# Patient Record
Sex: Female | Born: 1970 | Race: Black or African American | Hispanic: No | Marital: Married | Smoking: Former smoker
Health system: Southern US, Community
[De-identification: ages and names within clinical notes are randomized; demographics above are authoritative.]

## PROBLEM LIST (undated history)

## (undated) DIAGNOSIS — I10 Essential (primary) hypertension: Secondary | ICD-10-CM

## (undated) DIAGNOSIS — E669 Obesity, unspecified: Secondary | ICD-10-CM

## (undated) DIAGNOSIS — Z973 Presence of spectacles and contact lenses: Secondary | ICD-10-CM

## (undated) HISTORY — PX: CHOLECYSTECTOMY: SHX55

## (undated) HISTORY — DX: Obesity, unspecified: E66.9

## (undated) HISTORY — DX: Essential (primary) hypertension: I10

## (undated) HISTORY — PX: TUBAL LIGATION: SHX77

---

## 1997-08-11 ENCOUNTER — Ambulatory Visit (HOSPITAL_COMMUNITY): Admission: RE | Admit: 1997-08-11 | Discharge: 1997-08-12 | Payer: Self-pay | Admitting: Surgery

## 1997-11-15 ENCOUNTER — Emergency Department (HOSPITAL_COMMUNITY): Admission: EM | Admit: 1997-11-15 | Discharge: 1997-11-15 | Payer: Self-pay | Admitting: Emergency Medicine

## 1997-11-18 ENCOUNTER — Emergency Department (HOSPITAL_COMMUNITY): Admission: EM | Admit: 1997-11-18 | Discharge: 1997-11-18 | Payer: Self-pay | Admitting: *Deleted

## 1997-11-23 ENCOUNTER — Encounter: Admission: RE | Admit: 1997-11-23 | Discharge: 1997-11-23 | Payer: Self-pay | Admitting: Internal Medicine

## 1997-12-01 ENCOUNTER — Ambulatory Visit (HOSPITAL_COMMUNITY): Admission: RE | Admit: 1997-12-01 | Discharge: 1997-12-01 | Payer: Self-pay | Admitting: *Deleted

## 1997-12-04 ENCOUNTER — Encounter: Admission: RE | Admit: 1997-12-04 | Discharge: 1997-12-04 | Payer: Self-pay | Admitting: Internal Medicine

## 1998-08-31 ENCOUNTER — Other Ambulatory Visit: Admission: RE | Admit: 1998-08-31 | Discharge: 1998-08-31 | Payer: Self-pay | Admitting: Obstetrics and Gynecology

## 1998-12-17 ENCOUNTER — Encounter: Admission: RE | Admit: 1998-12-17 | Discharge: 1999-03-17 | Payer: Self-pay | Admitting: Obstetrics and Gynecology

## 1999-01-12 ENCOUNTER — Inpatient Hospital Stay (HOSPITAL_COMMUNITY): Admission: AD | Admit: 1999-01-12 | Discharge: 1999-01-12 | Payer: Self-pay | Admitting: Obstetrics and Gynecology

## 1999-02-17 ENCOUNTER — Inpatient Hospital Stay (HOSPITAL_COMMUNITY): Admission: AD | Admit: 1999-02-17 | Discharge: 1999-02-17 | Payer: Self-pay | Admitting: Obstetrics & Gynecology

## 1999-02-19 ENCOUNTER — Inpatient Hospital Stay (HOSPITAL_COMMUNITY): Admission: AD | Admit: 1999-02-19 | Discharge: 1999-02-22 | Payer: Self-pay | Admitting: Obstetrics and Gynecology

## 1999-02-19 ENCOUNTER — Encounter (INDEPENDENT_AMBULATORY_CARE_PROVIDER_SITE_OTHER): Payer: Self-pay

## 2000-01-23 ENCOUNTER — Ambulatory Visit (HOSPITAL_COMMUNITY): Admission: RE | Admit: 2000-01-23 | Discharge: 2000-01-23 | Payer: Self-pay | Admitting: Infectious Diseases

## 2000-01-23 ENCOUNTER — Encounter: Payer: Self-pay | Admitting: Infectious Diseases

## 2000-07-24 ENCOUNTER — Encounter: Admission: RE | Admit: 2000-07-24 | Discharge: 2000-07-24 | Payer: Self-pay | Admitting: Family Medicine

## 2000-07-31 ENCOUNTER — Encounter: Admission: RE | Admit: 2000-07-31 | Discharge: 2000-07-31 | Payer: Self-pay | Admitting: Sports Medicine

## 2000-07-31 ENCOUNTER — Encounter: Payer: Self-pay | Admitting: Sports Medicine

## 2000-08-11 ENCOUNTER — Encounter: Payer: Self-pay | Admitting: Sports Medicine

## 2000-08-11 ENCOUNTER — Encounter: Admission: RE | Admit: 2000-08-11 | Discharge: 2000-08-11 | Payer: Self-pay | Admitting: Sports Medicine

## 2005-04-24 ENCOUNTER — Emergency Department (HOSPITAL_COMMUNITY): Admission: EM | Admit: 2005-04-24 | Discharge: 2005-04-24 | Payer: Self-pay | Admitting: Family Medicine

## 2005-06-10 ENCOUNTER — Ambulatory Visit: Payer: Self-pay | Admitting: Family Medicine

## 2005-06-17 ENCOUNTER — Encounter: Admission: RE | Admit: 2005-06-17 | Discharge: 2005-06-17 | Payer: Self-pay | Admitting: Sports Medicine

## 2005-07-02 ENCOUNTER — Ambulatory Visit: Payer: Self-pay | Admitting: Sports Medicine

## 2005-07-10 ENCOUNTER — Ambulatory Visit: Payer: Self-pay | Admitting: Family Medicine

## 2005-07-18 ENCOUNTER — Encounter: Admission: RE | Admit: 2005-07-18 | Discharge: 2005-07-18 | Payer: Self-pay | Admitting: Family Medicine

## 2005-07-23 ENCOUNTER — Ambulatory Visit: Payer: Self-pay | Admitting: Sports Medicine

## 2006-02-25 ENCOUNTER — Emergency Department (HOSPITAL_COMMUNITY): Admission: EM | Admit: 2006-02-25 | Discharge: 2006-02-25 | Payer: Self-pay | Admitting: Emergency Medicine

## 2006-02-28 ENCOUNTER — Emergency Department (HOSPITAL_COMMUNITY): Admission: EM | Admit: 2006-02-28 | Discharge: 2006-02-28 | Payer: Self-pay | Admitting: Family Medicine

## 2006-05-01 ENCOUNTER — Ambulatory Visit: Payer: Self-pay | Admitting: Family Medicine

## 2006-05-14 DIAGNOSIS — I10 Essential (primary) hypertension: Secondary | ICD-10-CM

## 2006-05-14 DIAGNOSIS — E1165 Type 2 diabetes mellitus with hyperglycemia: Secondary | ICD-10-CM

## 2006-05-14 DIAGNOSIS — E119 Type 2 diabetes mellitus without complications: Secondary | ICD-10-CM | POA: Insufficient documentation

## 2006-05-14 DIAGNOSIS — E669 Obesity, unspecified: Secondary | ICD-10-CM

## 2006-05-27 LAB — CONVERTED CEMR LAB: Pap Smear: NORMAL

## 2006-07-01 ENCOUNTER — Encounter: Admission: RE | Admit: 2006-07-01 | Discharge: 2006-07-01 | Payer: Self-pay | Admitting: Sports Medicine

## 2006-07-03 ENCOUNTER — Encounter (INDEPENDENT_AMBULATORY_CARE_PROVIDER_SITE_OTHER): Payer: Self-pay | Admitting: *Deleted

## 2006-08-14 ENCOUNTER — Ambulatory Visit: Payer: Self-pay | Admitting: Family Medicine

## 2006-08-25 ENCOUNTER — Telehealth: Payer: Self-pay | Admitting: *Deleted

## 2006-08-25 ENCOUNTER — Ambulatory Visit: Payer: Self-pay | Admitting: Family Medicine

## 2006-08-28 ENCOUNTER — Ambulatory Visit: Payer: Self-pay | Admitting: Family Medicine

## 2006-08-31 ENCOUNTER — Telehealth: Payer: Self-pay | Admitting: *Deleted

## 2006-08-31 ENCOUNTER — Ambulatory Visit: Payer: Self-pay | Admitting: Family Medicine

## 2006-08-31 ENCOUNTER — Encounter: Payer: Self-pay | Admitting: *Deleted

## 2006-08-31 ENCOUNTER — Encounter (INDEPENDENT_AMBULATORY_CARE_PROVIDER_SITE_OTHER): Payer: Self-pay | Admitting: *Deleted

## 2006-08-31 LAB — CONVERTED CEMR LAB
ALT: <8 units/L (ref 0–35)
BUN: 9 mg/dL (ref 6–23)
CO2: 21 meq/L (ref 19–32)
Calcium: 8.9 mg/dL (ref 8.4–10.5)
Chloride: 104 meq/L (ref 96–112)
Creatinine, Ser: 0.72 mg/dL (ref 0.40–1.20)
Glucose, Bld: 193 mg/dL — ABNORMAL HIGH (ref 70–99)
LDL Cholesterol: 95 mg/dL (ref 0–99)
Potassium: 4.2 meq/L (ref 3.5–5.3)
TSH: 0.797 microintl units/mL (ref 0.350–5.50)
Total Bilirubin: 0.3 mg/dL (ref 0.3–1.2)
Total CHOL/HDL Ratio: 3.6
VLDL: 21 mg/dL (ref 0–40)

## 2006-09-01 ENCOUNTER — Encounter (INDEPENDENT_AMBULATORY_CARE_PROVIDER_SITE_OTHER): Payer: Self-pay | Admitting: *Deleted

## 2006-09-02 ENCOUNTER — Telehealth: Payer: Self-pay | Admitting: *Deleted

## 2006-09-08 ENCOUNTER — Encounter: Payer: Self-pay | Admitting: Vascular Surgery

## 2006-09-08 ENCOUNTER — Ambulatory Visit (HOSPITAL_COMMUNITY): Admission: RE | Admit: 2006-09-08 | Discharge: 2006-09-08 | Payer: Self-pay | Admitting: Sports Medicine

## 2006-09-09 ENCOUNTER — Encounter (INDEPENDENT_AMBULATORY_CARE_PROVIDER_SITE_OTHER): Payer: Self-pay | Admitting: *Deleted

## 2007-04-29 ENCOUNTER — Emergency Department (HOSPITAL_COMMUNITY): Admission: EM | Admit: 2007-04-29 | Discharge: 2007-04-29 | Payer: Self-pay | Admitting: Family Medicine

## 2007-09-23 ENCOUNTER — Ambulatory Visit: Payer: Self-pay | Admitting: Family Medicine

## 2007-10-06 ENCOUNTER — Ambulatory Visit: Payer: Self-pay | Admitting: Family Medicine

## 2007-10-07 ENCOUNTER — Encounter: Payer: Self-pay | Admitting: Sports Medicine

## 2007-10-18 ENCOUNTER — Encounter: Payer: Self-pay | Admitting: *Deleted

## 2007-11-25 ENCOUNTER — Emergency Department (HOSPITAL_COMMUNITY): Admission: EM | Admit: 2007-11-25 | Discharge: 2007-11-25 | Payer: Self-pay | Admitting: Family Medicine

## 2007-11-30 ENCOUNTER — Encounter: Payer: Self-pay | Admitting: *Deleted

## 2008-01-24 ENCOUNTER — Emergency Department (HOSPITAL_COMMUNITY): Admission: EM | Admit: 2008-01-24 | Discharge: 2008-01-24 | Payer: Self-pay | Admitting: Family Medicine

## 2008-01-24 LAB — CONVERTED CEMR LAB
Creatinine, Ser: 0.8 mg/dL
Potassium: 3.8 meq/L

## 2008-01-26 ENCOUNTER — Telehealth: Payer: Self-pay | Admitting: *Deleted

## 2008-01-27 ENCOUNTER — Encounter: Payer: Self-pay | Admitting: *Deleted

## 2008-01-27 ENCOUNTER — Ambulatory Visit: Payer: Self-pay | Admitting: Family Medicine

## 2008-01-27 ENCOUNTER — Ambulatory Visit (HOSPITAL_COMMUNITY): Admission: RE | Admit: 2008-01-27 | Discharge: 2008-01-27 | Payer: Self-pay | Admitting: Family Medicine

## 2008-01-27 ENCOUNTER — Encounter: Payer: Self-pay | Admitting: Sports Medicine

## 2008-01-27 LAB — CONVERTED CEMR LAB
Cholesterol: 180 mg/dL (ref 0–200)
HDL: 48 mg/dL (ref >39)
Hgb A1c MFr Bld: 10.5 %
LDL Cholesterol: 110 mg/dL — ABNORMAL HIGH (ref 0–99)
Microalbumin U total vol: NORMAL mg/L
Total CHOL/HDL Ratio: 3.8
Triglycerides: 110 mg/dL (ref <150)
VLDL: 22 mg/dL (ref 0–40)

## 2008-02-04 ENCOUNTER — Ambulatory Visit: Payer: Self-pay | Admitting: Family Medicine

## 2008-02-04 ENCOUNTER — Encounter: Payer: Self-pay | Admitting: Sports Medicine

## 2008-03-06 ENCOUNTER — Encounter: Payer: Self-pay | Admitting: Sports Medicine

## 2008-03-06 DIAGNOSIS — E785 Hyperlipidemia, unspecified: Secondary | ICD-10-CM

## 2008-09-01 ENCOUNTER — Emergency Department (HOSPITAL_COMMUNITY): Admission: EM | Admit: 2008-09-01 | Discharge: 2008-09-01 | Payer: Self-pay | Admitting: Family Medicine

## 2008-12-11 ENCOUNTER — Telehealth: Payer: Self-pay | Admitting: Sports Medicine

## 2008-12-21 ENCOUNTER — Ambulatory Visit: Payer: Self-pay | Admitting: Family Medicine

## 2008-12-21 ENCOUNTER — Encounter: Payer: Self-pay | Admitting: Sports Medicine

## 2008-12-21 LAB — CONVERTED CEMR LAB: Hgb A1c MFr Bld: 9.9 %

## 2009-01-03 ENCOUNTER — Encounter: Payer: Self-pay | Admitting: *Deleted

## 2009-01-03 ENCOUNTER — Ambulatory Visit: Payer: Self-pay | Admitting: Family Medicine

## 2009-05-31 ENCOUNTER — Encounter: Payer: Self-pay | Admitting: Sports Medicine

## 2009-12-07 ENCOUNTER — Encounter: Payer: Self-pay | Admitting: Family Medicine

## 2009-12-07 ENCOUNTER — Ambulatory Visit: Payer: Self-pay | Admitting: Family Medicine

## 2009-12-07 LAB — CONVERTED CEMR LAB
Epithelial cells, urine: <5 /lpf
Glucose, Urine, Semiquant: 500
Protein, U semiquant: 100

## 2009-12-09 LAB — CONVERTED CEMR LAB
Basophils Absolute: 0 10*3/uL (ref 0.0–0.1)
Basophils Relative: 0 % (ref 0–1)
CO2: 24 meq/L (ref 19–32)
Chloride: 101 meq/L (ref 96–112)
Creatinine, Ser: 0.72 mg/dL (ref 0.40–1.20)
Eosinophils Absolute: 0 10*3/uL (ref 0.0–0.7)
Eosinophils Relative: 0 % (ref 0–5)
Lymphocytes Relative: 16 % (ref 12–46)
MCV: 74.6 fL — ABNORMAL LOW (ref 78.0–100.0)
Monocytes Absolute: 0.6 10*3/uL (ref 0.1–1.0)
Monocytes Relative: 7 % (ref 3–12)
Neutrophils Relative %: 76 % (ref 43–77)
Platelets: 213 10*3/uL (ref 150–400)
RDW: 16.4 % — ABNORMAL HIGH (ref 11.5–15.5)
WBC: 7.9 10*3/uL (ref 4.0–10.5)

## 2009-12-10 ENCOUNTER — Telehealth: Payer: Self-pay | Admitting: Sports Medicine

## 2009-12-10 ENCOUNTER — Encounter: Payer: Self-pay | Admitting: Family Medicine

## 2009-12-10 ENCOUNTER — Telehealth: Payer: Self-pay | Admitting: *Deleted

## 2009-12-10 ENCOUNTER — Ambulatory Visit: Payer: Self-pay | Admitting: Family Medicine

## 2009-12-10 ENCOUNTER — Telehealth: Payer: Self-pay | Admitting: Family Medicine

## 2010-01-21 ENCOUNTER — Encounter: Payer: Self-pay | Admitting: Sports Medicine

## 2010-02-01 ENCOUNTER — Ambulatory Visit: Payer: Self-pay | Admitting: Family Medicine

## 2010-02-01 ENCOUNTER — Encounter: Payer: Self-pay | Admitting: Sports Medicine

## 2010-02-01 DIAGNOSIS — N760 Acute vaginitis: Secondary | ICD-10-CM | POA: Insufficient documentation

## 2010-02-01 DIAGNOSIS — N912 Amenorrhea, unspecified: Secondary | ICD-10-CM

## 2010-02-01 LAB — CONVERTED CEMR LAB
ALT: 9 units/L (ref 0–35)
AST: 9 units/L (ref 0–37)
Albumin: 4.2 g/dL (ref 3.5–5.2)
Alkaline Phosphatase: 140 units/L — ABNORMAL HIGH (ref 39–117)
BUN: 19 mg/dL (ref 6–23)
Beta hcg, urine, semiquantitative: NEGATIVE
Bilirubin Urine: NEGATIVE
Blood in Urine, dipstick: NEGATIVE
CO2: 25 meq/L (ref 19–32)
Calcium: 9.6 mg/dL (ref 8.4–10.5)
Chloride: 95 meq/L — ABNORMAL LOW (ref 96–112)
Creatinine, Ser: 0.83 mg/dL (ref 0.40–1.20)
DHEA-SO4: 110 ug/dL (ref 35–430)
Direct LDL: 110 mg/dL — ABNORMAL HIGH
Estradiol: 44.2 pg/mL
FSH: 5 m[IU]/mL
Glucose, Bld: 479 mg/dL — ABNORMAL HIGH (ref 70–99)
Glucose, Urine, Semiquant: 500
HCT: 36.9 % (ref 36.0–46.0)
Hemoglobin: 12 g/dL (ref 12.0–15.0)
Hgb A1c MFr Bld: 13.3 %
Ketones, urine, test strip: NEGATIVE
LH: 5.5 m[IU]/mL
MCHC: 32.5 g/dL (ref 30.0–36.0)
MCV: 75.8 fL — ABNORMAL LOW (ref 78.0–100.0)
Nitrite: NEGATIVE
Pap Smear: NEGATIVE
Platelets: 320 10*3/uL (ref 150–400)
Potassium: 4.1 meq/L (ref 3.5–5.3)
Progesterone: 2.8 ng/mL
Protein, U semiquant: NEGATIVE
RBC: 4.87 M/uL (ref 3.87–5.11)
RDW: 16.3 % — ABNORMAL HIGH (ref 11.5–15.5)
Sodium: 131 meq/L — ABNORMAL LOW (ref 135–145)
Specific Gravity, Urine: 1.015
TSH: 0.841 microintl units/mL (ref 0.350–4.500)
Testosterone: 26.26 ng/dL (ref 10–70)
Total Bilirubin: 0.3 mg/dL (ref 0.3–1.2)
Total Protein: 7.8 g/dL (ref 6.0–8.3)
Urobilinogen, UA: 0.2
WBC Urine, dipstick: NEGATIVE
WBC: 8.2 10*3/uL (ref 4.0–10.5)
Whiff Test: NEGATIVE
pH: 5.5

## 2010-02-06 ENCOUNTER — Encounter: Admission: RE | Admit: 2010-02-06 | Discharge: 2010-02-06 | Payer: Self-pay | Admitting: Sports Medicine

## 2010-02-14 ENCOUNTER — Ambulatory Visit: Payer: Self-pay | Admitting: Family Medicine

## 2010-02-14 DIAGNOSIS — N841 Polyp of cervix uteri: Secondary | ICD-10-CM

## 2010-02-20 ENCOUNTER — Encounter
Admission: RE | Admit: 2010-02-20 | Discharge: 2010-02-20 | Payer: Self-pay | Source: Home / Self Care | Admitting: Sports Medicine

## 2010-02-27 ENCOUNTER — Ambulatory Visit: Payer: Self-pay

## 2010-04-03 ENCOUNTER — Encounter: Payer: Self-pay | Admitting: Sports Medicine

## 2010-04-18 NOTE — Letter (Signed)
Summary: Out of Work  [facility]  [address]   [place], [place] [address]   Phone: [phone]  Fax: [phone]    December 07, 2009   Employee:  Virginia [name]    To Whom It May Concern:   For Medical reasons, please excuse the above named employee from work for the following dates:  Start:   12-07-09    End:   12-10-09  If you need additional information, please feel free to contact our office.         Sincerely,    Jamie Brookes MD

## 2010-04-18 NOTE — Letter (Signed)
Summary: Generic Letter  [facility]  [address]   [place], [place] [address]   Phone: [phone]  Fax: [phone]    05/31/2009  Breahna Ferrell [address] [place], [place]  [address]  Dear Ms. Langill,   You are overdue for some preventive medical tests.  Please make an appt to see me as soon as is convenient for you.     Sincerely,   Rodney Langton MD

## 2010-04-18 NOTE — Letter (Signed)
Summary: Out of Work  [facility]  [address]   [place], [place] [address]   Phone: [phone]  Fax: [phone]    December 10, 2009   Employee:  Nakeya C Lukas    To Whom It May Concern:   For Medical reasons, please excuse the above named employee from work for the following dates:  Start:  December 10, 2009    End:   December 17, 2009   If you need additional information, please feel free to contact our office.         Sincerely,    Ellery Plunk MD

## 2010-04-18 NOTE — Progress Notes (Signed)
Summary: re: urine  ---- Converted from flag ---- ---- 12/09/2009 3:56 PM, Jamie Brookes MD wrote: Please let this patient know that she is growing E. coli in her urine and is on a good medicine for that so continue taking her full dose of the antibiotic. Thank you. ------------------------------  informed pt at visit today

## 2010-04-18 NOTE — Consult Note (Signed)
Summary: [name] Gl[facility]  [facility]   Imported By: Knox Royalty 04/11/2010 10:58:22  _____________________________________________________________________  External Attachment:    Type:   Image     Comment:   External Document

## 2010-04-18 NOTE — Progress Notes (Signed)
Summary: triage   Phone Note Call from Patient Call back at Home Phone [phone]   Caller: Patient Summary of Call: Pt seen Friday for UTI and now back and her bottom of her stomach is hurting really bad. Initial call taken by: Clydell Hakim,  December 10, 2009 9:19 AM  Follow-up for Phone Call        states she is feeling better but still has " soreness"  in "midsection & back' temp 98.3. has been taking tylenol & ibu all weekend. is in bed in a lot of pain. she will come in now. aware of wait time Follow-up by: Golden Circle RN,  December 10, 2009 9:28 AM  Additional Follow-up for Phone Call Additional follow up Details #1::        Seen today. Additional Follow-up by: Rodney Langton MD,  December 10, 2009 1:45 PM

## 2010-04-18 NOTE — Assessment & Plan Note (Signed)
Summary: colpo per dr neal/eo   Vital Signs:  Patient profile:   40 year old female Height:      66 inches Weight:      233.1 pounds BMI:     37.76 Temp:     98.0 degrees F oral Pulse rate:   78 / minute BP sitting:   125 / 80  (left arm) Cuff size:   large  Vitals Entered By: Garen Grams LPN (February 14, 2010 8:49 AM) CC: colpo Is Patient Diabetic? Yes Did you bring your meter with you today? No Pain Assessment Patient in pain? no        Primary Care Provider:  Rodney Langton MD  CC:  colpo.  History of Present Illness: 40 yo female here for f/u of recent vaginal ultrasounds and to discuss results. Pt reports that menstration has resumed, she had skipped one period last month.  Her period started on 02-07-10, normal flow.   Reviewed transvaginal u/s from 02-06-10 and a small posterior fibroid was noted, as well as a normal endometrium, normal right ovary and a non-visulaized left ovary.  Pt denies abd pain, no abnormal bleeding.  Had a tubal ligation about 12 years ago.  No family hx of cervical cancer.  No abn ormal paps.  Pap from 02-01-10 was NEGATIVE FOR INTRAEPITHELIAL LESIONS OR MALIGNANCY.  Habits & Providers  Alcohol-Tobacco-Diet     Tobacco Status: quit     Year Quit: 2003  Current Medications (verified): 1)  Lisinopril-Hydrochlorothiazide 20-25 Mg Tabs (Lisinopril-Hydrochlorothiazide) .... One Tab By Mouth Daily 2)  Glipizide 10 Mg Tabs (Glipizide) .... One Tab By Mouth Daily 3)  Adult Aspirin Ec Low Strength 81 Mg  Tbec (Aspirin) .... One Tab By Mouth Daily For Heart Protection. 4)  Meclizine Hcl 25 Mg Tabs (Meclizine Hcl) .Marland Kitchen.. 1 Tab By Mouth Two Times A Day 5)  Amlodipine Besylate 5 Mg Tabs (Amlodipine Besylate) .... One Tab By Mouth Daily  Allergies: 1)  ! * Shellfish  Review of Systems  The patient denies fever and weight loss.         no more amenorrhea  Physical Exam  General:  alert and overweight-appearing.   Genitalia:  normal  introitus, no external lesions, no vaginal discharge, and mucosa pink and moist.  typical appearing cervical polyp.   Impression & Recommendations:  Problem # 1:  CERVICAL POLYP (ICD-622.7)  typical in appearance--we discussed extensively spending > 50% 40 minute ov in education and counseling re polyps patient did not desire removal  Orders: [facility]- Est  Level 4 (91478)  Problem # 2:  AMENORRHEA (ICD-626.0) Assessment: Improved  resolved  Orders: [facility]- Est  Level 4 (29562)  Complete Medication List: 1)  Lisinopril-hydrochlorothiazide 20-25 Mg Tabs (Lisinopril-hydrochlorothiazide) .... One tab by mouth daily 2)  Glipizide 10 Mg Tabs (Glipizide) .... One tab by mouth daily 3)  Adult Aspirin Ec Low Strength 81 Mg Tbec (Aspirin) .... One tab by mouth daily for heart protection. 4)  Meclizine Hcl 25 Mg Tabs (Meclizine hcl) .Marland Kitchen.. 1 tab by mouth two times a day 5)  Amlodipine Besylate 5 Mg Tabs (Amlodipine besylate) .... One tab by mouth daily   Orders Added: 1)  [facility]- Est  Level 4 [13086]

## 2010-04-18 NOTE — Assessment & Plan Note (Signed)
 Summary: cpe/kh   Vital Signs:  Patient Profile:   40 Years Old Female Height:     66 inches Weight:      258 pounds Temp:     98.3 degrees F Pulse rate:   97 / minute BP sitting:   149 / 77  Pt. in pain?   yes    Location:   left heel    Intensity:   5  Vitals Entered By: HARLENE CARTE CMA (October 06, 2007 2:48 PM)                  PCP:  DEBBY PETTIES MD  Chief Complaint:  CPE.  History of Present Illness: Pt has pain in left heel, increasing since pt started a new exercise regimen of powerwalking 6 laps around a 1/4 mile track 4 times per week (started in May 2009).  Pain is prominent on lateral, medial, and posterior heel, but not on bottom.  Exacerbated by walking in the morning but not by plantarflexing with weight bearing.  Improved with Ibuprofin.  Pt was also scheduled for CPE and pap smear(last pap 2 years ago, normal) but had to leave to pick up her children from school before this could be done.  I instructed pt to reschedule to see me next week.     Past Medical History:    Anemia with low MCV    No history of STDs or abnormal Paps    HTN    DMII   Family History:    2 older brothers are healthy, Father - HTN, lipids, Mother - Breast CA, DM, CHF, Htn, osteoporosis    Review of Systems       As above in HPI   Physical Exam  Extremities:     Left ankle pain laterally, medially, and posteriorly, non-swollen, good ROM, no deformities.    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  Pt will have Xray 10/07/07 and will see me in one week to discuss results.   Orders: Diagnostic X-Ray/Fluoroscopy (Diagnostic X-Ray/Flu) [facility]- Est Level  3 (00786)   Problem # 2:  DIABETES MELLITUS II, UNCOMPLICATED (ICD-250.00) Added ASA 81 mg by mouth daily for cardioprotection Will do Urine microalbumin, A1c, lipid profile, ECG and have patient see me next week  to discuss results.  If high, will discuss dieting, weight loss, and possible medication changes.   Her updated medication list for this problem includes:    Lisinopril  20 Mg Tabs (Lisinopril ) .SABRA... 1 tablet by mouth daily no more refills until seen at [facility]    Glipizide -metformin  Hcl 5-500 Mg Tabs (Glipizide -metformin  hcl) ..... One tab by mouth two times a day  no more refills until seen at [facility]    Adult Aspirin Ec Low Strength 81 Mg Tbec (Aspirin) ..... One tab by mouth daily for heart protection.  Future Orders: A1C-[facility] (16963) ... 10/07/2007 UA Microalbumin-[facility] (17955) ... 10/07/2007 Lipid-[facility] (19938-77069) ... 10/07/2007 12 Lead EKG (12 Lead EKG) ... 10/07/2007   Problem # 3:  HYPERTENSION, BENIGN SYSTEMIC (ICD-401.1) I will see the patient next week to discuss her high blood pressure.  We will discuss diet, weight loss, and medications to control pressure.   Her updated medication list for this problem includes:    Lisinopril  20 Mg Tabs (Lisinopril ) .SABRA... 1 tablet by mouth daily no more refills until seen at [facility]   Complete Medication List: 1)  Lisinopril   20 Mg Tabs (Lisinopril ) .SABRA.. 1 tablet by mouth daily no more refills until seen at [facility] 2)  Glipizide -metformin  Hcl 5-500 Mg Tabs (Glipizide -metformin  hcl) .... One tab by mouth two times a day  no more refills until seen at [facility] 3)  Adult Aspirin Ec Low Strength 81 Mg Tbec (Aspirin) .... One tab by mouth daily for heart protection.  Diabetes Management Assessment/Plan:      The following lipid goals have been established for the patient: Total cholesterol goal of 200; LDL cholesterol goal of 100; HDL cholesterol goal of 40; Triglyceride goal of 200.     Patient Instructions: 1)  It was good to meet you today.  I would like you to make an appointment to see me next week, please let the office staff know its ok to double book.  I will do your complete physical then.  I would also like you to make an appointment to have your blood  drawn tomorrow for hemoglobin A1C and to have an X-ray of your foot/ankle 3-view.     Prescriptions: ADULT ASPIRIN EC LOW STRENGTH 81 MG  TBEC (ASPIRIN) One tab by mouth daily for heart protection.  #30 x 3   Entered and Authorized by:   DEBBY PETTIES MD   Signed by:   DEBBY PETTIES MD on 10/06/2007   Method used:   Electronically sent to ...       [id number]       [address]       [place], [place]  [address]       Ph: [phone] or [phone]       Fax: [phone]   RxID:   [id number]  ]

## 2010-04-18 NOTE — Letter (Signed)
Summary: Out of Work  [facility]  [address]   [place], [place] [address]   Phone: [phone]  Fax: [phone]    December 10, 2009   Employee:  [name]    To Whom It May Concern:   For Medical reasons, please excuse the above named employee from work for the following dates:  Start:  December 10, 2009   End:  December 14, 2009   If you need additional information, please feel free to contact our office.         Sincerely,    Ellery Plunk MD

## 2010-04-18 NOTE — Assessment & Plan Note (Signed)
Summary: UTI, DM2   Vital Signs:  Patient profile:   40 year old female Weight:      239.7 pounds BMI:     38.83 Temp:     99.6 degrees F oral Pulse rate:   110 / minute Pulse rhythm:   regular BP sitting:   155 / 87  (right arm) Cuff size:   large  Vitals Entered By: Loralee Pacas CMA (December 07, 2009 10:18 AM)  Primary Care Provider:  Rodney Langton MD  CC:  body aches.  History of Present Illness: Body aches: Pt has had body aches for the last 1 day. She had a fever last night of 100.3. She took ibuprofen before she went to bed but this morning she woke up feeling sick still. She has no sick contacts. No new meds. she is having very dark urine and is not drinking much for the last 1 day. She has been feeling lightheaded, nauseated but has not vomited.   Habits & Providers  Alcohol-Tobacco-Diet     Tobacco Status: current  Current Medications (verified): 1)  Lisinopril-Hydrochlorothiazide 20-25 Mg Tabs (Lisinopril-Hydrochlorothiazide) .... One Tab By Mouth Daily 2)  Glipizide 10 Mg Tabs (Glipizide) .... One Tab By Mouth Daily 3)  Adult Aspirin Ec Low Strength 81 Mg  Tbec (Aspirin) .... One Tab By Mouth Daily For Heart Protection. 4)  Meclizine Hcl 25 Mg Tabs (Meclizine Hcl) .Marland Kitchen.. 1 Tab By Mouth Two Times A Day 5)  Amlodipine Besylate 5 Mg Tabs (Amlodipine Besylate) .... One Tab By Mouth Daily 6)  Keflex 500 Mg Caps (Cephalexin) .... Take 1 Pill Twice Daily For 10 Days.  Allergies (verified): 1)  ! * Shellfish  Social History: Smoking Status:  current  Review of Systems        vitals reviewed and pertinent negatives and positives seen in HPI   Physical Exam  General:  Well-developed,well-nourished,pt appears ill; alert,appropriate and cooperative throughout examination Abdomen:  Bowel sounds positive,abdomen soft and non-tender without masses, organomegaly or hernias noted. Msk:  slight CVA tenderness on bilateral sides.    Impression &  Recommendations:  Problem # 1:  DYSURIA (ICD-788.1) Assessment New Concern for UTI vs Pyelo vs viral influenza infection or a combination of those. UA was impressive for a UTI, Urine is being sent for culture. Pt is menstrating so there is blood in the urine. Plan to get CBC to see if she has a WBC elevated more consistant with Pyelo. Pt is to come back on MOnday if not feeling better or call the after hours line over the weekend.  If pt will having body aches and pain even while on the ABx then I would think she had a viral influenza as well. She has not gotten a flu shot this year.   Her updated medication list for this problem includes:    Keflex 500 Mg Caps (Cephalexin) .Marland Kitchen... Take 1 pill twice daily for 10 days.  Orders: Urinalysis-[facility] (00000) CBC w/Diff-[facility] (04540) Basic Met-[facility] ([address]8119-14782) Urine Culture-[facility] ([address]21-30865) [facility]- Est  Level 4 (78469)  Problem # 2:  DIABETES MELLITUS II, UNCOMPLICATED (ICD-250.00) Assessment: Deteriorated   Pt is a diabetic who has not been taking her meds the last few days. Her blood glucose was 300's so I don't believe she is in a dangerous zone but encouraged to start back on her DM2 meds.   The following medications were removed from the medication list:    Metformin Hcl 1000 Mg Tabs (Metformin hcl) ..... One tab by  mouth two times a day Her updated medication list for this problem includes:    Lisinopril-hydrochlorothiazide 20-25 Mg Tabs (Lisinopril-hydrochlorothiazide) ..... One tab by mouth daily    Glipizide 10 Mg Tabs (Glipizide) ..... One tab by mouth daily    Adult Aspirin Ec Low Strength 81 Mg Tbec (Aspirin) ..... One tab by mouth daily for heart protection.  Orders: Glucose Cap-[facility] ([address]) [facility]- Est  Level 4 (60454)  Complete Medication List: 1)  Lisinopril-hydrochlorothiazide 20-25 Mg Tabs (Lisinopril-hydrochlorothiazide) .... One tab by mouth daily 2)  Glipizide 10 Mg Tabs (Glipizide) .... One tab by mouth daily 3)  Adult Aspirin  Ec Low Strength 81 Mg Tbec (Aspirin) .... One tab by mouth daily for heart protection. 4)  Meclizine Hcl 25 Mg Tabs (Meclizine hcl) .Marland Kitchen.. 1 tab by mouth two times a day 5)  Amlodipine Besylate 5 Mg Tabs (Amlodipine besylate) .... One tab by mouth daily 6)  Keflex 500 Mg Caps (Cephalexin) .... Take 1 pill twice daily for 10 days.  Patient Instructions: 1)  Call Monday to set up appointment if no better.  2)  We will call you with lab results.  Prescriptions: GLIPIZIDE 10 MG TABS (GLIPIZIDE) One tab by mouth daily  #90 x 0   Entered and Authorized by:   Jamie Brookes MD   Signed by:   Jamie Brookes MD on 12/07/2009   Method used:   Electronically to        [facility] [address] (retail)       [address]       [place], [place]  [id number]       Ph: [phone]       Fax: [phone]   RxID:   [id number] KEFLEX 500 MG CAPS (CEPHALEXIN) take 1 pill twice daily for 10 days.  #20 x 0   Entered and Authorized by:   Jamie Brookes MD   Signed by:   Jamie Brookes MD on 12/07/2009   Method used:   Electronically to        [facility] [address] (retail)       [address]       [place], [place]  [id number]       Ph: [phone]       Fax: [phone]   RxID:   [id number]   Laboratory Results   Urine Tests  Date/Time Received: December 07, 2009 11:44 AM  Date/Time Reported: December 07, 2009 11:57 AM   Routine Urinalysis   Color: yellow Appearance: Cloudy Glucose: 500   (Normal Range: Negative) Bilirubin: negative   (Normal Range: Negative) Ketone: >= (160)   (Normal Range: Negative) Spec. Gravity: 1.025   (Normal Range: 1.003-1.035) Blood: large   (Normal Range: Negative) pH: 5.5   (Normal Range: 5.0-8.0) Protein: 100   (Normal Range: Negative) Urobilinogen: 1.0   (Normal Range: 0-1) Nitrite: positive   (Normal Range: Negative) Leukocyte Esterace: moderate   (Normal Range: Negative)  Urine Microscopic WBC/HPF:  LOADED RBC/HPF: 10-20 Bacteria/HPF: 3+ Epithelial/HPF: <5    Comments: ...............test performed by......Marland KitchenBonnie A. [place], MLS (ASCP)cm

## 2010-04-18 NOTE — Progress Notes (Signed)
Summary: Note Needed   Phone Note Call from Patient Call back at Home Phone [phone]   Caller: Patient Summary of Call: Saw Dr. Hulen Luster today and wondering if the note that she wrote her out for till Friday could be changed until Monday.   Initial call taken by: Clydell Hakim,  December 10, 2009 12:29 PM  Follow-up for Phone Call        new note written Follow-up by: Ellery Plunk MD,  December 10, 2009 1:40 PM

## 2010-04-18 NOTE — Assessment & Plan Note (Signed)
 Summary: f/u meds,df   Vital Signs:  Patient profile:   40 year old female Weight:      258.0 pounds Temp:     98.7 degrees F oral Pulse rate:   88 / minute BP sitting:   163 / 95  (left arm)  Vitals Entered By: Letitia Reusing (December 21, 2008 8:32 AM) CC: f/u meds Is Patient Diabetic? Yes   Primary Care Provider:  DEBBY PETTIES MD  CC:  f/u meds.  History of Present Illness: 22F with HTN, DM2, Vertigo, obesity here for follow up.  Dizziness: Resolved, no longer needs meclizine   HTN:  Deteriorated.  Has been taking meds per pt.  DM2: HbA1c 10.5 at last visit, 9.9 today.  Taking meds per pt.  Obesity:  has gained 4 lbs since last visit.  PAP:  Due, last 2 yrs ago, nl per pt.   Habits & Providers  Alcohol -Tobacco-Diet     Tobacco Status: never  Past History:  Past Medical History: Last updated: 02/04/2008 Anemia with low MCV No history of STDs or abnormal Paps HTN DMII Benign Paroxysmal Positional Vertigo  Past Surgical History: Last updated: 05/14/2006 BTL (2000) -, Cholecystectomy (1994) -  Family History: Last updated: 10/06/2007 2 older brothers are healthy, Father - HTN, lipids, Mother - Breast CA, DM, CHF, Htn, osteoporosis  Social History: Last updated: 08/14/2006 Lives with husband and 2 children.  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  No ETOH.  Quit smoking in 2004 after a 10 yr history.  Social History: Smoking Status:  never  Review of Systems       See HPI  Physical Exam  General:  Well-developed,well-nourished,in no acute distress; alert,appropriate and cooperative throughout examination Lungs:  Normal respiratory effort, chest expands symmetrically. Lungs are clear to auscultation, no crackles or wheezes. Heart:  Normal rate and regular rhythm. S1 and S2 normal without gallop, murmur, click, rub or other extra sounds. Genitalia:  Normal introitus for age, no external lesions, no vaginal discharge, some clots seen at os, pt  states she should be starting her cycle soon, mucosa pink and moist, no vaginal or cervical lesions, no vaginal atrophy, no friaility or hemorrhage, normal uterus size and position, no adnexal masses or tenderness   Impression & Recommendations:  Problem # 1:  SCREENING FOR MALIGNANT NEOPLASM OF THE CERVIX (ICD-V76.2) Assessment Comment Only PAP done today.  Orders: Pap Smear-[facility] (11824-02999) [facility]- Est  Level 4 (99214)  Problem # 2:  DIZZINESS (ICD-780.4) Assessment: Improved Resolved.  Her updated medication list for this problem includes:    Meclizine  Hcl 25 Mg Tabs (Meclizine  hcl) .SABRA... 1 tab by mouth two times a day  Problem # 3:  OBESITY, NOS (ICD-278.00) Assessment: Deteriorated Pt to try to eat better.  Also increased metformin  which should help with weight.  Orders: [facility]- Est  Level 4 (00785)  Problem # 4:  HYPERTENSION, BENIGN SYSTEMIC (ICD-401.1) Assessment: Deteriorated Changed to lisinopril  HCTZ combo, RTC 2 weeks for RN BP check, then 3 months to see me.  Can add norvasc  if still uncontrolled.  Her updated medication list for this problem includes:    Lisinopril -hydrochlorothiazide  20-25 Mg Tabs (Lisinopril -hydrochlorothiazide ) ..... One tab by mouth daily  Orders: [facility]- Est  Level 4 (00785)  Problem # 5:  DIABETES MELLITUS II, UNCOMPLICATED (ICD-250.00) Assessment: Improved A1c better but only taking 1000mg  total metformin .  Split glip/met into glip 10 once daily and metformin  1000 two times a day for total 2000 daily dose  metformin . Recheck A1c in 3 months.  Pt agrees to schedule ophtho appt.  RTC 3 months to re-eval.  Her updated medication list for this problem includes:    Lisinopril -hydrochlorothiazide  20-25 Mg Tabs (Lisinopril -hydrochlorothiazide ) ..... One tab by mouth daily    Glipizide  10 Mg Tabs (Glipizide ) ..... One tab by mouth daily    Adult Aspirin Ec Low Strength 81 Mg Tbec (Aspirin) ..... One tab by mouth daily for heart protection.     Metformin  Hcl 1000 Mg Tabs (Metformin  hcl) ..... One tab by mouth two times a day  Orders: A1C-[facility] (16963) [facility]- Est  Level 4 (00785)  Complete Medication List: 1)  Lisinopril -hydrochlorothiazide  20-25 Mg Tabs (Lisinopril -hydrochlorothiazide ) .... One tab by mouth daily 2)  Glipizide  10 Mg Tabs (Glipizide ) .... One tab by mouth daily 3)  Adult Aspirin Ec Low Strength 81 Mg Tbec (Aspirin) .... One tab by mouth daily for heart protection. 4)  Meclizine  Hcl 25 Mg Tabs (Meclizine  hcl) .SABRA.. 1 tab by mouth two times a day 5)  Metformin  Hcl 1000 Mg Tabs (Metformin  hcl) .... One tab by mouth two times a day  Patient Instructions: 1)  Good to see you: 2)  Make eye doctor appt for diabetic exam. 3)  Take new med dosages as rx'ed below. 4)  Come back for Nurse BP check in 2 weeks. 5)  Come back to see me in 3 months, we still haven't controlled your BP or Diabetes. 6)  -Dr. ONEIDA. Prescriptions: METFORMIN  HCL 1000 MG TABS (METFORMIN  HCL) One tab by mouth two times a day  #180 x 6   Entered and Authorized by:   Debby Petties MD   Signed by:   Debby Petties MD on 12/21/2008   Method used:   Faxed to ...       [facility] (retail)       [address] [place]  [address]       [place], [place]  [address]       Ph: [phone] or [phone]       Fax: [phone]   RxID:   [id number] GLIPIZIDE  10 MG TABS (GLIPIZIDE ) One tab by mouth daily  #90 x 6   Entered and Authorized by:   Debby Petties MD   Signed by:   Debby Petties MD on 12/21/2008   Method used:   Faxed to ...       [facility] (retail)       [address] [place]  [address]       [place], [place]  [address]       Ph: [phone] or [phone]       Fax: [phone]   RxID:   [id number] LISINOPRIL -HYDROCHLOROTHIAZIDE  20-25 MG TABS (LISINOPRIL -HYDROCHLOROTHIAZIDE ) One tab by mouth daily  #30 x 0   Entered and Authorized by:   Debby Petties MD   Signed by:   Debby Petties MD on  12/21/2008   Method used:   Faxed to ...       [facility] (retail)       [address] [place]  [address]       [place], [place]  [address]       Ph: [phone] or [phone]       Fax: [phone]   RxID:   [id number]   Last HDL:  48 (01/27/2008 10:11:00 PM) HDL Next Due:  1 yr Last  LDL:  110 (01/27/2008 10:11:00 PM) LDL Next Due:  1 yr  Laboratory Results   Blood Tests   Date/Time Received: December 21, 2008 8:42 AM  Date/Time Reported: December 21, 2008 8:53 AM   HGBA1C: 9.9%   (Normal Range: Non-Diabetic - 3-6%   Control Diabetic - 6-8%)  Comments: ...............test performed by......SABRABonnie A. Jordan, MT (ASCP)        Prevention & Chronic Care Immunizations   Influenza vaccine: Not documented    Tetanus booster: 05/16/2003: Done.   Tetanus booster due: 05/15/2013    Pneumococcal vaccine: given  (11/26/2005)   Pneumococcal vaccine due: None  Other Screening   Pap smear: Due previous entry was an error.  (08/14/2006)   Pap smear due: 08/14/2007   Smoking status: never  (12/21/2008)  Diabetes Mellitus   HgbA1C: 9.9  (12/21/2008)   Hemoglobin A1C due: 03/23/2009    Eye exam: Not documented    Foot exam: yes  (01/27/2008)   High risk foot: Not documented   Foot care education: completed  (01/27/2008)   Foot exam due: 01/26/2009    Urine microalbumin/creatinine ratio: Not documented   Urine microalbumin/cr due: 01/26/2009    Diabetes flowsheet reviewed?: Yes   Progress toward A1C goal: Improved  Lipids   Total Cholesterol: 180  (01/27/2008)   LDL: 110  (01/27/2008)   LDL Direct: Not documented   HDL: 48  (01/27/2008)   Triglycerides: 110  (01/27/2008)   Lipid panel due: 01/26/2009    SGOT (AST): 10  (08/31/2006)   SGPT (ALT): <8 U/L  (08/31/2006)   Alkaline phosphatase: 112  (08/31/2006)   Total bilirubin: 0.3  (08/31/2006)    Lipid flowsheet reviewed?: Yes   Progress toward LDL goal: At goal  Hypertension   Last Blood  Pressure: 163 / 95  (12/21/2008)   Serum creatinine: 0.8  (01/24/2008)   Serum potassium 3.8  (01/24/2008)   Basic metabolic panel due: 01/23/2009    Hypertension flowsheet reviewed?: Yes   Progress toward BP goal: Deteriorated  Self-Management Support :   Personal Goals (by the next clinic visit) :     Personal A1C goal: 7  (12/21/2008)     Personal blood pressure goal: 130/80  (12/21/2008)     Personal LDL goal: 130  (12/21/2008)    Diabetes self-management support: Not documented   Last diabetes self-management training by diabetes educator: completed    Hypertension self-management support: Not documented    Lipid self-management support: Not documented

## 2010-04-18 NOTE — Miscellaneous (Signed)
   Clinical Lists Changes  Problems: Removed problem of DYSURIA (ICD-788.1) Removed problem of SCREENING FOR MALIGNANT NEOPLASM OF THE CERVIX (ICD-V76.2) Removed problem of DIZZINESS (ICD-780.4) Removed problem of PLANTAR FASCIITIS, LEFT (ICD-728.71) Removed problem of SYNCOPE (ICD-780.2) Removed problem of METRORRHAGIA (ICD-626.6) Removed problem of ANEMIA, IRON DEFICIENCY, UNSPEC. (ICD-280.9)

## 2010-04-18 NOTE — Progress Notes (Signed)
Summary: pt info  ---- Converted from flag ---- ---- 12/09/2009 2:08 PM, Jamie Brookes MD wrote: Please see append note to this one. Please let the patient know that her WBC's are normal and her potassium is a little low by one point: needs to eat some fruits and veggies to bring it up into normal. Glucose was 293. Please be sure to take her diabetes med ------------------------------  informed pt at visit today

## 2010-04-18 NOTE — Miscellaneous (Signed)
  Clinical Lists Changes  Problems: Added new problem of HYPERLIPIDEMIA (ICD-272.4) 

## 2010-04-18 NOTE — Progress Notes (Signed)
 Summary: Rx Req   Phone Note Refill Request Call back at Home Phone [phone] Message from:  Patient  Refills Requested: Medication #1:  GLIPIZIDE -METFORMIN  HCL 5-500 MG  TABS One tab by mouth two times a day  No more refills until seen at [facility]  Medication #2:  LISINOPRIL  40 MG TABS One tab by mouth daily PT USES [facility] [address] AND PT HAS MADE APP FOR 10/6 TO SEE DR.  Initial call taken by: Madelin Daring,  December 11, 2008 10:09 AM  Follow-up for Phone Call       Follow-up by: Ginnie Mau RN,  December 11, 2008 10:18 AM     Appended Document: Rx Req     Prescriptions: GLIPIZIDE -METFORMIN  HCL 5-500 MG  TABS (GLIPIZIDE -METFORMIN  HCL) One tab by mouth two times a day  No more refills until seen at [facility]  #20 x 0   Entered and Authorized by:   Debby Petties MD   Signed by:   Debby Petties MD on 12/11/2008   Method used:   Electronically to        [facility]* (retail)       [address]       [place], [place]  [address]       Ph: [phone] or [phone]       Fax: [phone]   RxID:   [id number] LISINOPRIL  40 MG TABS (LISINOPRIL ) One tab by mouth daily  #10 x 0   Entered and Authorized by:   Debby Petties MD   Signed by:   Debby Petties MD on 12/11/2008   Method used:   Electronically to        [facility]* (retail)       [address] [place]  [address]       [place], [place]  [address]       Ph: [phone] or [phone]       Fax: [phone]   RxID:   [id number]

## 2010-04-18 NOTE — Assessment & Plan Note (Signed)
 Summary: dizziness/ls   Vital Signs:  Patient Profile:   40 Years Old Female Height:     66 inches Weight:      250 pounds BMI:     40.50 Pulse rate:   90 / minute Pulse (ortho):   88 / minute BP sitting:   162 / 94  (left arm) BP standing:   158 / 97  Pt. in pain?   yes    Location:   head    Intensity:   7  Vitals Entered By: JACK BLOODGOOD CMA, (January 27, 2008 1:30 PM)              Is Patient Diabetic? No     Serial Vital Signs/Assessments:  Time      Position  BP       Pulse  Resp  Temp     By 2:05 PM   Lying RA  166/98   85                    THEKLA SLADE CMA, 2:05 PM   Sitting   167/100  88                    THEKLA SLADE CMA, 2:05 PM   Standing  158/97   88                    THEKLA SLADE CMA,  Diabetic Foot Exam Date:  01/27/2008 Diabetes Foot Check Result:  normal Diabetes Foot Check Next Due:  1 yr Last HGBA1C:  10.4 (08/31/2006 9:59:30 AM) HGBA1C Result Date:  01/27/2008 HGBA1C Result:  10.5 HGBA1C Next Due:  3 mo Microalbumin Result Date:  01/27/2008 Microalbumin Result:  normal Microalbumin Next Due:  1 yr Foot Care Education Date:  01/27/2008 Foot Care Education:  completed Foot Care Education Due:  1 yr Self-Mgt EDU Date:  01/27/2008 Self-Mgt EDU:  completed Self-Mgt EDU Next Due:  1 yr Last Creatinine:  0.72 (08/31/2006 6:19:00 PM) Creatinine Result Date: 01/24/2008 Creatinine Result:  0.8 Creatinine Next Due: 1 yr Last Potassium:  4.2 (08/31/2006 6:19:00 PM) Potassium Result Date:  01/24/2008 Potassium Result:  3.8 Potassium Next Due:  1 yr   PCP:  DEBBY PETTIES MD  Chief Complaint:  f/up ED. ?migraine headaches. had ct scan done. c/o dizziness worse with migraine meds.  History of Present Illness: 1F with HTN, DM2, comes in with c/o HA and dizziness for a week.  Went to ED 3 days ago, had head CT and basic ISTAT labs, all negative.  Was Dx with migraines and given midrin.  Pt states this makes her feel worse.  Has not been  taking DM or HTN meds.  States HA and dizziness are ok in morning, but progresses throughout day.  Located bilateral temples, dull, non-throbbing, no aura, no weakness, no N/V, worsened when she changes head position and rolls from one side to another in bed.  Resting makes it better.  No visual changes, no CP, SOB, abd pain, numbness/tingling.  No cough, fever, chills, runny nose, sore throat, runny eyes, body aches, but had these about a week ago.    Past Medical History:    Reviewed history from 10/06/2007 and no changes required:       Anemia with low MCV       No history of STDs or abnormal Paps       HTN       DMII  Past Surgical History:  Reviewed history from 05/14/2006 and no changes required:       BTL (2000) -, Cholecystectomy (1994) -   Family History:    Reviewed history from 10/06/2007 and no changes required:       2 older brothers are healthy, Father - HTN, lipids, Mother - Breast CA, DM, CHF, Htn, osteoporosis  Social History:    Reviewed history from 08/14/2006 and no changes required:       Lives with husband and 2 children.  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  No ETOH.  Quit smoking in 2004 after a 10 yr history.   Risk Factors:  PAP Smear History:     Date of Last PAP Smear:  08/14/2006   Mammogram History:     Date of Last Mammogram:  07/03/2006    Review of Systems       See HPI   Physical Exam  General:     Well-developed,well-nourished,in no acute distress; alert,appropriate and cooperative throughout examination Head:     Normocephalic and atraumatic without obvious abnormalities.  Eyes:     No corneal or conjunctival inflammation noted. EOMI. Perrla.  Vision grossly normal. Ears:     External ear exam shows no significant lesions or deformities.  Otoscopic examination reveals clear canals, tympanic membranes are intact bilaterally without bulging, retraction, inflammation or discharge. Hearing is grossly normal bilaterally. Nose:      External nasal examination shows no deformity or inflammation. Nasal mucosa are pink and moist without lesions or exudates. Mouth:     Oral mucosa and oropharynx without lesions or exudates.  Neck:     No deformities, masses, or tenderness noted. Chest Wall:     No deformities, masses, or tenderness noted. Lungs:     Normal respiratory effort, chest expands symmetrically. Lungs are clear to auscultation, no crackles or wheezes. Heart:     Normal rate and regular rhythm. S1 and S2 normal without gallop, murmur, click, rub or other extra sounds. Abdomen:     Bowel sounds positive,abdomen soft and non-tender without masses, organomegaly or hernias noted. Neurologic:     No cranial nerve deficits noted. Station and gait are normal. Plantar reflexes are down-going bilaterally. DTRs are symmetrical throughout. Sensory, motor and coordinative functions appear intact. Additional Exam:     EKG: NSR, no ST changes, normal QT, normal axis.  Dix-Hallpike test positive for eliciting dizziness  Orthostatics negative    Impression & Recommendations:  Problem # 1:  DIZZINESS (ICD-780.4) It seems this may be due to benign paroxysmal positional vertigo.  This is typically caused by changes in position, and is due to an otolith in the semicircular canals. Positive Dix-Hallpike.  Orthostatic vitals negative, EKG negative make arrythmia or hypotension less likely.  Will try Meclizine  for 2 weeks and have pt come back to assess resolution of symptoms.    Her updated medication list for this problem includes:    Meclizine  Hcl 25 Mg Tabs (Meclizine  hcl) .SABRA... 1 tab by mouth two times a day  Orders: EKG- [facility] (EKG) [facility]- Est  Level 4 (00785)   Problem # 2:  HYPERTENSION, BENIGN SYSTEMIC (ICD-401.1) Uncontrolled, 2/2 patient medication non-compliance.  Discussed importance and risks of hypertension and taking medications.  This may be causing some of her headache.  Pt voiced understanding and will take  medications on regular basis.  Can reassess med compliance at 2 week visit.  Her updated medication list for this problem includes:    Lisinopril  20 Mg  Tabs (Lisinopril ) .SABRA... 1 tablet by mouth daily no more refills until seen at [facility]  Orders: EKG- [facility] (EKG)   Problem # 3:  DIABETES MELLITUS II, UNCOMPLICATED (ICD-250.00) Pt also non-compliant with DM meds.  HbA1c 10.5 proves non-compliance.  Discussed risks of having poorly controlled diabetes.  Pt voiced understanding and wishes to take meds to prevent diabetic end-organ complications.  Will schedule ophtho appt for diabetic eye exam.  Urine microalbumin negative so still have time to improve control before end-organ damage.  Her updated medication list for this problem includes:    Lisinopril  20 Mg Tabs (Lisinopril ) .SABRA... 1 tablet by mouth daily no more refills until seen at [facility]    Glipizide -metformin  Hcl 5-500 Mg Tabs (Glipizide -metformin  hcl) ..... One tab by mouth two times a day  no more refills until seen at [facility]    Adult Aspirin Ec Low Strength 81 Mg Tbec (Aspirin) ..... One tab by mouth daily for heart protection.  Orders: A1C-[facility] (16963)   Problem # 4:  PLANTAR FASCIITIS, LEFT (ICD-728.71) Present at last visit, resolved.  Complete Medication List: 1)  Lisinopril  20 Mg Tabs (Lisinopril ) .SABRA.. 1 tablet by mouth daily no more refills until seen at [facility] 2)  Glipizide -metformin  Hcl 5-500 Mg Tabs (Glipizide -metformin  hcl) .... One tab by mouth two times a day  no more refills until seen at [facility] 3)  Adult Aspirin Ec Low Strength 81 Mg Tbec (Aspirin) .... One tab by mouth daily for heart protection. 4)  Meclizine  Hcl 25 Mg Tabs (Meclizine  hcl) .SABRA.. 1 tab by mouth two times a day   Patient Instructions: 1)  Good to see you again.  I think you may have a form of vertigo called benign paroxysmal positional vertigo, patients tend to have the worst dizziness when they change head positions or rotate their head.  This is treatable with an  anti-vertigo medication called meclizine .  We can try this.  I think your headache is caused by this dizziness, however having a blood pressure as uncontrolled as yours will worsen headache. 2)  I want you to take your blood pressure and diabetes meds regularly, I will refill your scripts.   3)  I also want you to come back in 2 weeks to recheck your headache/dizziness.  Stop taking the medication you got from the ER. 4)  Go to the lab to have some bloodwork done. 5)  -Dr. ONEIDA.   Prescriptions: LISINOPRIL  20 MG TABS (LISINOPRIL ) 1 tablet by mouth daily No more refills until seen at [facility]  #31 x 3   Entered and Authorized by:   DEBBY PETTIES MD   Signed by:   DEBBY PETTIES MD on 01/27/2008   Method used:   Electronically to        [facility]  [id number]* (retail)       [address] [place]  [address]       [place], [place]  [address]       Ph: [phone] or [phone]       Fax: [phone]   RxID:   [id number] GLIPIZIDE -METFORMIN  HCL 5-500 MG  TABS (GLIPIZIDE -METFORMIN  HCL) One tab by mouth two times a day  No more refills until seen at [facility]  #62 x 3   Entered and Authorized by:   DEBBY PETTIES MD   Signed by:   DEBBY PETTIES MD on 01/27/2008   Method used:   Electronically to  [facility]  [id number]* (retail)       [address] [place]  [address]       [place], [place]  [address]       Ph: [phone] or [phone]       Fax: [phone]   RxID:   [id number] ADULT ASPIRIN EC LOW STRENGTH 81 MG  TBEC (ASPIRIN) One tab by mouth daily for heart protection.  #31 x 3   Entered and Authorized by:   DEBBY PETTIES MD   Signed by:   DEBBY PETTIES MD on 01/27/2008   Method used:   Electronically to        [facility]  [id number]* (retail)       [address] [place]  [address]       [place], [place]  [address]       Ph: [phone] or [phone]       Fax: [phone]    RxID:   [id number] MECLIZINE  HCL 25 MG TABS (MECLIZINE  HCL) 1 tab by mouth two times a day  #62 x 3   Entered and Authorized by:   DEBBY PETTIES MD   Signed by:   DEBBY PETTIES MD on 01/27/2008   Method used:   Electronically to        [facility]  [id number]* (retail)       [address] [place]  [address]       [place], [place]  [address]       Ph: [phone] or [phone]       Fax: [phone]   RxID:   [id number]  ] Laboratory Results   Urine Tests  Date/Time Received: January 27, 2008 2:54 PM  Date/Time Reported: January 27, 2008 3:09 PM   Microalbumin (urine): normal mg/L   Comments: ...........test performed by...........SABRAArland Morel, CMA   Blood Tests   Date/Time Received: January 27, 2008 1:34 PM  Date/Time Reported: January 27, 2008 1:43 PM   HGBA1C: 10.5%   (Normal Range: Non-Diabetic - 3-6%   Control Diabetic - 6-8%)  Comments: ...........test performed by...........SABRAArland Morel, CMA      Appended Document: dizziness/ls     Vital Signs:  Patient Profile:   40 Years Old Female Height:     66 inches BP sitting:   162 / 94                 Diabetes Management History:      There are no symptoms to suggest diabetic complications.  No changes have been made to her treatment plan since last visit.           Diabetes Management Exam:    Foot Exam (with socks and/or shoes not present):       Sensory-Pinprick/Light touch:          Left medial foot (L-4): normal          Left dorsal foot (L-5): normal          Left lateral foot (S-1): normal       Sensory-Monofilament:          Left foot: normal       Inspection:          Left foot: normal       Nails:          Left foot: normal  Complete Medication List: 1)  Lisinopril  20 Mg Tabs (Lisinopril ) .SABRA.. 1 tablet by mouth daily no more refills until seen at [facility] 2)  Glipizide -metformin  Hcl 5-500 Mg Tabs (Glipizide -metformin  hcl) ....  One tab by mouth two times a day  no more refills until seen at [facility] 3)  Adult Aspirin Ec Low Strength 81 Mg Tbec (Aspirin) .... One tab by mouth daily for heart protection. 4)  Meclizine  Hcl 25 Mg Tabs (Meclizine  hcl) .SABRA.. 1 tab by mouth two times a day  Diabetes Management Assessment/Plan:      The following lipid goals have been established for the patient: Total cholesterol goal of 200; LDL cholesterol goal of 100; HDL cholesterol goal of 40; Triglyceride goal of 200.      ]

## 2010-04-18 NOTE — Assessment & Plan Note (Signed)
 Summary: F/U MIGRAINE/DIZZINESS/[facility]   Vital Signs:  Patient Profile:   40 Years Old Female Height:     66 inches Weight:      254.8 pounds BMI:     41.27 Pulse rate:   72 / minute BP sitting:   139 / 84  (right arm)  Pt. in pain?   no  Vitals Entered By: JACK BLOODGOOD CMA, (February 04, 2008 4:12 PM)              Is Patient Diabetic? Yes      PCP:  DEBBY PETTIES MD  Chief Complaint:  f/up last OV. Virginia [name]  History of Present Illness: 89F with HTN, DM2, Vertigo, here for follow up for dizziness from last visit.    Dizziness: Pt feels much better, only taking meclizine  once a day but dizziness almost completely resolved.  Takes it at night so doesn't know if causes her drowsiness.  Still has occasional dizziness when she turns her head rapidly.  HTN:  much improved now that she is taking her lisinopril  20.  DM2: HbA1c 10.5 at last visit.  Pt is now taking her glipizide /metformin  regularly now.     Past Medical History:    Anemia with low MCV    No history of STDs or abnormal Paps    HTN    DMII    Benign Paroxysmal Positional Vertigo  Past Surgical History:    Reviewed history from 05/14/2006 and no changes required:       BTL (2000) -, Cholecystectomy (1994) -   Family History:    Reviewed history from 10/06/2007 and no changes required:       2 older brothers are healthy, Father - HTN, lipids, Mother - Breast CA, DM, CHF, Htn, osteoporosis  Social History:    Reviewed history from 08/14/2006 and no changes required:       Lives with husband and 2 children.  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  No ETOH.  Quit smoking in 2004 after a 10 yr history.    Review of Systems       See HPI   Physical Exam  General:     Well-developed,well-nourished,in no acute distress; alert,appropriate and cooperative throughout examination Lungs:     Normal respiratory effort, chest expands symmetrically. Lungs are clear to auscultation, no crackles or  wheezes. Heart:     Normal rate and regular rhythm. S1 and S2 normal without gallop, murmur, click, rub or other extra sounds.    Impression & Recommendations:  Problem # 1:  DIZZINESS (ICD-780.4) Assessment: Improved Pt only taking once a day, and feels much better, recommended take meds twice a day as directed.  Only taking meds at night so will be able to assess if they cause her excess drowsiness at next visit when she takes it in the day too.   Her updated medication list for this problem includes:    Meclizine  Hcl 25 Mg Tabs (Meclizine  hcl) .Virginia [name]... 1 tab by mouth two times a day  Orders: [facility]- Est Level  3 (00786)   Problem # 2:  HYPERTENSION, BENIGN SYSTEMIC (ICD-401.1) Assessment: Improved Improved GREATLY from last visit, however still not at goal of 130/80 so will increase lisinopril  to 40 daily.  If still elevated at next visit, will change to lisinopril /HCTZ combo.  Cr, K+ normal.  Her updated medication list for this problem includes:    Lisinopril  40 Mg Tabs (Lisinopril ) ..... One tab by mouth daily  Orders: [facility]-  Est Level  3 (99213)   Problem # 3:  DIABETES MELLITUS II, UNCOMPLICATED (ICD-250.00) Assessment: Comment Only Will reassess A1c in 2.5 months now that pt is compliant with meds.  Pt is to schedule optho appt for dilated fundoscopy and have report send to us .  Her updated medication list for this problem includes:    Lisinopril  40 Mg Tabs (Lisinopril ) ..... One tab by mouth daily    Glipizide -metformin  Hcl 5-500 Mg Tabs (Glipizide -metformin  hcl) ..... One tab by mouth two times a day  no more refills until seen at [facility]    Adult Aspirin Ec Low Strength 81 Mg Tbec (Aspirin) ..... One tab by mouth daily for heart protection.  Orders: [facility]- Est Level  3 (00786)   Problem # 4:  Screening Cervical Cancer (ICD-V76.2) Will do PAP at next visit in 2 weeks.  Complete Medication List: 1)  Lisinopril  40 Mg Tabs (Lisinopril ) .... One tab by mouth daily 2)   Glipizide -metformin  Hcl 5-500 Mg Tabs (Glipizide -metformin  hcl) .... One tab by mouth two times a day  no more refills until seen at [facility] 3)  Adult Aspirin Ec Low Strength 81 Mg Tbec (Aspirin) .... One tab by mouth daily for heart protection. 4)  Meclizine  Hcl 25 Mg Tabs (Meclizine  hcl) .Virginia [name].. 1 tab by mouth two times a day   Patient Instructions: 1)  Good to see that you are feeling better.  2)  I want you to keep taking your meclizine , but take twice a day as directed. 3)  I will also increase your lisinopril  to 40mg  daily as your BP was high still. 4)  I want to see you again in 2 weeks.  You will need a PAP smear at that time. 5)  Also I will schedule you to see the eye doctor, it is important to track any retinal damage due to your diabetes. 6)  -Dr. ONEIDA.   Prescriptions: LISINOPRIL  40 MG TABS (LISINOPRIL ) One tab by mouth daily  #31 x 3   Entered and Authorized by:   DEBBY PETTIES MD   Signed by:   DEBBY PETTIES MD on 02/04/2008   Method used:   Electronically to        [facility] [id number] (retail)       [address]       [place], [place]  [address]       Ph: [phone] or [phone]       Fax: [phone]   RxID:   [id number]  ]

## 2010-04-18 NOTE — Assessment & Plan Note (Signed)
Summary: haven't had a menses since Sept/abd area feels real tender/bmc   Vital Signs:  Patient profile:   40 year old female Height:      66 inches Weight:      231 pounds BMI:     37.42 Temp:     98.1 degrees F oral Pulse rate:   75 / minute BP sitting:   120 / 70  (left arm) Cuff size:   large  Vitals Entered By: Jimmy Footman, CMA (February 01, 2010 9:47 AM) CC: no menses x2 months.  Is Patient Diabetic? Yes Did you bring your meter with you today? No Pain Assessment Patient in pain? no      Comments tubal ligation in 2000   Primary Care Folasade Mooty:  Rodney Langton MD  CC:  no menses x2 months. .  History of Present Illness: 40 yo female with amenorrhea for 1.5 months.    Amenorrhea: No changes in medications.  Hx tubal ligation 2000.  Periods used to be regular.  Now no period and mild tenderness suprapubic.  UA and upreg neg.  No trauma.  No N/V/D/C/Fevers/Chills.  Due for several preventive labs.  Habits & Providers  Alcohol-Tobacco-Diet     Tobacco Status: quit  Current Medications (verified): 1)  Lisinopril-Hydrochlorothiazide 20-25 Mg Tabs (Lisinopril-Hydrochlorothiazide) .... One Tab By Mouth Daily 2)  Glipizide 10 Mg Tabs (Glipizide) .... One Tab By Mouth Daily 3)  Adult Aspirin Ec Low Strength 81 Mg  Tbec (Aspirin) .... One Tab By Mouth Daily For Heart Protection. 4)  Meclizine Hcl 25 Mg Tabs (Meclizine Hcl) .Marland Kitchen.. 1 Tab By Mouth Two Times A Day 5)  Amlodipine Besylate 5 Mg Tabs (Amlodipine Besylate) .... One Tab By Mouth Daily  Allergies (verified): 1)  ! * Shellfish  Review of Systems       See HPI  Physical Exam  General:  Well-developed,well-nourished,in no acute distress; alert,appropriate and cooperative throughout examination Lungs:  Normal respiratory effort, chest expands symmetrically. Lungs are clear to auscultation, no crackles or wheezes. Heart:  Normal rate and regular rhythm. S1 and S2 normal without gallop, murmur, click, rub or  other extra sounds. Abdomen:  Bowel sounds positive,abdomen soft and non-tender without masses, organomegaly or hernias noted. Genitalia:  Normal introitus for age, no external lesions, no vaginal discharge, mucosa pink and moist, no vaginall lesions.   There is what appears to be 3 uterine polyps protruding from the cervical os at 3, 5, and 6 oclock.  no vaginal atrophy, no friaility or hemorrhage, normal uterus size and position, no adnexal masses or tenderness   Impression & Recommendations:  Problem # 1:  AMENORRHEA (ICD-626.0) Assessment New Unclear etiology. Checking fertility panel to assess for perimenopausal hormonal changes. US pelvis. TSH.  Orders: Urinalysis-[facility] (00000) U Preg-[facility] (81025) [facility]- Est  Level 4 (99214) CBC-[facility] (91478) FSH-[facility] (29562-13086) LH-[facility] (57846-96295) TSH-[facility] [id number]) Testosterone-[facility] [phone]) Miscellaneous Lab Charge-[facility] [phone]) Ultrasound (Ultrasound)  Problem # 2:  UNSPECIFIED BREAST SCREENING (ICD-V76.10) Assessment: New Mammo ordered.  Orders: Mammogram (Screening) (Mammo)  Problem # 3:  PELVIC PAIN, ACUTE (ICD-789.09) Assessment: New See #1.  Her updated medication list for this problem includes:    Adult Aspirin Ec Low Strength 81 Mg Tbec (Aspirin) ..... One tab by mouth daily for heart protection.  Orders: Ultrasound (Ultrasound)  Problem # 4:  SCREENING FOR MALIGNANT NEOPLASM OF THE CERVIX (ICD-V76.2) Assessment: New PAP done. Also suspect endometrial polyps protruding from cervical os. [facility] endometrial and possible  cervical polyp biopsy.  Orders: Pap Smear-[facility] ([id number])  Problem # 5:  DIABETES MELLITUS II, UNCOMPLICATED (ICD-250.00) Assessment: Deteriorated A1c very elevated. Will need to reassess diabetes however pt only comes to [facility] for [facility] visits.   Will attempt to make appt dedicated to DM mgt.  Problem # 6:  HYPERTENSION, BENIGN SYSTEMIC  (ICD-401.1) Assessment: Improved Controlled. No changes needed.  Her updated medication list for this problem includes:    Lisinopril-hydrochlorothiazide 20-25 Mg Tabs (Lisinopril-hydrochlorothiazide) ..... One tab by mouth daily    Amlodipine Besylate 5 Mg Tabs (Amlodipine besylate) ..... One tab by mouth daily  Orders: [facility]- Est  Level 4 (99214) Comp Met-[facility] ([id number])  Complete Medication List: 1)  Lisinopril-hydrochlorothiazide 20-25 Mg Tabs (Lisinopril-hydrochlorothiazide) .... One tab by mouth daily 2)  Glipizide 10 Mg Tabs (Glipizide) .... One tab by mouth daily 3)  Adult Aspirin Ec Low Strength 81 Mg Tbec (Aspirin) .... One tab by mouth daily for heart protection. 4)  Meclizine Hcl 25 Mg Tabs (Meclizine hcl) .Marland Kitchen.. 1 tab by mouth two times a day 5)  Amlodipine Besylate 5 Mg Tabs (Amlodipine besylate) .... One tab by mouth daily  Other Orders: A1C-[facility] (29562) Direct LDL-[facility] [phone]) [facility] (96295[facility]  Patient Instructions: 1)  Checking bloodwork. 2)  Mammogram. 3)  Pelvic ultrasound. 4)  You need to make an appt at the front to see Dr. Denny Levy here to further examine your cervix. 5)  Come back to see me after you have seen Dr. Jennette Kettle. 6)  I will let you know if bloodwork is abnormal. 7)  -Dr. Karie Schwalbe.   Orders Added: 1)  Urinalysis-[facility] [00000] 2)  U Preg-[facility] [81025] 3)  [facility]- Est  Level 4 [99214] 4)  Comp Met-[facility] [28413-24401] 5)  CBC-[facility] [85027] 6)  A1C-[facility] [83036] 7)  Direct LDL-[facility] [83721-81033] 8)  FSH-[facility] [83001-23670] 9)  LH-[facility] [83002-23680] 10)  TSH-[facility] [02725-36644] 11)  Testosterone-[facility] [[id number]] 12)  Miscellaneous Lab Charge-[facility] [99999] 13)  Mammogram (Screening) [Mammo] 14)  Wet Prep- [facility] [87210] 15)  Pap Smear-[facility] [[id number]] 16)  Ultrasound [Ultrasound]    Laboratory Results   Urine Tests  Date/Time Received: February 01, 2010 10:08 AM  Date/Time Reported: February 01, 2010 10:13 AM   Routine Urinalysis   Color:  yellow Appearance: Clear Glucose: 500   (Normal Range: Negative) Bilirubin: negative   (Normal Range: Negative) Ketone: negative   (Normal Range: Negative) Spec. Gravity: 1.015   (Normal Range: 1.003-1.035) Blood: negative   (Normal Range: Negative) pH: 5.5   (Normal Range: 5.0-8.0) Protein: negative   (Normal Range: Negative) Urobilinogen: 0.2   (Normal Range: 0-1) Nitrite: negative   (Normal Range: Negative) Leukocyte Esterace: negative   (Normal Range: Negative)    Urine HCG: negative Comments: .........Marland Kitchenbiochemical negative; microscopic not indicated ...............test performed by......Marland KitchenBonnie A. [place], MLS (ASCP)cm   Blood Tests   Date/Time Received: February 01, 2010 10:52 AM  Date/Time Reported: February 01, 2010 11:35 AM   HGBA1C: 13.3%   (Normal Range: Non-Diabetic - 3-6%   Control Diabetic - 6-8%)  Comments: ...............test performed by......Marland KitchenBonnie A. [place], MLS (ASCP)cm  Date/Time Received: February 01, 2010 10:40 AM  Date/Time Reported: February 01, 2010 10:53 AM   [employer] Source: vag WBC/hpf: >20 Bacteria/hpf: 3+  Rods Clue cells/hpf: none  Negative whiff Yeast/hpf: rare Trichomonas/hpf: none Comments: ...............test performed by......Marland KitchenBonnie A. [place], MLS (ASCP)cm            CMP ordered   CMP ordered

## 2010-04-18 NOTE — Assessment & Plan Note (Signed)
Summary: f/u uti-still in pain/Alba/T   Vital Signs:  Patient profile:   40 year old female Height:      66 inches Weight:      235 pounds BMI:     38.07 Temp:     98.1 degrees F oral Pulse rate:   75 / minute BP sitting:   145 / 92  (left arm) Cuff size:   large  Vitals Entered By: Jimmy Footman, CMA (December 10, 2009 10:19 AM) CC: back pain Is Patient Diabetic? Yes Did you bring your meter with you today? No Pain Assessment Patient in pain? yes     Location: abdomen Intensity: 10 Type: sharp Comments on keflex----not helping   Primary Care Provider:  Rodney Langton MD  CC:  back pain.  History of Present Illness: pt seen friday for back and stomach pain.  on her period at that time and usually has painful periods.  found to have UTI at that time, but WBC not elevated.  UTI due to ecoli sensitive to keflex.  pt taking keflex since friday.  over weekend pt with temp to 100.1 and some sweats, also malaise and myalgias.  pt agrees taht these are likely flu symptoms and she is beginning to feel a little better.  taking motrin and tylenol for these symptoms.  drinking fluids.    Habits & Providers  Alcohol-Tobacco-Diet     Tobacco Status: quit  Current Medications (verified): 1)  Lisinopril-Hydrochlorothiazide 20-25 Mg Tabs (Lisinopril-Hydrochlorothiazide) .... One Tab By Mouth Daily 2)  Glipizide 10 Mg Tabs (Glipizide) .... One Tab By Mouth Daily 3)  Adult Aspirin Ec Low Strength 81 Mg  Tbec (Aspirin) .... One Tab By Mouth Daily For Heart Protection. 4)  Meclizine Hcl 25 Mg Tabs (Meclizine Hcl) .Marland Kitchen.. 1 Tab By Mouth Two Times A Day 5)  Amlodipine Besylate 5 Mg Tabs (Amlodipine Besylate) .... One Tab By Mouth Daily 6)  Keflex 500 Mg Caps (Cephalexin) .... Take 1 Pill Twice Daily For 10 Days.  Allergies (verified): 1)  ! * Shellfish  Social History: Smoking Status:  quit  Review of Systems       The patient complains of fever.  The patient denies anorexia, weight  loss, chest pain, and syncope.    Physical Exam  General:  VS reviewedwell-developed, well-nourished, and well-hydrated.   Head:  normocephalic and atraumatic.   Eyes:  no injection Ears:  R ear normal and L ear normal.   Nose:  no external deformity, no external erythema, and no nasal discharge.   Lungs:  Normal respiratory effort, chest expands symmetrically. Lungs are clear to auscultation, no crackles or wheezes. Heart:  Normal rate and regular rhythm. S1 and S2 normal without gallop, murmur, click, rub or other extra sounds. Abdomen:  tender to palpation diffusely Msk:  full ROM in back.  tender over paraspinal muscles.   Impression & Recommendations:  Problem # 1:  FLU SYNDROME (ICD-487.1) Assessment Unchanged pt was felt to have component of flu at last visit. now that period and UTI better and pt still having increased temps, sweats, myalgias, and malaise, likely to have flu as cause.  encouraged rest, fluids. Orders: [facility]- Est Level  3 (16109)  Problem # 2:  DYSURIA (ICD-788.1) Assessment: Improved keflex will cover her e coli.  pt agrees to keep taking it.  encouraged fluids Her updated medication list for this problem includes:    Keflex 500 Mg Caps (Cephalexin) .Marland Kitchen... Take 1 pill twice daily for 10 days.  Orders: [facility]- Est Level  3 (16109)  Complete Medication List: 1)  Lisinopril-hydrochlorothiazide 20-25 Mg Tabs (Lisinopril-hydrochlorothiazide) .... One tab by mouth daily 2)  Glipizide 10 Mg Tabs (Glipizide) .... One tab by mouth daily 3)  Adult Aspirin Ec Low Strength 81 Mg Tbec (Aspirin) .... One tab by mouth daily for heart protection. 4)  Meclizine Hcl 25 Mg Tabs (Meclizine hcl) .Marland Kitchen.. 1 tab by mouth two times a day 5)  Amlodipine Besylate 5 Mg Tabs (Amlodipine besylate) .... One tab by mouth daily 6)  Keflex 500 Mg Caps (Cephalexin) .... Take 1 pill twice daily for 10 days.  Patient Instructions: 1)  Make sure you are staying hydrated, drink plenty of fluids and  get lots of rest 2)  Take 650 - 1000 mg of tylenol every 4-6 hours as needed for relief of pain or comfort of fever. Avoid taking more than 4000 mg in a 24 hour period( can cause liver damage in higher doses).  3)  Come back if you get worse or are not any better by Friday

## 2010-05-01 ENCOUNTER — Inpatient Hospital Stay (INDEPENDENT_AMBULATORY_CARE_PROVIDER_SITE_OTHER)
Admission: RE | Admit: 2010-05-01 | Discharge: 2010-05-01 | Disposition: A | Discharge status: Home / Self Care | Payer: 59 | Source: Ambulatory Visit | Attending: Family Medicine | Admitting: Family Medicine

## 2010-05-01 ENCOUNTER — Emergency Department (HOSPITAL_COMMUNITY)
Admission: EM | Admit: 2010-05-01 | Discharge: 2010-05-02 | Disposition: A | Discharge status: Home / Self Care | Payer: 59 | Attending: Emergency Medicine | Admitting: Emergency Medicine

## 2010-05-01 DIAGNOSIS — N390 Urinary tract infection, site not specified: Secondary | ICD-10-CM

## 2010-05-01 DIAGNOSIS — I10 Essential (primary) hypertension: Secondary | ICD-10-CM | POA: Insufficient documentation

## 2010-05-01 DIAGNOSIS — N898 Other specified noninflammatory disorders of vagina: Secondary | ICD-10-CM | POA: Insufficient documentation

## 2010-05-01 DIAGNOSIS — R109 Unspecified abdominal pain: Secondary | ICD-10-CM | POA: Insufficient documentation

## 2010-05-01 DIAGNOSIS — R42 Dizziness and giddiness: Secondary | ICD-10-CM | POA: Insufficient documentation

## 2010-05-01 DIAGNOSIS — R7989 Other specified abnormal findings of blood chemistry: Secondary | ICD-10-CM

## 2010-05-01 DIAGNOSIS — Z79899 Other long term (current) drug therapy: Secondary | ICD-10-CM | POA: Insufficient documentation

## 2010-05-01 DIAGNOSIS — R5383 Other fatigue: Secondary | ICD-10-CM | POA: Insufficient documentation

## 2010-05-01 DIAGNOSIS — R5381 Other malaise: Secondary | ICD-10-CM | POA: Insufficient documentation

## 2010-05-01 DIAGNOSIS — R112 Nausea with vomiting, unspecified: Secondary | ICD-10-CM | POA: Insufficient documentation

## 2010-05-01 DIAGNOSIS — R358 Other polyuria: Secondary | ICD-10-CM | POA: Insufficient documentation

## 2010-05-01 DIAGNOSIS — E119 Type 2 diabetes mellitus without complications: Secondary | ICD-10-CM | POA: Insufficient documentation

## 2010-05-01 DIAGNOSIS — R3589 Other polyuria: Secondary | ICD-10-CM | POA: Insufficient documentation

## 2010-05-01 LAB — COMPREHENSIVE METABOLIC PANEL
AST: 12 U/L (ref 0–37)
Albumin: 3.4 g/dL — ABNORMAL LOW (ref 3.5–5.2)
BUN: 13 mg/dL (ref 6–23)
CO2: 29 mEq/L (ref 19–32)
Creatinine, Ser: 1.26 mg/dL — ABNORMAL HIGH (ref 0.4–1.2)
GFR calc Af Amer: 57 mL/min — ABNORMAL LOW (ref >60)
GFR calc non Af Amer: 47 mL/min — ABNORMAL LOW (ref >60)
Glucose, Bld: 482 mg/dL — ABNORMAL HIGH (ref 70–99)
Potassium: 3.7 mEq/L (ref 3.5–5.1)
Total Bilirubin: 0.3 mg/dL (ref 0.3–1.2)

## 2010-05-01 LAB — DIFFERENTIAL
Basophils Absolute: 0 10*3/uL (ref 0.0–0.1)
Basophils Relative: 0 % (ref 0–1)
Eosinophils Absolute: 0 10*3/uL (ref 0.0–0.7)
Eosinophils Relative: 0 % (ref 0–5)
Lymphocytes Relative: 22 % (ref 12–46)
Lymphs Abs: 2.8 10*3/uL (ref 0.7–4.0)

## 2010-05-01 LAB — URINALYSIS, ROUTINE W REFLEX MICROSCOPIC
Hgb urine dipstick: NEGATIVE
Nitrite: POSITIVE — AB
Protein, ur: 30 mg/dL — AB
Specific Gravity, Urine: 1.026 (ref 1.005–1.030)

## 2010-05-01 LAB — POCT PREGNANCY, URINE: Preg Test, Ur: NEGATIVE

## 2010-05-01 LAB — URINE MICROSCOPIC-ADD ON

## 2010-05-01 LAB — CBC
MCV: 74.4 fL — ABNORMAL LOW (ref 78.0–100.0)
Platelets: 350 10*3/uL (ref 150–400)

## 2010-05-02 LAB — WET PREP, GENITAL
Clue Cells Wet Prep HPF POC: NONE SEEN
Trich, Wet Prep: NONE SEEN
Yeast Wet Prep HPF POC: NONE SEEN

## 2010-05-02 LAB — GLUCOSE, CAPILLARY
Glucose-Capillary: 215 mg/dL — ABNORMAL HIGH (ref 70–99)
Glucose-Capillary: 369 mg/dL — ABNORMAL HIGH (ref 70–99)
Glucose-Capillary: 377 mg/dL — ABNORMAL HIGH (ref 70–99)
Glucose-Capillary: 417 mg/dL — ABNORMAL HIGH (ref 70–99)

## 2010-05-03 ENCOUNTER — Ambulatory Visit (INDEPENDENT_AMBULATORY_CARE_PROVIDER_SITE_OTHER): Payer: 59 | Admitting: Family Medicine

## 2010-05-03 ENCOUNTER — Encounter: Payer: Self-pay | Admitting: Family Medicine

## 2010-05-03 ENCOUNTER — Telehealth: Payer: Self-pay | Admitting: Sports Medicine

## 2010-05-03 VITALS — BP 124/81 | HR 89 | Wt 229.0 lb

## 2010-05-03 DIAGNOSIS — N390 Urinary tract infection, site not specified: Secondary | ICD-10-CM | POA: Insufficient documentation

## 2010-05-03 DIAGNOSIS — E119 Type 2 diabetes mellitus without complications: Secondary | ICD-10-CM

## 2010-05-03 LAB — URINE CULTURE: Colony Count: >=100000

## 2010-05-03 MED ORDER — CEPHALEXIN 500 MG PO CAPS: 500.0000 mg | ORAL_CAPSULE | Freq: Two times a day (BID) | ORAL | Status: DC

## 2010-05-03 NOTE — Patient Instructions (Signed)
Thanks for seeing me today.  See Dr. Karie Schwalbe in 2 week or sooner if you get worse.  Take your glipizide twice a day.  Check your BS in the morning and if you feel funny. Let us know if it gets less than 100. Bring you meter to your visit.  Take the keflex (cephelexin) twice a day for 10 days.  You can treat yeast infections with OTC yeast meds.  Let us know about high fevers.

## 2010-05-03 NOTE — Telephone Encounter (Signed)
Pt states she went to ED 2 days ago for elevated BS(over 500) and UTI - got insulin drip and abx - her BS is still high at 253 fasting - not sure if she needs to be seen

## 2010-05-03 NOTE — Assessment & Plan Note (Signed)
Worsened due to UTI.  Treating infection as noted.  Plan to increase glipizide to twice a day.  Warned about hypoglycemia and the importance of frequent checks.  F/u with Dr. Karie Schwalbe in 2 weeks. Will bring meter.

## 2010-05-03 NOTE — Assessment & Plan Note (Signed)
Think pt has a partially treated UTI. Bacteria was resistant to Levofloxacin. She likely received ceftriaxone in the ED.  No signs or symptoms at this time for pyelonephritis.  Plan: 10 day course of keflex.  F/u in 2 weeks with Dr. Karie Schwalbe.

## 2010-05-03 NOTE — Progress Notes (Signed)
UTI: Pt seen for UTI at [facility] urgent care on 2/11. She was given Levofloxacin. However she worsened and was sent to the ED from work when she felt ill and her blood sugar was found to be in the 400s.  In the ED her BS was in the 500s. She was treated with fluids, insulin and IV ABX.  UCX today shows >100K CFUs E.Coli Resistant to Levofloxacin and sensitive to 1st and 3rd gen cephlasporins.   Today she feels well 1 day following D/C from ED. Her BS was 240 this AM. She denies any fever or chills. No flank pain. No polyuria or polydipsia. She takes her glipizide once a day currently.  No hypoglycemic events.   ROS as above.  EXAM: VS noted.  Gen: Well NAD HEENT: MMM Lungs: CTABL Heart: RRR no MRG ABD; Soft, ND, No flank tenderness. Mild suprapubic pain with no mass or rebound.  Ext: Non edematous BL LE Lymph: No cervical or axillary LAD

## 2010-05-03 NOTE — Telephone Encounter (Signed)
CBG this am was 247.  Pt c/o dizziness.  She has not been seen for a DM visit since Sept.  Told her she needed to be worked in.  I will route this note to the schedulers to see if we could get her in this afternoon or Monday.   In the meantime told her to drink plenty of fluids and avoid sugury foods.

## 2010-05-17 ENCOUNTER — Ambulatory Visit: Payer: 59 | Admitting: Sports Medicine

## 2010-05-30 LAB — GLUCOSE, CAPILLARY: Glucose-Capillary: 309 mg/dL — ABNORMAL HIGH (ref 70–99)

## 2010-08-02 NOTE — Op Note (Signed)
[facility]  Patient:    Virginia [name]                          MRN: [id number] Proc. Date: [birth year] Adm. Date:  [id number] Attending:  Leonard Schwartz                           Operative Report  PREOPERATIVE DIAGNOSIS:       Postpartum day 0.  Desires sterilization. Obesity (weight 250 pounds).  POSTOPERATIVE DIAGNOSIS:      Postpartum day 0.  Desires sterilization. Obesity (weight 250 pounds).  OPERATION:                    Postpartum bilateral tubal ligation.  SURGEON:                      Janine Limbo, M.D.  ASSISTANT:  ANESTHESIA:                   Epidural.  ESTIMATED BLOOD LOSS:  INDICATIONS:                  [name] is a 40 year old who had a vaginal delivery earlier today of a healthy female infant.  She desires permanent sterilization.  She understands the indications for her surgical procedure and he accepts the risks of, but not limited to, anesthetic complications, bleeding, infections, possible damage to the surrounding organs, and possible tubal failure (10 to 12 per 1000).  FINDINGS:                     The fallopian tubes were normal bilaterally.  DESCRIPTION OF PROCEDURE:     The patient was taken to the operating room where an epidural anesthetic was given.  The patients abdomen was prepped with multiple layers of Betadine and sterilely draped.  The subumbilical area was injected with 7 cc of 0.25% Marcaine without epinephrine.  A subumbilical incision was made and  carried sharply through the subcutaneous tissue, the fascia, and the anterior peritoneum.  The left fallopian tube was identified and followed to its fimbriated end.  A knuckle of tube was made on the left using a tied suture ligature and then a free tie of 0 plain catgut.  The knuckle of tube thus made was surgically excised.  Hemostasis was adequate.  An identical procedure was carried out on the opposite side.  Again hemostasis was  adequate.  The cut ends of the fallopian tubes were cauterized.  All instruments were removed.  The fascia was closed using a running suture of 2-0 Vicryl.  The subcutaneous tissue was closed using a running suture of 4-0 Vicryl.  Sponge, needle, and instrument counts were correct x 2 occasions.  The estimated blood loss was 10 cc.  The patient tolerated her procedure well.  She was awakened from her anesthetic and taken to the recovery  room in stable condition. DD:  02/20/99 TD:  02/21/99 Job: [id number]

## 2010-08-02 NOTE — H&P (Signed)
[facility]  Patient:    Virginia [name]                          MRN: [id number] Adm. Date:  [id number] Attending:  Leonard [name] Dictator:   Virginia [name], C.N.M.                         History and Physical  HISTORY OF PRESENT ILLNESS:   Ms. Virginia [name] is a 40 year old gravida 3, para 1-0-1-1, at 37-4/7 weeks who presents with uterine contractions q.2-47min. for the last several hours.  She denies any leaking or bleeding and reports positive fetal movement.  Pregnancy has been remarkable for:  #1 - History of rapid labor; #2 - history of gestational diabetes, diet controlled with previous pregnancy, now on ADA diet,  this pregnancy; #3 - history of preterm labor; #4 - family history of breast cancer; #5 - obesity; #6 - desires bilateral tubal sterilization.  PRENATAL LABORATORY DATA:     Blood type is B-positive.  Rh-antibody negative.  VDRL nonreactive.  Rubella titer positive.  Hepatitis B surface antigen negative. Pap was normal.  Glucose challenge was elevated.  Three-hour GTT was within normal limits; the two-hour value was one point above the normal.  AFP was normal. Group B strep culture was negative at 36 weeks.  Hemoglobin upon entry into practice as 11.8; it was 11.8 at 25 weeks.  EDC of March 08, 1999 was established by last menstrual period and was in agreement with ultrasound at approximately 18 weeks.  HISTORY OF PRESENT PREGNANCY:  Patient entered care at approximately 12 weeks. She had begun a gestational diabetes diet prior to coming for her first visit.  She did have some sporadic lower abdominal pain and nausea.  She had an elevated one-hour GTT and then had the three-hour GTT that had the two-hour value of 166.  She then began to monitor her blood sugars secondary to her previous history of gestational diabetes; they were all within normal limits.  Patient also had elected permanent sterilization; she  discussed this with Virginia [name], M.D. at 29 weeks. The rest of the patients pregnancy was uncomplicated.  OBSTETRICAL HISTORY:          In 1997, she had a 14-week spontaneous miscarriage. In 1998, she had a vaginal birth of a female infant, weight 8 pounds 2 ounces, t 39-weeks gestation.  She was in labor approximately three hours.  She had no anesthesia.  She did have gestational diabetes during that pregnancy.  She also had anemia.  She had a history of hypertension during that pregnancy.  She also had  cervical shortening at 33 weeks.  MEDICAL HISTORY:              She was on Alesse in 1998, until November of 1999, then she was a condom user.  She occasionally has yeast infections.  Patient was anemic with last pregnancy.  Patient had gestational diabetes during her previous pregnancy.  She has occasional UTIs.  She had a history of kidney stones in 1996 or 1997.  Patient was a smoker for three months but she stopped smoking prior to pregnancy.  She had a laparoscopic cholecystectomy in June of 1999.  She had kidney stone treatment as an outpatient and was only hospitalized for childbirth otherwise.  ALLERGIES:  She has no known medication allergies.  She is allergic to [facility] and SHRIMP.  FAMILY HISTORY:               Her mother and paternal grandmother have congestive heart failure.  Her mother and father are hypertensive, on medication.  Her mother is an insulin-dependent diabetic; her maternal grandmother is also an insulin-dependent diabetic; both of these are adult onset.  Maternal grandfather had lupus.  Patients mother had premenopausal breast cancer; her maternal grandmother also had premenopausal breast cancer.  GENETIC HISTORY:              Remarkable for the father of the babys first cousin having cerebral palsy.  Patients brothers x 2 and her father have six fingers.  The father of the babys first cousin has six fingers.   Father of the babys brother had questionable hemophilia.  Father of the baby has sickle cell trait nd his mother also has sickle cell trait.  SOCIAL HISTORY:               Patient is married to the father of the baby; he s involved and supportive.  His name is Virginia [name].  Patient is African-American and of the [place] faith.  She has been followed by the physicians service at  [facility].  She denies any alcohol, drug or tobacco use during this pregnancy.  She is high-school-educated and is employed in Journalist, newspaper.  Her husband is also high-school-educated and is employed as a Naval architect.  PHYSICAL EXAMINATION:  VITAL SIGNS:                  Stable.  Patient is afebrile.  HEENT:                        Within normal limits.  LUNGS:                        Bilateral breath sounds are clear.  HEART:                        Regular rate and rhythm without murmur.  BREASTS:                      Soft and nontender.  ABDOMEN:                      Fundal height is approximately 39 cm.  Estimated fetal weight is 7 pounds.  Uterine contractions are every two to three minutes,  moderate quality.  PELVIC:                       Cervical exam 6 to 7 cm, 80%, vertex at a -1 station by bag of water by R.N. exam.  Fetal heart rate is reassuring and reactive.  EXTREMITIES:                  Deep tendon reflexes are 2 to 3+ without clonus.  There is a trace edema noted.  IMPRESSION:                   1. Intrauterine pregnancy at 37-4/7 weeks.                               2. Active labor.  3. History of rapid labor.                               4. History of gestational diabetes.                               5. Plan bilateral tubal sterilization.  PLAN:                         1. Admit to birthing suite per consult with                                  Virginia [name], M.D. as attending physician.                               2. Routine  physician orders.                               3. Anticipate normal spontaneous vaginal birth.                               4. Anticipate postpartum tubal sterilization. DD:  02/20/99 TD:  02/20/99 Job: [id number]

## 2010-08-13 ENCOUNTER — Other Ambulatory Visit: Payer: Self-pay | Admitting: Family Medicine

## 2010-08-13 NOTE — Telephone Encounter (Signed)
Refill request

## 2010-12-17 LAB — POCT I-STAT, CHEM 8
Calcium, Ion: 1.19
Glucose, Bld: 150 — ABNORMAL HIGH
HCT: 37
Hemoglobin: 12.6

## 2010-12-17 LAB — GLUCOSE, CAPILLARY: Glucose-Capillary: 191 — ABNORMAL HIGH

## 2011-08-04 ENCOUNTER — Other Ambulatory Visit: Payer: Self-pay | Admitting: Sports Medicine

## 2011-09-14 ENCOUNTER — Ambulatory Visit (INDEPENDENT_AMBULATORY_CARE_PROVIDER_SITE_OTHER): Payer: 59 | Admitting: Internal Medicine

## 2011-09-14 VITALS — BP 128/85 | HR 86 | Temp 97.9°F | Resp 18 | Ht 66.0 in | Wt 238.6 lb

## 2011-09-14 DIAGNOSIS — N760 Acute vaginitis: Secondary | ICD-10-CM

## 2011-09-14 DIAGNOSIS — N762 Acute vulvitis: Secondary | ICD-10-CM

## 2011-09-14 MED ORDER — DOXYCYCLINE HYCLATE 100 MG PO TABS: 100.0000 mg | ORAL_TABLET | Freq: Two times a day (BID) | ORAL | Status: AC

## 2011-09-14 NOTE — Progress Notes (Signed)
  Subjective:    Patient ID: Virginia [name], female    DOB: 02/21/1971, 41 y.o.   MRN: [id number]  HPIPainful swelling on the left labia founds one week/hurts to sit and walk    Review of Systems Patient Active Problem List  Diagnosis  . DIABETES MELLITUS II, UNCOMPLICATED  . HYPERLIPIDEMIA  . OBESITY, NOS  . HYPERTENSION, BENIGN SYSTEMIC  . UNSPECIFIED VAGINITIS AND VULVOVAGINITIS  . CERVICAL POLYP  . AMENORRHEA  . UTI (lower urinary tract infection)       Objective:   Physical Exam Left labia swollen with 3 cm abscess  Procedure PA Jeffery       Assessment & Plan:  Problem #1 cellulitis labia Problem #2 Other medical problems increase her risk  Meds ordered this encounter  Medications  . doxycycline (VIBRA-TABS) 100 MG tablet    Sig: Take 1 tablet (100 mg total) by mouth 2 (two) times daily.    Dispense:  20 tablet    Refill:  0   Cultures sent

## 2011-09-14 NOTE — Patient Instructions (Signed)
Apply warm compresses for 15-20 minutes 2-3 times daily. Return for re-evaluation with Dr. Merla Riches on Tuesday morning.

## 2011-09-14 NOTE — Progress Notes (Signed)
Large abscess of the left labia majorum.  VCO.  Local anesthesia with 4 cc 2% lidocaine plain.  Betadine prep.  Incision with 11 blade.  Copious purulence expressed.  Irrigated with 1 cc 2% lidocaine plain.  Packed with 1/4 inch plain packing.  Small bandage applied.

## 2011-11-04 ENCOUNTER — Other Ambulatory Visit: Payer: Self-pay | Admitting: Sports Medicine

## 2011-12-05 ENCOUNTER — Ambulatory Visit (INDEPENDENT_AMBULATORY_CARE_PROVIDER_SITE_OTHER): Payer: 59 | Admitting: Family Medicine

## 2011-12-05 ENCOUNTER — Encounter: Payer: Self-pay | Admitting: Family Medicine

## 2011-12-05 ENCOUNTER — Telehealth: Payer: Self-pay | Admitting: Family Medicine

## 2011-12-05 VITALS — BP 160/90 | HR 106 | Temp 98.2°F | Ht 66.0 in | Wt 238.2 lb

## 2011-12-05 DIAGNOSIS — E119 Type 2 diabetes mellitus without complications: Secondary | ICD-10-CM

## 2011-12-05 DIAGNOSIS — E785 Hyperlipidemia, unspecified: Secondary | ICD-10-CM

## 2011-12-05 DIAGNOSIS — I10 Essential (primary) hypertension: Secondary | ICD-10-CM

## 2011-12-05 LAB — CBC
HCT: 37.2 % (ref 36.0–46.0)
Hemoglobin: 11.8 g/dL — ABNORMAL LOW (ref 12.0–15.0)
RBC: 4.87 MIL/uL (ref 3.87–5.11)
WBC: 8 10*3/uL (ref 4.0–10.5)

## 2011-12-05 LAB — BASIC METABOLIC PANEL
BUN: 8 mg/dL (ref 6–23)
CO2: 24 mEq/L (ref 19–32)
Chloride: 100 mEq/L (ref 96–112)
Glucose, Bld: 393 mg/dL — ABNORMAL HIGH (ref 70–99)
Potassium: 3.9 mEq/L (ref 3.5–5.3)

## 2011-12-05 LAB — POCT GLYCOSYLATED HEMOGLOBIN (HGB A1C): Hemoglobin A1C: >14

## 2011-12-05 MED ORDER — GLUCOSE BLOOD VI STRP: ORAL_STRIP | Status: DC

## 2011-12-05 MED ORDER — GLIPIZIDE 5 MG PO TABS: 5.0000 mg | ORAL_TABLET | Freq: Three times a day (TID) | ORAL | Status: DC

## 2011-12-05 MED ORDER — LISINOPRIL-HYDROCHLOROTHIAZIDE 20-25 MG PO TABS: 1.0000 | ORAL_TABLET | Freq: Every day | ORAL | Status: DC

## 2011-12-05 MED ORDER — ONETOUCH ULTRASOFT LANCETS MISC: Status: DC

## 2011-12-05 MED ORDER — METFORMIN HCL ER 500 MG PO TB24: 500.0000 mg | ORAL_TABLET | Freq: Every day | ORAL | Status: DC

## 2011-12-05 NOTE — Assessment & Plan Note (Signed)
Pt would very much like to avoid insulin.  Informed her that I don't expect that to be possible but will give her a chance.  Pt will see DM education, restart glipizide TID and start low-dose metformin XR.  RTC in 2 weeks to see how bg are doing.

## 2011-12-05 NOTE — Assessment & Plan Note (Signed)
Currently asymptomatic.  Will restart combo pill, rtc 2 weeks.

## 2011-12-05 NOTE — Patient Instructions (Signed)
It was good to see you today! I have sent in new medications for your blood pressure and your diabetes. The diabetes educator will be getting in touch with you about an appointment. You need to start checking your blood sugar at least every morning. We will get some blood work to check on your kidneys and your cholesterol. Come back to see me in 2 weeks.

## 2011-12-05 NOTE — Assessment & Plan Note (Signed)
Pt unaware of diagnosis.  Will confirm today with direct ldl.  Pt high risk for this condition even if she doesn't have it.

## 2011-12-05 NOTE — Addendum Note (Signed)
Addended by: Deno Etienne on: 12/05/2011 11:48 AM   Modules accepted: Orders

## 2011-12-05 NOTE — Telephone Encounter (Signed)
Spoke with patient and she stated that a meter needed to be called in. I called in the [facility] to [facility] and then informed patient that I had

## 2011-12-05 NOTE — Telephone Encounter (Signed)
Pt requested an rx for her testing supplies, everything was called in except for the meter, pt needs the one touch meter called into [facility]/

## 2011-12-05 NOTE — Progress Notes (Signed)
Patient ID: Virginia [name], female   DOB: 19-May-1970, 41 y.o.   MRN: [id number] Subjective: The patient is a 41 y.o. year old female who presents today for f/u.  1. DM: Out of meds for 2 months.  Some problems with blurred vision.  No CP/SOB.  Foot pain/numbness.  Does not check blood sugars.  2. HTN: Out of meds for 2 months.  Some LE swelling, but not much.  Otherwise asymptomatic.  3. HLD: In patient's problem list.  Patient unaware.  Admits to poor diet.  Not on any medications.  Patient's past medical, social, and family history were reviewed and updated as appropriate. History  Substance Use Topics  . Smoking status: Former Smoker    Quit date: 05/03/1998  . Smokeless tobacco: Not on file  . Alcohol Use: Not on file   Objective:  Filed Vitals:   12/05/11 0858  BP: 160/90  Pulse: 106  Temp: 98.2 F (36.8 C)   Gen: NAD, morbidly obese CV: RRR, not tachycardic on my exam, no murmurs Resp: CTABL Ext: 2+ pulses, trace LE edema.  DM foot exam performed.  Sensation preserved, no ulcers/wounds  Assessment/Plan:  Please also see individual problems in problem list for problem-specific plans.

## 2011-12-08 ENCOUNTER — Encounter: Payer: 59 | Attending: Family Medicine | Admitting: *Deleted

## 2011-12-08 ENCOUNTER — Encounter: Payer: Self-pay | Admitting: *Deleted

## 2011-12-08 VITALS — Ht 66.0 in | Wt 238.9 lb

## 2011-12-08 DIAGNOSIS — Z713 Dietary counseling and surveillance: Secondary | ICD-10-CM | POA: Insufficient documentation

## 2011-12-08 DIAGNOSIS — E119 Type 2 diabetes mellitus without complications: Secondary | ICD-10-CM | POA: Insufficient documentation

## 2011-12-08 NOTE — Patient Instructions (Addendum)
Goals:  Follow Diabetes Meal Plan as instructed  Eat 3 meals and 2 snacks, every 3-5 hrs  Limit carbohydrate intake to 45 grams carbohydrate/meal  Limit carbohydrate intake to 15 grams carbohydrate/snack  Add lean protein foods to meals/snacks  Monitor glucose levels as instructed by your doctor- call doctor for glucose target  Aim for 20 mins of physical activity 5 days/week  Bring food record and glucose log to your next nutrition visit

## 2011-12-08 NOTE — Progress Notes (Signed)
  Medical Nutrition Therapy:  Appt start time: 0830 end time:  0930.   Assessment:  Primary concerns today: daibetes.   MEDICATIONS: see list   DIETARY INTAKE:  Usual eating pattern includes 3 meals and 3 snacks per day.  Everyday foods include fast food, some fruits, sweets.  Avoided foods include none.    24-hr recall:  B ( AM): egg and cheese biscuit from fast food with diet soda Snk ( AM): none( asleep)  L ( PM) 3pm: [place] or ham sandwich, chips, fruit Snk ( PM): fruit or fruit cups or graham crackers with peanut butter or ritz with peanut butter D ( PM): fast food or steak with gravy, brown rice, g beans Snk ( PM): sandwich ([place], ham and cheese) and fruit cup and nutter butter bar Beverages: water, diet soda  Usual physical activity: used to walk 3 days/week, knees hurt now, but trying to get back in with new machine  Estimated energy needs: 1800 calories 200 g carbohydrates 135 g protein 50 g fat  Progress Towards Goal(s):  In progress.   Nutritional Diagnosis:  NB-1.1 Food and nutrition-related knowledge deficit As related to proper balance of fats, carbs, and protein.  As evidenced by BMI of 38.6 and HbA1C of >14%.    Intervention:  Nutrition counseling provided.  Virginia [name] is here for diabetes education.  She had GDM during one of her pregnancies and received some education, but admits to not remembering much at all.  Reviewed phsyiology of diabetes and role of obesity on insulin resistance.  Encouraged moderate weight loss to improve insulin sensitivity.  Discussed what foods contain carbohydrates and affect blood glucose levels.  Taught Virginia [name] how to count carbohydrates and how to read food labels.  Encouraged portion reduction with meats and fats and encouraged more non-starchy vegetables.  Virginia [name] currently eats out a lot.  Suggested cutting back and eating out only 1 time a day instead of 2.  Discussed low-carb snacks and importance of increasing physical activity.  Discussed  HbA1C test and risk of complications from elevated glucose.  Referred to MD for glucose target ranges  For self-monitoring   Handouts given during visit include: Living Well with Diabetes Carb Counting and Food Label handouts Meal Plan Card  Monitoring/Evaluation:  Dietary intake, exercise, BGM, and body weight in 1 month(s).

## 2011-12-22 ENCOUNTER — Ambulatory Visit: Payer: 59 | Admitting: Family Medicine

## 2011-12-29 ENCOUNTER — Encounter: Payer: Self-pay | Admitting: Family Medicine

## 2011-12-29 ENCOUNTER — Ambulatory Visit (INDEPENDENT_AMBULATORY_CARE_PROVIDER_SITE_OTHER): Payer: 59 | Admitting: Family Medicine

## 2011-12-29 VITALS — BP 156/89 | HR 92 | Temp 97.8°F | Ht 66.0 in | Wt 240.0 lb

## 2011-12-29 DIAGNOSIS — I10 Essential (primary) hypertension: Secondary | ICD-10-CM

## 2011-12-29 DIAGNOSIS — E119 Type 2 diabetes mellitus without complications: Secondary | ICD-10-CM

## 2011-12-29 NOTE — Patient Instructions (Signed)
It was good to see you today! I would like you to start working exercise into your life.  If you can loose a few pounds I hope it will significantly help with your blood sugar. Check your blood pressure several times at the drug store before you come back to see me and make certain to take your blood pressure medication prior to your next appointment. Plan on coming back to see me in 4-5 weeks.

## 2011-12-29 NOTE — Progress Notes (Signed)
Patient ID: Virginia [name], female   DOB: 1971-03-10, 41 y.o.   MRN: [id number] Subjective: The patient is a 41 y.o. year old female who presents today for DM f/u.  1. DM: Not checking blood sugar on a regular basis but does sporadically check.  Fasting sugars have been in the upper 100s.  No lows.  Trying to pack lunch to work and bring healthy snacks but works third shift so is hard to regulate sleep/eating.  Has switched to diet soda.  Not exercising due to knee pain.  2. HTN: not checking at home.  No problems with hypotension/orthostasis.  Has not taken meds yet this AM.  No chest pain/sob.  No LE edema  Patient's past medical, social, and family history were reviewed and updated as appropriate. History  Substance Use Topics  . Smoking status: Former Smoker    Quit date: 05/03/1998  . Smokeless tobacco: Not on file  . Alcohol Use: Not on file   Objective:  Filed Vitals:   12/29/11 1050  BP: 156/89  Pulse: 92  Temp: 97.8 F (36.6 C)   Gen: Obese, no distress CV: RRR, no murmurs Resp: CTABL Ext: No edema  Assessment/Plan:  Please also see individual problems in problem list for problem-specific plans.

## 2011-12-29 NOTE — Assessment & Plan Note (Signed)
BG is somewhat improved but still have a fair amount of room to go.  Is on relatively maximal oral therapy.  Will encourage exercise/weight loss and give another 1-2 months to see if there is more complete control.  If not would be looking at starting insulin.  No changes in medications today.  At next appointment could consider increasing metformin to 750xl.

## 2011-12-29 NOTE — Assessment & Plan Note (Signed)
No changes currently.  Will have patient monitor and make certain to take bp meds prior to coming in next time.  Adjust then if necessary.

## 2012-01-07 ENCOUNTER — Ambulatory Visit: Payer: 59 | Admitting: *Deleted

## 2012-01-26 ENCOUNTER — Encounter: Payer: Self-pay | Admitting: Home Health Services

## 2012-01-28 ENCOUNTER — Encounter: Payer: Self-pay | Admitting: Home Health Services

## 2012-04-07 ENCOUNTER — Other Ambulatory Visit: Payer: Self-pay | Admitting: Family Medicine

## 2012-06-02 ENCOUNTER — Encounter: Payer: Self-pay | Admitting: *Deleted

## 2012-07-05 ENCOUNTER — Encounter: Payer: Self-pay | Admitting: *Deleted

## 2012-09-28 ENCOUNTER — Encounter: Payer: Self-pay | Admitting: Family Medicine

## 2012-09-28 ENCOUNTER — Ambulatory Visit (INDEPENDENT_AMBULATORY_CARE_PROVIDER_SITE_OTHER): Payer: 59 | Admitting: Family Medicine

## 2012-09-28 VITALS — BP 160/80 | HR 94 | Temp 98.0°F | Ht 66.0 in | Wt 232.6 lb

## 2012-09-28 DIAGNOSIS — E1140 Type 2 diabetes mellitus with diabetic neuropathy, unspecified: Secondary | ICD-10-CM | POA: Insufficient documentation

## 2012-09-28 DIAGNOSIS — E1149 Type 2 diabetes mellitus with other diabetic neurological complication: Secondary | ICD-10-CM

## 2012-09-28 DIAGNOSIS — E1142 Type 2 diabetes mellitus with diabetic polyneuropathy: Secondary | ICD-10-CM

## 2012-09-28 DIAGNOSIS — E1165 Type 2 diabetes mellitus with hyperglycemia: Secondary | ICD-10-CM

## 2012-09-28 DIAGNOSIS — E785 Hyperlipidemia, unspecified: Secondary | ICD-10-CM

## 2012-09-28 DIAGNOSIS — I10 Essential (primary) hypertension: Secondary | ICD-10-CM

## 2012-09-28 LAB — POCT GLYCOSYLATED HEMOGLOBIN (HGB A1C): Hemoglobin A1C: >14

## 2012-09-28 LAB — BASIC METABOLIC PANEL
BUN: 14 mg/dL (ref 6–23)
CO2: 26 mEq/L (ref 19–32)
Chloride: 101 mEq/L (ref 96–112)
Creat: 0.59 mg/dL (ref 0.50–1.10)

## 2012-09-28 MED ORDER — GLIPIZIDE ER 10 MG PO TB24: 10.0000 mg | ORAL_TABLET | Freq: Every day | ORAL | Status: DC

## 2012-09-28 MED ORDER — INSULIN GLARGINE 100 UNIT/ML SOLOSTAR PEN: PEN_INJECTOR | SUBCUTANEOUS | Status: DC

## 2012-09-28 MED ORDER — LISINOPRIL-HYDROCHLOROTHIAZIDE 20-25 MG PO TABS: 1.0000 | ORAL_TABLET | Freq: Every day | ORAL | Status: DC

## 2012-09-28 MED ORDER — INSULIN PEN NEEDLE 31G X 5 MM MISC: Status: DC

## 2012-09-28 NOTE — Patient Instructions (Signed)
Ms. Rapley, it was nice seeing you today.  For your diabetes, we will restart your glipizide and start you on long acting insulin called Lantus.  You will start at 10 U in the morning and check your fasting sugars every morning before taking the medication.  Please write these down and we will take a look at them at your next visit.  You will increase your Lantus by 2 U every morning that your sugars are >140.  So if your sugar is 300 one day, you will go up to 12 U.  If it is 250 the next day, you will go up to 14 U.  If it is 200 the next day, you will go up to 16 U and so on.  Thank you, Dr. Paulina Fusi

## 2012-09-28 NOTE — Assessment & Plan Note (Signed)
Paraesthesias in the lower extremities into the toes.  Will try to better manage her DM and can start on low dose gabapentin if needed.

## 2012-09-28 NOTE — Assessment & Plan Note (Addendum)
Will get Direct LDL at next visit, regardless will need to be on High dose statin (Lipitor 40 mg).  Direct LDL will be baseline and reassess compliance in future.

## 2012-09-28 NOTE — Assessment & Plan Note (Signed)
BP elevated to 159/92 today and recheck 160/90.  Not taking medications currently but will restart Zestoretic and check BMET today.  If creatinine ok, will recheck in about 4 weeks.

## 2012-09-28 NOTE — Progress Notes (Signed)
Virginia [name] is a 42 y.o. female who presents today for DM II, HTN, and diabetic neuropathy, and hyperlipidemia  DM II, uncontrolled- Pt states that she has not been taking her medications (Metformin XL 750 mg qd and glipizide 5 mg TID) for the last 2-3 months because she ran out and did not want to come to the doctor to refill these.  She states that she has severe GI upset/diarrhea with the metformin and does not wish to restart this.  As well, her last A1C in October 2013 was >14, and she states she has not been exercising or had changes in her diet since then.   Diabetic Neuropathy - pt describing parasthesias in her distal lower extremities described as sharp intermittent shooting pains unrelieved by tylenol, rest, or position.  These have been ongoing for the last 6-12 months and have worsened since that time.  They happen several times per day and describes any other sensations like weakness numbness or trauma.  HTN - Non adherent with medications, denies palpations, lightheaded, HA, blurred vision, CP, SOB.    HLD - Non adherent with medications, previously Rx Lipitor 40 mg that she was not taking.  She is unsure of her last fasting lipid profile.   Past Medical History  Diagnosis Date  . Diabetes mellitus   . Obesity   . Hypertension     History  Smoking status  . Former Smoker  . Quit date: 05/03/1998  Smokeless tobacco  . Not on file    Family History  Problem Relation Age of Onset  . Diabetes Mother   . Heart disease Mother   . Hypertension Mother   . Hyperlipidemia Mother   . Cancer Mother   . Diabetes Father   . Heart disease Father   . Hypertension Father   . Hyperlipidemia Father     Current Outpatient Prescriptions on File Prior to Visit  Medication Sig Dispense Refill  . cephALEXin (KEFLEX) 500 MG capsule Take 1 capsule (500 mg total) by mouth 2 (two) times daily.  20 capsule  0  . glucose blood test strip Use as instructed  100 each  12  . Lancets  (ONETOUCH ULTRASOFT) lancets Use to check blood sugar up to 3 times daily.  DG code 250.0  100 each  12  . Multiple Vitamins-Minerals (MULTIVITAMIN WITH MINERALS) tablet Take 1 tablet by mouth daily.       No current facility-administered medications on file prior to visit.    ROS: Per HPI.  All other systems reviewed and are negative.   Physical Exam Filed Vitals:   09/28/12 0925  BP: 162/89  Pulse: 94  Temp: 98 F (36.7 C)    Physical Examination: General appearance - overweight Chest - CTA B/L  Heart - normal rate and regular rhythm, no murmurs appreciated  Neurological - no focal deficits  Musculoskeletal - full range of motion without pain of her ankle joints    Lab Results  Component Value Date   HGBA1C >14.0 09/28/2012

## 2012-09-28 NOTE — Assessment & Plan Note (Addendum)
Pt has not seen a physician since her appointment w/ Dr. Louanne Belton about 10 months ago.  Her A1C at that time was >14, and is again today.  Has not taken her medications including Metformin or glipizide in over 2 months.  Having severe GI effects from Metformin XL and does not want to take this.  Will restart glucotrol XL 10 mg today as well as Lantus 10 U and explained titration to her.  She will f/u with Dr. Raymondo Band for this as well, and most likely will need help managing this pt.  Most likely will need to be on mealtime insulin but will slowly titrate up the Lantus.  She may be a good candidate for Byetta XL vs Victoza due to insurance if she is willing to do another injection instead of mealtime coverage.  In regards to other co-morbidities, her ASCVD > 7.5% and will need to be on high dose statin as well as daily ASA.  Will address all of these at her next visit.  As well she could benefit from nutrition consult and diabetes counseling.  Will recommend pt see Dr. Gerilyn Pilgrim at next visit which could help with her diabetes and possible referral to diabetes clinic.

## 2012-10-20 ENCOUNTER — Other Ambulatory Visit: Payer: Self-pay

## 2012-10-21 ENCOUNTER — Ambulatory Visit (INDEPENDENT_AMBULATORY_CARE_PROVIDER_SITE_OTHER): Payer: 59 | Admitting: Pharmacist

## 2012-10-21 ENCOUNTER — Encounter: Payer: Self-pay | Admitting: Pharmacist

## 2012-10-21 VITALS — BP 141/88 | HR 91 | Ht 66.0 in | Wt 229.0 lb

## 2012-10-21 DIAGNOSIS — E1165 Type 2 diabetes mellitus with hyperglycemia: Secondary | ICD-10-CM

## 2012-10-21 MED ORDER — GLUCOSE BLOOD VI STRP: ORAL_STRIP | Status: DC

## 2012-10-21 NOTE — Assessment & Plan Note (Signed)
Diabetes diagnosed in 2003 currently with significantly improved since addition of Lantus and likely improved since last A1C. Lab Results  Component Value Date   HGBA1C >14.0 09/28/2012  Home fasting CBG readings of ~100-150 appear to be in goal range.   Reports symptoms of hypoglycemia with CBG of 90.  Able to verbalize appropriate hypoglycemia management plan.  Reports adherence with medication.    Control is improved based on AM fasting CBGs.   Continued basal insulin Lantus (insulin glargine) at same dose.  Instructed we may need to make reductions by 2 units if she has recurrent symptomatic low episodes.   She was encourage to exercise and work on her weight loss plan to achieve a more ideal weight.  Written patient instructions provided.  Follow up in Pharmacist Clinic Visit after next visit with Dr. Paulina Fusi.   Total time in face to face counseling 35 minutes.

## 2012-10-21 NOTE — Patient Instructions (Addendum)
Keep up the great work with walking!  Check some blood glucose readings 2 hours after meals.   Please continue 22 units of insulin daily in the PM You can decrease by 2 units as needed for low readings <100  Next visit with Dr. Paulina Fusi Then in 1-2 months with Pharmacy clinic.

## 2012-10-21 NOTE — Progress Notes (Signed)
S:    Patient arrives in good spirits.  She reports that she is feeling less nocturia since starting Lantus Insulin.   She reports taking 22 units of Insulin.   She experienced one episode of "feeling low" at work and her blood sugar was checked and it was 90.      She presents to the clinic for diabetes follow up after initiation of Lantus.   She has stopped her Metformin.   Patient reports having history of Diabetes since the year of 2003 Patient has taken oral agents (metformin- caused GI Sx) and Glipizide.    O:  Home CBGs <200 and routinely ~150.  At work BP reported to be 130/80   A/P: Diabetes diagnosed in 2003 currently with significantly improved since addition of Lantus and likely improved since last A1C. Lab Results  Component Value Date   HGBA1C >14.0 09/28/2012  Home fasting CBG readings of ~100-150 appear to be in goal range.   Reports symptoms of hypoglycemia with CBG of 90.  Able to verbalize appropriate hypoglycemia management plan.  Reports adherence with medication.    Control is improved based on AM fasting CBGs.   Continued basal insulin Lantus (insulin glargine) at same dose.  Instructed we may need to make reductions by 2 units if she has recurrent symptomatic low episodes.   She was encourage to exercise and work on her weight loss plan to achieve a more ideal weight.  Written patient instructions provided.  Follow up in Pharmacist Clinic Visit after next visit with Dr. Paulina Fusi.   Total time in face to face counseling 35 minutes.

## 2012-10-21 NOTE — Addendum Note (Signed)
Addended by: Kathrin Ruddy on: 10/21/2012 02:42 PM   Modules accepted: Orders

## 2012-10-22 NOTE — Progress Notes (Signed)
Patient ID: Virginia [name], female   DOB: [birth year], 42 y.o.   MRN: [id number] Reviewed: Agree with Dr. Macky Lower documentation and management.

## 2012-12-28 ENCOUNTER — Ambulatory Visit (INDEPENDENT_AMBULATORY_CARE_PROVIDER_SITE_OTHER): Payer: 59 | Admitting: Family Medicine

## 2012-12-28 ENCOUNTER — Encounter: Payer: Self-pay | Admitting: Family Medicine

## 2012-12-28 VITALS — BP 159/89 | HR 90 | Temp 97.7°F | Ht 66.0 in | Wt 242.0 lb

## 2012-12-28 DIAGNOSIS — I10 Essential (primary) hypertension: Secondary | ICD-10-CM

## 2012-12-28 DIAGNOSIS — E1165 Type 2 diabetes mellitus with hyperglycemia: Secondary | ICD-10-CM

## 2012-12-28 DIAGNOSIS — E785 Hyperlipidemia, unspecified: Secondary | ICD-10-CM

## 2012-12-28 LAB — POCT GLYCOSYLATED HEMOGLOBIN (HGB A1C): Hemoglobin A1C: 9.1

## 2012-12-28 MED ORDER — ATORVASTATIN CALCIUM 40 MG PO TABS: 40.0000 mg | ORAL_TABLET | Freq: Every day | ORAL | Status: DC

## 2012-12-28 MED ORDER — HYDROCHLOROTHIAZIDE 25 MG PO TABS: 25.0000 mg | ORAL_TABLET | Freq: Every day | ORAL | Status: DC

## 2012-12-28 NOTE — Assessment & Plan Note (Addendum)
Pt will need to be on high dose statin (Lipitor 40 mg) secondary to ASCVD >7.5 and DM II.  Will start today and check CMET in one month.

## 2012-12-28 NOTE — Assessment & Plan Note (Addendum)
BP continues to be elevated to around 160 systolic, 90 diastolic, despite taking zestoretic.  She is having cough secondary to the lisinopril component, so will stop the medication and increase her HCTZ to 25 mg alone.  As well, we will see back in one month and recheck a CMET, Lipid Profile, and UA to evaluate for possible diabetic nephropathy which would be more indicative at that time to start her on an ARB, Cozaar for example, especially if her BP continues to be > 140/90.

## 2012-12-28 NOTE — Patient Instructions (Signed)
Virginia [name], it was nice seeing you today.  Please make an appointment with Dr. Raymondo Band in about 3 weeks, at that time you can also get your lab work done (please do not eat anything for greater than 8 hours).  I will see you back shortly after that, about one week, at which time we can discuss your blood pressure and your diabetes.  Thanks, Dr. Paulina Fusi

## 2012-12-28 NOTE — Assessment & Plan Note (Signed)
Her A1C is much improved over the last three months, coming down to 9.1 from >14.  She is compliant with her Lantus, on 22 U in AM, which she also denies hypoglycemic episodes.  Will have her f/u with Dr. Raymondo Band in about 3-4 weeks, at that point, to have labs drawn, and will f/u the following week with myself.  Will check creatinine and UA for nephropathy when she is off of her lisinopril.

## 2012-12-28 NOTE — Progress Notes (Signed)
Virginia [name] is a 42 y.o. female who presents today for HTN, DM II, Hyperlipidemia, cough.   Cough - Began approximately two months ago, same time she restarted lisinopril.  It happens intermittently throughout the day, denies associated Sx including fever, chills, sweats, weight loss, productive cough.  She has tried OTC medications without relief, cough drops with minimal relief, no recent travel, changes in work, changes in home environment.  She denies ever having this before and was previous smoker, but stopped 14 yrs ago.  Previous Hx of + PPD that was subsequently f/u with CXR which was nml about 6 yrs ago.    DM II - Her A1C is much improved over the last three months, coming down to 9.1 from >14.  She is compliant with her Lantus, on 22 U in AM, which she also denies hypoglycemic episodes.    HTN - BP elevated again today to 160/90, states she is taking Zestoretic compliantly.  Is having cough, but otherwise denies edema, allergic reactions, palpitations, fatigue, HA, blurred vision.     Past Medical History  Diagnosis Date  . Diabetes mellitus   . Obesity   . Hypertension     History  Smoking status  . Former Smoker  . Quit date: 05/03/1998  Smokeless tobacco  . Never Used    Comment: Works at [employer] - AND does NOT smoke.     Family History  Problem Relation Age of Onset  . Diabetes Mother   . Heart disease Mother   . Hypertension Mother   . Hyperlipidemia Mother   . Cancer Mother   . Diabetes Father   . Heart disease Father   . Hypertension Father   . Hyperlipidemia Father     Current Outpatient Prescriptions on File Prior to Visit  Medication Sig Dispense Refill  . glipiZIDE (GLIPIZIDE XL) 10 MG 24 hr tablet Take 1 tablet (10 mg total) by mouth daily.  30 tablet  5  . glucose blood test strip Use as instructed.   Dispense one month supply in QS for twice daily testing.  100 each  12  . Insulin Glargine 100 UNIT/ML SOPN 22 units in the AM      . Insulin Pen  Needle 31G X 5 MM MISC Use one needle per application  100 each  10  . Lancets (ONETOUCH ULTRASOFT) lancets Use to check blood sugar up to 3 times daily.  DG code 250.0  100 each  12  . Multiple Vitamins-Minerals (MULTIVITAMIN WITH MINERALS) tablet Take 1 tablet by mouth daily.       No current facility-administered medications on file prior to visit.    ROS: Per HPI.  All other systems reviewed and are negative.   Physical Exam Filed Vitals:   12/28/12 0940  BP: 159/89  Pulse: 90  Temp: 97.7 F (36.5 C)    Physical Examination: General appearance - overweight  Chest - CTA B/L  Heart - normal rate and regular rhythm, no murmurs appreciated  Neurological - no focal deficits      Chemistry      Component Value Date/Time   NA 134* 09/28/2012 1023   K 3.9 09/28/2012 1023   CL 101 09/28/2012 1023   CO2 26 09/28/2012 1023   BUN 14 09/28/2012 1023   CREATININE 0.59 09/28/2012 1023   CREATININE 1.26* 05/01/2010 2235      Component Value Date/Time   CALCIUM 9.5 09/28/2012 1023   ALKPHOS 136* 05/01/2010 2235   AST 12  05/01/2010 2235   ALT 12 05/01/2010 2235   BILITOT 0.3 05/01/2010 2235     Lab Results  Component Value Date   HGBA1C 9.1 12/28/2012

## 2013-01-20 ENCOUNTER — Other Ambulatory Visit: Payer: Self-pay

## 2013-01-22 ENCOUNTER — Ambulatory Visit (INDEPENDENT_AMBULATORY_CARE_PROVIDER_SITE_OTHER): Payer: 59 | Admitting: Family Medicine

## 2013-01-22 VITALS — BP 120/80 | HR 88 | Temp 97.9°F | Resp 16 | Ht 65.5 in | Wt 239.0 lb

## 2013-01-22 DIAGNOSIS — M25519 Pain in unspecified shoulder: Secondary | ICD-10-CM

## 2013-01-22 DIAGNOSIS — E1165 Type 2 diabetes mellitus with hyperglycemia: Secondary | ICD-10-CM

## 2013-01-22 DIAGNOSIS — M7542 Impingement syndrome of left shoulder: Secondary | ICD-10-CM

## 2013-01-22 DIAGNOSIS — M25512 Pain in left shoulder: Secondary | ICD-10-CM

## 2013-01-22 MED ORDER — MELOXICAM 7.5 MG PO TABS: 7.5000 mg | ORAL_TABLET | Freq: Every day | ORAL | Status: DC

## 2013-01-22 NOTE — Patient Instructions (Addendum)
Sling as needed, mobic once to twice per day for pain and inflammation. Try the pendulum exercises and range of motion as discussed and recheck next week. You could have a frozen shoulder, so may need to see orthopaedic doctor if this does not improve. Return to the clinic or go to the nearest emergency room if any of your symptoms worsen or new symptoms occur. Please follow up Thursday January 27, 2013 between the hours of 8am-4pm.  Thanks

## 2013-01-22 NOTE — Progress Notes (Signed)
Subjective:  This chart was scribed for Meredith Staggers, MD by Quintella Reichert, ED scribe.  This patient was seen in room [facility] Room 4 and the patient's care was started at 12:23 PM.   Patient ID: Virginia [name], female    DOB: 1970/08/26, 42 y.o.   MRN: [id number]  Chief Complaint  Patient presents with  . Shoulder Pain    (L) shoulder x 1 week   HPI  Virginia [name] is a 42 y.o. female PCP: Gildardo Cranker, DO  Pt presents with progressively-worsening left shoulder pain and stiffness that began on waking 8 days ago.  She states she feels unable to move the shoulder.  She also notes some intermittent radiation of pain into the left hand.  She denies radiation today.  She denies weakness in the hand.  Pain is worsened by attempting to move the shoulder and by bending her elbow.  It is relieved somewhat by a hot shower.  She has also attempted to treat symptoms with ibuprofen, with some temporary relief.  Pt pushes heavy objects with her arms all day at work but denies injury, fall, or unusual activities that may have brought on symptoms.  She denies prior h/o similar symptoms.    Pt has h/o type 2 DM.  Prior A1C greater than 14, down to 9.1 at October 14th PCP visit.     Patient Active Problem List   Diagnosis Date Noted  . Diabetic neuropathy 09/28/2012  . CERVICAL POLYP 02/14/2010  . HYPERLIPIDEMIA 03/06/2008  . DM (diabetes mellitus), type 2, uncontrolled 05/14/2006  . OBESITY, NOS 05/14/2006  . HYPERTENSION, BENIGN SYSTEMIC 05/14/2006   Past Medical History  Diagnosis Date  . Diabetes mellitus   . Obesity   . Hypertension    Past Surgical History  Procedure Laterality Date  . Cholecystectomy    . Tubal ligation     Allergies  Allergen Reactions  . Shrimp [Shellfish Allergy]    Prior to Admission medications   Medication Sig Start Date End Date Taking? Authorizing Provider  atorvastatin (LIPITOR) 40 MG tablet Take 1 tablet (40 mg total) by mouth daily. 12/28/12  Yes Bryan R  Hess, DO  glipiZIDE (GLIPIZIDE XL) 10 MG 24 hr tablet Take 1 tablet (10 mg total) by mouth daily. 09/28/12  Yes Bryan R Hess, DO  glucose blood test strip Use as instructed.   Dispense one month supply in QS for twice daily testing. 10/21/12  Yes Sanjuana Letters, MD  hydrochlorothiazide (HYDRODIURIL) 25 MG tablet Take 1 tablet (25 mg total) by mouth daily. 12/28/12  Yes Bryan R Hess, DO  Insulin Glargine 100 UNIT/ML SOPN 22 units in the AM 09/28/12  Yes Bryan R Hess, DO  Insulin Pen Needle 31G X 5 MM MISC Use one needle per application 09/28/12  Yes Bryan R Hess, DO  Lancets (ONETOUCH ULTRASOFT) lancets Use to check blood sugar up to 3 times daily.  DG code 250.0 12/05/11  Yes Brent Bulla, MD  Multiple Vitamins-Minerals (MULTIVITAMIN WITH MINERALS) tablet Take 1 tablet by mouth daily.   Yes Historical Provider, MD   History   Social History  . Marital Status: Married    Spouse Name: N/A    Number of Children: N/A  . Years of Education: N/A   Occupational History  . Not on file.   Social History Main Topics  . Smoking status: Former Smoker    Quit date: 05/03/1998  . Smokeless tobacco: Never Used     Comment:  Works at [employer] - AND does NOT smoke.   Marland Kitchen Alcohol Use: Not on file  . Drug Use: Not on file  . Sexual Activity: Not on file   Other Topics Concern  . Not on file   Social History Narrative  . No narrative on file      Review of Systems  Constitutional: Negative for fever, chills and unexpected weight change.  Musculoskeletal: Positive for arthralgias (left shoulder). Negative for neck pain and neck stiffness.  Skin: Negative for rash.       Objective:   Physical Exam  Nursing note and vitals reviewed. Constitutional: She is oriented to person, place, and time. She appears well-developed and well-nourished. No distress.  HENT:  Head: Normocephalic and atraumatic.  Eyes: EOM are normal.  Neck: Normal range of motion. Neck supple. No tracheal deviation  present.  C-spine full ROM, nontender, no paraspinal spasm  Cardiovascular: Normal rate.   Pulmonary/Chest: Effort normal. No respiratory distress.  Musculoskeletal:  Virginia [name] and AC joints nontender, clavicle nontender No appreciable tenderness on palpation to left shoulder. Neurovascularly intact distally to fingertips.  Grip strength equal on the left. Elbows full ROM. Active ROM of left shoulder: 10-15 degrees of flexion.  Full extension and equal internal rotation to scapula.  Abduction of left shoulder approximately 20-30 degrees.  Passive flexion limited to 90 degrees due to pain.  Passive abduction approximately 60 degrees. Rotator cuff testing appears intact except guarding for supraspinatus due to decreased ROM. Negative liftoff.   Neurological: She is alert and oriented to person, place, and time.  Skin: Skin is warm and dry. No rash noted.  Psychiatric: She has a normal mood and affect. Her behavior is normal.     Filed Vitals:   01/22/13 1140  BP: 120/80  Pulse: 88  Temp: 97.9 F (36.6 C)  TempSrc: Oral  Resp: 16  Height: 5' 5.5" (1.664 m)  Weight: 239 lb (108.41 kg)  SpO2: 100%      Assessment & Plan:  [name] is a 42 y.o. female  Left shoulder pain - Plan: meloxicam (MOBIC) 7.5 MG tablet  Impingement syndrome of shoulder region, left - Plan: meloxicam (MOBIC) 7.5 MG tablet  DM (diabetes mellitus), type 2, uncontrolled  Suspected impingement of nondominant shoulder, ? Overuse at work, with RTC tendonitis, vs possible adhesive capsulitis with difficulty with passive ROM and uncontrolled DM2. Will try Mobic 7.5mg  -15mg  qd - short course, SED and discussed goal of minimal use with underlying HTN and DM2.  Pendulum exercises, to wall crawl/ROM, sling if needed and note for work. Recheck in 1 week. Consider subacromial injection vs ortho eval if still suspicious for adhesive capsulitis. rtc precautions discussed.   I personally performed the services described in this  documentation, which was scribed in my presence. The recorded information has been reviewed and considered, and addended by me as needed.   Meds ordered this encounter  Medications  . meloxicam (MOBIC) 7.5 MG tablet    Sig: Take 1-2 tablets (7.5-15 mg total) by mouth daily.    Dispense:  30 tablet    Refill:  0   Patient Instructions  Sling as needed, mobic once to twice per day for pain and inflammation. Try the pendulum exercises and range of motion as discussed and recheck next week. You could have a frozen shoulder, so may need to see orthopaedic doctor if this does not improve. Return to the clinic or go to the nearest emergency room if any of your  symptoms worsen or new symptoms occur. Please follow up Thursday January 27, 2013 between the hours of 8am-4pm.  Thanks

## 2013-03-25 ENCOUNTER — Ambulatory Visit (INDEPENDENT_AMBULATORY_CARE_PROVIDER_SITE_OTHER): Payer: 59 | Admitting: Family Medicine

## 2013-03-25 ENCOUNTER — Encounter: Payer: Self-pay | Admitting: Family Medicine

## 2013-03-25 VITALS — BP 123/83 | HR 91 | Temp 98.1°F | Wt 244.0 lb

## 2013-03-25 DIAGNOSIS — M7500 Adhesive capsulitis of unspecified shoulder: Secondary | ICD-10-CM

## 2013-03-25 DIAGNOSIS — M7502 Adhesive capsulitis of left shoulder: Secondary | ICD-10-CM

## 2013-03-25 DIAGNOSIS — M7580 Other shoulder lesions, unspecified shoulder: Secondary | ICD-10-CM | POA: Insufficient documentation

## 2013-03-25 NOTE — Assessment & Plan Note (Signed)
New issue started last October. Severely reduced ROM and signs of rotator cuff syndrome present.  Referred to SM since possibility of Rotator Cuff Injury/Tear is in her differential. Exercises to try to rehabilitate her shoulder are explained but we instructed to postpone them until SM eval is done.   Pt agreed with plan.

## 2013-03-25 NOTE — Patient Instructions (Signed)
Adhesive Capsulitis Sometimes the shoulder becomes stiff and is painful to move. Some people say it feels as if the shoulder is frozen in place. Because of this, the condition is called "frozen shoulder." Its medical name is adhesive capsulitis.  The shoulder joint is made up of strong connective tissue that attaches the ball of the humerus to the shallow shoulder socket. This strong connective tissue is called the joint capsule. This tissue can become stiff and swollen. That is when adhesive capsulitis sets in. CAUSES  It is not always clear just what the cause adhesive capsulitis. Possibilities include:  Injury to the shoulder joint.  Strain. This is a repetitive injury brought about by overuse.  Lack of use. Perhaps your arm or hand was otherwise injured. It might have been in a sling for awhile. Or perhaps you were not using it to avoid pain.  Referred pain. This is a sort of trick the body plays. You feel pain in the shoulder. But, the pain actually comes from an injury somewhere else in the body.  Long-standing health problems. Several diseases can cause adhesive capsulitis. They include diabetes, heart disease, stroke, thyroid problems, rheumatoid arthritis and lung disease.  Being a women older than 40. Anyone can develop adhesive capsulitis but it is most common in women in this age group. SYMPTOMS   Pain.  It occurs when the arm is moved.  Parts of the shoulder might hurt if they are touched.  Pain is worse at night or when resting.  Soreness. It might not be strong enough to be called pain. But, the shoulder aches.  The shoulder does not move freely.  Muscle spasms.  Trouble sleeping because of shoulder ache or pain. DIAGNOSIS  To decide if you have adhesive capsulitis, your healthcare provider will probably:  Ask about symptoms you have noticed.  Ask about your history of joint pain and anything that might have caused the pain.  Ask about your overall  health.  Use hands to feel your shoulder and neck.  Ask you to move your shoulder in specific directions. This may indicate the origin of the pain.  Order imaging tests; pictures of the shoulder. They help pinpoint the source of the problem. An X-ray might be used. For more detail, an MRI is often used. An MRI details the tendons, muscles and ligaments as well as the joint. TREATMENT  Adhesive capsulitis can be treated several ways. Most treatments can be done in a clinic or in your healthcare provider's office. Be sure to discuss the different options with your caregiver. They include:  Physical therapy. You will work on specific exercises to get your shoulder moving again. The exercises usually involve stretching. A physical therapist (a caregiver with special training) can show you what to do and what not to do. The exercises will need to be done daily.  Medication.  Over-the-counter medicines may relieve pain and inflammation (the body's way of reacting to injury or infection).  Corticosteroids. These are stronger drugs to reduce pain and inflammation. They are given by injection (shots) into the shoulder joint. Frequent treatment is not recommended.  Muscle relaxants. Medication may be prescribed to ease muscle spasms.  Treatment of underlying conditions. This means treating another condition that is causing your shoulder problem. This might be a rotator cuff (tendon) problem  Shoulder manipulation. The shoulder will be moved by your healthcare provider. You would be under general anesthesia (given a drug that puts you to sleep). You would not feel anything. Sometimes   the joint will be injected with salt water (saline) at high pressure to break down internal scarring in the joint capsule.  Surgery. This is rarely needed. It may be suggested in advanced cases after all other treatment has failed. PROGNOSIS  In time, most people recover from adhesive capsulitis. Sometimes, however, the  pain goes away but full movement of the shoulder does not return.  HOME CARE INSTRUCTIONS   Take any pain medications recommended by your healthcare provider. Follow the directions carefully.  If you have physical therapy, follow through with the therapist's suggestions. Be sure you understand the exercises you will be doing. You should understand:  How often the exercises should be done.  How many times each exercise should be repeated.  How long they should be done.  What other activities you should do, or not do.  That you should warm up before doing any exercise. Just 5 to 10 minutes will help. Small, gentle movements should get your shoulder ready for more.  Avoid high-demand exercise that involves your shoulder such as throwing. This type of exercise can make pain worse.  Consider using cold packs. Cold may ease swelling and pain. Ask your healthcare provider if a cold pack might help you. If so, get directions on how and when to use them. SEEK MEDICAL CARE IF:   You have any questions about your medications.  Your pain continues to increase. Document Released: 12/29/2008 Document Revised: 05/26/2011 Document Reviewed: 12/29/2008 Exit[employer] Patient Information 2014 [employer].  

## 2013-03-25 NOTE — Progress Notes (Signed)
Family Medicine Office Visit Note   Subjective:   Patient ID: Virginia [name], female  DOB: 05/28/1970, 43 y.o.. MRN: [id number]   Pt that comes today for same day appointment complaining of left shoulder soreness and decreased in motion. She reports in October/2014 she felt a sudden pain like she had pulled a muscle. She reports went to Urgent Care and they prescribed muscular relaxant and antiinflammatory that relieved her pain. For the past month she has been experiencing difficulty in doing activities that require overhead reach with her left hand. She denies bruising, redness or noticeable swelling. Her soreness is "all over the shoulder" and no specific area is identified. She denies numbness or tingling. No weakness on her grip, but at the level of shoulder she uses her right hand to aid with combing her hair or dressing.   Review of Systems:  Pt denies fever, chills, nausea, SOB, chest pain, palpitations, headaches, dizziness.  Objective:   Physical Exam: Gen:  NAD HEENT: Moist mucous membranes  MSK: left Shoulder: severely decreased active ROM (30 degree extension. 45 degree abduction) empty can sign is positive on her left. Point tenderness on posterior aspect of shoulder. Neurovascular is intact.   Assessment & Plan:

## 2013-04-25 ENCOUNTER — Ambulatory Visit
Admission: RE | Admit: 2013-04-25 | Discharge: 2013-04-25 | Disposition: A | Discharge status: Home / Self Care | Payer: 59 | Source: Ambulatory Visit | Attending: Sports Medicine | Admitting: Sports Medicine

## 2013-04-25 ENCOUNTER — Ambulatory Visit (INDEPENDENT_AMBULATORY_CARE_PROVIDER_SITE_OTHER): Payer: 59 | Admitting: Sports Medicine

## 2013-04-25 ENCOUNTER — Encounter: Payer: Self-pay | Admitting: Sports Medicine

## 2013-04-25 VITALS — BP 166/112 | Ht 66.0 in | Wt 243.0 lb

## 2013-04-25 DIAGNOSIS — M7580 Other shoulder lesions, unspecified shoulder: Secondary | ICD-10-CM

## 2013-04-25 DIAGNOSIS — M67919 Unspecified disorder of synovium and tendon, unspecified shoulder: Secondary | ICD-10-CM

## 2013-04-25 DIAGNOSIS — M719 Bursopathy, unspecified: Secondary | ICD-10-CM

## 2013-04-25 MED ORDER — MELOXICAM 7.5 MG PO TABS: 7.5000 mg | ORAL_TABLET | Freq: Every day | ORAL | Status: DC

## 2013-04-25 NOTE — Progress Notes (Signed)
         Virginia [name] is a 43 y.o. female who [facility] L shoulder pain  Onset of pain around end of October. Sudden onset after work one day. No h/o trauma. Pain is achy and dull and can be sharp w/ certain movement. Unable to abduct arm w/o pain. Improved overall since October. Worse w/ certain movement. Ibuprofen 400mg  w/ some relief. Initially w/ radiculopathy down arm but now pain is all localized to L shoulder.   PMH reviewed.  ROS as above otherwise neg  Medications reviewed.   Exam:  BP 166/112  Ht 5\' 6"  (1.676 m)  Wt 243 lb (110.224 kg)  BMI 39.24 kg/m2  Gen: Well NAD  MSK: ROM limited on L due to pain and weakness w/ abduction to 45 degrees and w/ flexion to 90 degrees. Passive FROM of L shoulder but painful. Upper bicep ttp on L, w/o other ttp. No swelling. Hawkins negative, empty can + on left. Internal and external rotation w/o pain.  No results found.    Assessment and Plan:  1) Rotator cuff tendonopathy vs tear  - Meloxicam, tylenol (renal and liver fxn nml)  - start formal PT  - Shoulder Xray  - return in 4 wks  - consider further diagnostic imaging if no improvement; initially with ultrasound with consideration to MRI thereafter.    Shelly Flattenavid Yared Barefoot, MD  Family Medicine PGY-3  04/25/2013, 9:24 AM

## 2013-04-25 NOTE — Patient Instructions (Signed)
Thank you for coming in today You have injured your rotator cuff Please got to the physical therapist today to make an appiontment Please use the meloxicam daily as needed for pain Please go get xrays today Please come back in 4 weeks If you are not better we will consider further imaging

## 2013-04-25 NOTE — Assessment & Plan Note (Signed)
Likely rotator cuff tendonopathy but cannot exclude cuff tear - meloxicam, PT, xray and f/u in 4 wks - if DM control improved, will consider steroid injection - will consider in office US if no improvement vs MR

## 2013-05-02 ENCOUNTER — Ambulatory Visit: Payer: 59

## 2013-05-04 ENCOUNTER — Other Ambulatory Visit: Payer: Self-pay | Admitting: Family Medicine

## 2013-05-04 DIAGNOSIS — IMO0002 Reserved for concepts with insufficient information to code with codable children: Secondary | ICD-10-CM

## 2013-05-04 DIAGNOSIS — E1165 Type 2 diabetes mellitus with hyperglycemia: Secondary | ICD-10-CM

## 2013-05-04 MED ORDER — GLIPIZIDE ER 10 MG PO TB24: 10.0000 mg | ORAL_TABLET | Freq: Every day | ORAL | Status: DC

## 2013-05-09 ENCOUNTER — Encounter: Payer: Self-pay | Admitting: Sports Medicine

## 2013-05-09 ENCOUNTER — Ambulatory Visit (INDEPENDENT_AMBULATORY_CARE_PROVIDER_SITE_OTHER): Payer: 59 | Admitting: Sports Medicine

## 2013-05-09 VITALS — BP 136/87 | HR 94 | Ht 66.0 in | Wt 243.0 lb

## 2013-05-09 DIAGNOSIS — M25519 Pain in unspecified shoulder: Secondary | ICD-10-CM

## 2013-05-09 DIAGNOSIS — M719 Bursopathy, unspecified: Secondary | ICD-10-CM

## 2013-05-09 DIAGNOSIS — M67919 Unspecified disorder of synovium and tendon, unspecified shoulder: Secondary | ICD-10-CM

## 2013-05-09 DIAGNOSIS — M7580 Other shoulder lesions, unspecified shoulder: Secondary | ICD-10-CM

## 2013-05-09 NOTE — Progress Notes (Signed)
   Subjective:    Patient ID: Virginia [name], female    DOB: 08/30/1970, 43 y.o.   MRN: [id number]  HPI Patient comes in today at my request. Recent x-rays of her left shoulder showed a slightly high riding humeral head which is concerning for a possible rotator cuff tear. She continues to struggle with pain and weakness in her left shoulder. She has yet to start physical therapy but overall she does not feel like she has made any improvement since her office visit a couple of weeks ago. Please see her previous office note for further details regarding history.    Review of Systems     Objective:   Physical Exam Well-developed, well-nourished. No acute distress. Sitting comfortably in exam room  Left shoulder: Patient has good passive range of motion but active forward flexion and abduction are limited to about 80-90. Positive drop arm test. 3-4/5 strength with resisted supraspinatus. No noticeable atrophy. Neurovascularly intact distally.  Right shoulder: Full painless range of motion. Full-strength. Neurovascular intact distally.  MSK ultrasound of the left shoulder was performed. Patient's body habitus and pain level make this exam difficult to perform. She does appear to have some irregularity of the humeral head as well as some hypoechoic change in the insertion of the supraspinatus tendon. Findings are concerning for a possible rotator cuff tear here.       Assessment & Plan:  Left shoulder pain worrisome for rotator cuff tear  MRI of the left shoulder specifically to rule out a full-thickness rotator cuff tear that may need surgery. I will call her after I review that study. In the meantime she can wait on physical therapy until the results of the MRI are known. We will delineate definitive treatment based on that study.

## 2013-05-15 ENCOUNTER — Ambulatory Visit
Admission: RE | Admit: 2013-05-15 | Discharge: 2013-05-15 | Disposition: A | Discharge status: Home / Self Care | Payer: 59 | Source: Ambulatory Visit | Attending: Sports Medicine | Admitting: Sports Medicine

## 2013-05-15 DIAGNOSIS — M7580 Other shoulder lesions, unspecified shoulder: Secondary | ICD-10-CM

## 2013-05-16 ENCOUNTER — Telehealth: Payer: Self-pay | Admitting: Sports Medicine

## 2013-05-16 ENCOUNTER — Encounter: Payer: Self-pay | Admitting: *Deleted

## 2013-05-16 NOTE — Telephone Encounter (Signed)
I spoke with the patient on the phone today after reviewing the MRI of her left shoulder. Patient has a full-thickness partial width tear through the anterior supraspinatus. It is an insertional tear and measures 14 mm in width with 1 cm of retraction. 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 Based on these findings and her disability I recommended consultation with Dr. Dion SaucierLandau to discuss treatment options. This may ultimately come down to and arthroscopic rotator cuff repair. I'll defer further workup and treatment of this problem to Dr. Dion SaucierLandau. Patient will followup with me when necessary.

## 2013-05-16 NOTE — Patient Instructions (Signed)
DR Mee HivesLANDEAU MURPHY [facility] [address] [place] [phone] TUES MAR 3RD AT 4PM

## 2013-05-18 ENCOUNTER — Other Ambulatory Visit: Payer: Self-pay | Admitting: Orthopedic Surgery

## 2013-05-23 ENCOUNTER — Ambulatory Visit: Payer: 59 | Admitting: Sports Medicine

## 2013-05-30 ENCOUNTER — Encounter (HOSPITAL_BASED_OUTPATIENT_CLINIC_OR_DEPARTMENT_OTHER): Payer: Self-pay | Admitting: *Deleted

## 2013-05-31 ENCOUNTER — Encounter (HOSPITAL_BASED_OUTPATIENT_CLINIC_OR_DEPARTMENT_OTHER)
Admission: RE | Admit: 2013-05-31 | Discharge: 2013-05-31 | Disposition: A | Discharge status: Home / Self Care | Payer: 59 | Source: Ambulatory Visit | Attending: Orthopedic Surgery | Admitting: Orthopedic Surgery

## 2013-05-31 ENCOUNTER — Other Ambulatory Visit: Payer: Self-pay

## 2013-05-31 LAB — BASIC METABOLIC PANEL
BUN: 14 mg/dL (ref 6–23)
CHLORIDE: 96 meq/L (ref 96–112)
CO2: 23 meq/L (ref 19–32)
Calcium: 9.4 mg/dL (ref 8.4–10.5)
Creatinine, Ser: 0.56 mg/dL (ref 0.50–1.10)
GFR calc Af Amer: >90 mL/min (ref >90)
GFR calc non Af Amer: >90 mL/min (ref >90)
GLUCOSE: 374 mg/dL — AB (ref 70–99)
Potassium: 4.2 mEq/L (ref 3.7–5.3)
SODIUM: 134 meq/L — AB (ref 137–147)

## 2013-06-03 ENCOUNTER — Encounter (HOSPITAL_BASED_OUTPATIENT_CLINIC_OR_DEPARTMENT_OTHER): Payer: Self-pay | Admitting: *Deleted

## 2013-06-03 ENCOUNTER — Encounter (HOSPITAL_BASED_OUTPATIENT_CLINIC_OR_DEPARTMENT_OTHER)
Admission: RE | Disposition: A | Discharge status: Home / Self Care | Payer: Self-pay | Source: Ambulatory Visit | Attending: Orthopedic Surgery

## 2013-06-03 ENCOUNTER — Encounter (HOSPITAL_BASED_OUTPATIENT_CLINIC_OR_DEPARTMENT_OTHER): Payer: 59 | Admitting: Anesthesiology

## 2013-06-03 ENCOUNTER — Ambulatory Visit (HOSPITAL_BASED_OUTPATIENT_CLINIC_OR_DEPARTMENT_OTHER): Payer: 59 | Admitting: Anesthesiology

## 2013-06-03 ENCOUNTER — Ambulatory Visit (HOSPITAL_BASED_OUTPATIENT_CLINIC_OR_DEPARTMENT_OTHER)
Admission: RE | Admit: 2013-06-03 | Discharge: 2013-06-03 | Disposition: A | Discharge status: Home / Self Care | Payer: 59 | Source: Ambulatory Visit | Attending: Orthopedic Surgery | Admitting: Orthopedic Surgery

## 2013-06-03 DIAGNOSIS — E669 Obesity, unspecified: Secondary | ICD-10-CM | POA: Insufficient documentation

## 2013-06-03 DIAGNOSIS — Z01810 Encounter for preprocedural cardiovascular examination: Secondary | ICD-10-CM | POA: Insufficient documentation

## 2013-06-03 DIAGNOSIS — Z01812 Encounter for preprocedural laboratory examination: Secondary | ICD-10-CM | POA: Insufficient documentation

## 2013-06-03 DIAGNOSIS — M25819 Other specified joint disorders, unspecified shoulder: Secondary | ICD-10-CM | POA: Insufficient documentation

## 2013-06-03 DIAGNOSIS — M67919 Unspecified disorder of synovium and tendon, unspecified shoulder: Secondary | ICD-10-CM | POA: Insufficient documentation

## 2013-06-03 DIAGNOSIS — M7580 Other shoulder lesions, unspecified shoulder: Secondary | ICD-10-CM

## 2013-06-03 DIAGNOSIS — I10 Essential (primary) hypertension: Secondary | ICD-10-CM | POA: Insufficient documentation

## 2013-06-03 DIAGNOSIS — E119 Type 2 diabetes mellitus without complications: Secondary | ICD-10-CM | POA: Insufficient documentation

## 2013-06-03 DIAGNOSIS — M719 Bursopathy, unspecified: Principal | ICD-10-CM | POA: Insufficient documentation

## 2013-06-03 DIAGNOSIS — Z794 Long term (current) use of insulin: Secondary | ICD-10-CM | POA: Insufficient documentation

## 2013-06-03 DIAGNOSIS — Z87891 Personal history of nicotine dependence: Secondary | ICD-10-CM | POA: Insufficient documentation

## 2013-06-03 DIAGNOSIS — M19019 Primary osteoarthritis, unspecified shoulder: Secondary | ICD-10-CM | POA: Insufficient documentation

## 2013-06-03 DIAGNOSIS — M75122 Complete rotator cuff tear or rupture of left shoulder, not specified as traumatic: Secondary | ICD-10-CM

## 2013-06-03 HISTORY — PX: SHOULDER ARTHROSCOPY WITH ROTATOR CUFF REPAIR AND SUBACROMIAL DECOMPRESSION: SHX5686

## 2013-06-03 HISTORY — DX: Presence of spectacles and contact lenses: Z97.3

## 2013-06-03 LAB — GLUCOSE, CAPILLARY
GLUCOSE-CAPILLARY: 340 mg/dL — AB (ref 70–99)
Glucose-Capillary: 340 mg/dL — ABNORMAL HIGH (ref 70–99)
Glucose-Capillary: 281 mg/dL — ABNORMAL HIGH (ref 70–99)

## 2013-06-03 LAB — POCT HEMOGLOBIN-HEMACUE: Hemoglobin: 12.6 g/dL (ref 12.0–15.0)

## 2013-06-03 SURGERY — SHOULDER ARTHROSCOPY WITH ROTATOR CUFF REPAIR AND SUBACROMIAL DECOMPRESSION
Anesthesia: General | Site: Shoulder | Laterality: Left

## 2013-06-03 MED ORDER — FENTANYL CITRATE 0.05 MG/ML IJ SOLN
50.0000 ug | Freq: Once | INTRAMUSCULAR | Status: DC
Start: 2013-06-03 — End: 2013-06-03

## 2013-06-03 MED ORDER — MIDAZOLAM HCL 2 MG/2ML IJ SOLN
1.0000 mg | INTRAMUSCULAR | Status: DC | PRN
  Administered 2013-06-03: 2 mg via INTRAVENOUS

## 2013-06-03 MED ORDER — LIDOCAINE HCL (CARDIAC) 20 MG/ML IV SOLN
INTRAVENOUS | Status: DC | PRN
  Administered 2013-06-03: 100 mg via INTRAVENOUS

## 2013-06-03 MED ORDER — MIDAZOLAM HCL 2 MG/2ML IJ SOLN: 1.0000 mg | INTRAMUSCULAR | Status: DC | PRN

## 2013-06-03 MED ORDER — OXYCODONE-ACETAMINOPHEN 10-325 MG PO TABS: 1.0000 | ORAL_TABLET | Freq: Four times a day (QID) | ORAL | Status: DC | PRN

## 2013-06-03 MED ORDER — PROMETHAZINE HCL 25 MG PO TABS: 25.0000 mg | ORAL_TABLET | Freq: Four times a day (QID) | ORAL | Status: DC | PRN

## 2013-06-03 MED ORDER — INSULIN ASPART 100 UNIT/ML ~~LOC~~ SOLN
8.0000 [IU] | Freq: Once | SUBCUTANEOUS | Status: AC
  Administered 2013-06-03: 8 [IU] via SUBCUTANEOUS

## 2013-06-03 MED ORDER — PROMETHAZINE HCL 25 MG/ML IJ SOLN
6.2500 mg | INTRAMUSCULAR | Status: DC | PRN
Start: 2013-06-03 — End: 2013-06-03

## 2013-06-03 MED ORDER — SUCCINYLCHOLINE CHLORIDE 20 MG/ML IJ SOLN
INTRAMUSCULAR | Status: DC | PRN
  Administered 2013-06-03: 100 mg via INTRAVENOUS

## 2013-06-03 MED ORDER — METHOCARBAMOL 500 MG PO TABS: 500.0000 mg | ORAL_TABLET | Freq: Four times a day (QID) | ORAL | Status: DC

## 2013-06-03 MED ORDER — SODIUM CHLORIDE 0.9 % IR SOLN
Status: DC | PRN
  Administered 2013-06-03: 14000 mL

## 2013-06-03 MED ORDER — FENTANYL CITRATE 0.05 MG/ML IJ SOLN
50.0000 ug | INTRAMUSCULAR | Status: DC | PRN
  Administered 2013-06-03: 100 ug via INTRAVENOUS

## 2013-06-03 MED ORDER — PROPOFOL 10 MG/ML IV BOLUS
INTRAVENOUS | Status: DC | PRN
  Administered 2013-06-03: 50 mg via INTRAVENOUS
  Administered 2013-06-03: 200 mg via INTRAVENOUS

## 2013-06-03 MED ORDER — DEXAMETHASONE SODIUM PHOSPHATE 10 MG/ML IJ SOLN
INTRAMUSCULAR | Status: DC | PRN
  Administered 2013-06-03: 4 mg

## 2013-06-03 MED ORDER — ONDANSETRON HCL 4 MG/2ML IJ SOLN
INTRAMUSCULAR | Status: DC | PRN
  Administered 2013-06-03: 4 mg via INTRAVENOUS

## 2013-06-03 MED ORDER — OXYCODONE HCL 5 MG/5ML PO SOLN: 5.0000 mg | Freq: Once | ORAL | Status: DC | PRN

## 2013-06-03 MED ORDER — MIDAZOLAM HCL 2 MG/2ML IJ SOLN
INTRAMUSCULAR | Status: AC
  Filled 2013-06-03: qty 2

## 2013-06-03 MED ORDER — FENTANYL CITRATE 0.05 MG/ML IJ SOLN
INTRAMUSCULAR | Status: AC
  Filled 2013-06-03: qty 2

## 2013-06-03 MED ORDER — LACTATED RINGERS IV SOLN
INTRAVENOUS | Status: DC
  Administered 2013-06-03 (×2): via INTRAVENOUS

## 2013-06-03 MED ORDER — CEFAZOLIN SODIUM-DEXTROSE 2-3 GM-% IV SOLR
2.0000 g | INTRAVENOUS | Status: AC
  Administered 2013-06-03: 2 g via INTRAVENOUS

## 2013-06-03 MED ORDER — SUCCINYLCHOLINE CHLORIDE 20 MG/ML IJ SOLN
INTRAMUSCULAR | Status: AC
  Filled 2013-06-03: qty 2

## 2013-06-03 MED ORDER — LABETALOL HCL 5 MG/ML IV SOLN
INTRAVENOUS | Status: AC
  Filled 2013-06-03: qty 4

## 2013-06-03 MED ORDER — INSULIN ASPART 100 UNIT/ML ~~LOC~~ SOLN
SUBCUTANEOUS | Status: AC
  Filled 2013-06-03: qty 1

## 2013-06-03 MED ORDER — OXYCODONE HCL 5 MG PO TABS: 5.0000 mg | ORAL_TABLET | Freq: Once | ORAL | Status: DC | PRN

## 2013-06-03 MED ORDER — CEFAZOLIN SODIUM-DEXTROSE 2-3 GM-% IV SOLR
INTRAVENOUS | Status: AC
  Filled 2013-06-03: qty 50

## 2013-06-03 MED ORDER — DEXAMETHASONE SODIUM PHOSPHATE 4 MG/ML IJ SOLN
INTRAMUSCULAR | Status: DC | PRN
  Administered 2013-06-03: 4 mg via INTRAVENOUS

## 2013-06-03 MED ORDER — LABETALOL HCL 5 MG/ML IV SOLN
10.0000 mg | INTRAVENOUS | Status: AC | PRN
  Administered 2013-06-03 (×2): 10 mg via INTRAVENOUS

## 2013-06-03 MED ORDER — BUPIVACAINE-EPINEPHRINE PF 0.5-1:200000 % IJ SOLN
INTRAMUSCULAR | Status: DC | PRN
  Administered 2013-06-03: 25 mL

## 2013-06-03 MED ORDER — HYDROMORPHONE HCL PF 1 MG/ML IJ SOLN: 0.2500 mg | INTRAMUSCULAR | Status: DC | PRN

## 2013-06-03 SURGICAL SUPPLY — 76 items
ANCH SUT SWLK 19.1 CLS EYLT TL (Anchor) ×1 IMPLANT
ANCHOR SUT BIO SW 4.75 W/FIB (Anchor) ×2 IMPLANT
APL SKNCLS STERI-STRIP NONHPOA (GAUZE/BANDAGES/DRESSINGS) ×1
BENZOIN TINCTURE PRP APPL 2/3 (GAUZE/BANDAGES/DRESSINGS) ×3 IMPLANT
BLADE CUTTER GATOR 3.5 (BLADE) ×3 IMPLANT
BLADE GREAT WHITE 4.2 (BLADE) IMPLANT
BLADE GREAT WHITE 4.2MM (BLADE)
BLADE SURG 15 STRL LF DISP TIS (BLADE) IMPLANT
BLADE SURG 15 STRL SS (BLADE) ×3
BUR OVAL 4.0 (BURR) IMPLANT
BUR OVAL 6.0 (BURR) ×2 IMPLANT
CANNULA 5.75X71 LONG (CANNULA) ×3 IMPLANT
CANNULA TWIST IN 8.25X7CM (CANNULA) ×4 IMPLANT
CHLORAPREP W/TINT 26ML (MISCELLANEOUS) ×2 IMPLANT
CLOSURE WOUND 1/2 X4 (GAUZE/BANDAGES/DRESSINGS) ×1
DECANTER SPIKE VIAL GLASS SM (MISCELLANEOUS) IMPLANT
DRAPE INCISE 23X17 IOBAN STRL (DRAPES) ×2
DRAPE INCISE 23X17 STRL (DRAPES) IMPLANT
DRAPE INCISE IOBAN 23X17 STRL (DRAPES) ×1 IMPLANT
DRAPE INCISE IOBAN 66X45 STRL (DRAPES) ×1 IMPLANT
DRAPE SHOULDER BEACH CHAIR (DRAPES) ×3 IMPLANT
DRAPE U 20/CS (DRAPES) ×3 IMPLANT
DRAPE U-SHAPE 47X51 STRL (DRAPES) ×3 IMPLANT
DRSG PAD ABDOMINAL 8X10 ST (GAUZE/BANDAGES/DRESSINGS) ×3 IMPLANT
DURAPREP 26ML APPLICATOR (WOUND CARE) ×1 IMPLANT
ELECT REM PT RETURN 9FT ADLT (ELECTROSURGICAL) ×3
ELECTRODE REM PT RTRN 9FT ADLT (ELECTROSURGICAL) ×1 IMPLANT
FIBERSTICK 2 (SUTURE) IMPLANT
GLOVE BIO SURGEON STRL SZ7.5 (GLOVE) ×4 IMPLANT
GLOVE BIO SURGEON STRL SZ8 (GLOVE) ×5 IMPLANT
GLOVE BIOGEL PI IND STRL 6.5 (GLOVE) IMPLANT
GLOVE BIOGEL PI IND STRL 7.0 (GLOVE) IMPLANT
GLOVE BIOGEL PI IND STRL 8 (GLOVE) ×2 IMPLANT
GLOVE BIOGEL PI INDICATOR 6.5 (GLOVE) ×2
GLOVE BIOGEL PI INDICATOR 7.0 (GLOVE) ×2
GLOVE BIOGEL PI INDICATOR 8 (GLOVE) ×6
GLOVE ECLIPSE 6.5 STRL STRAW (GLOVE) ×6 IMPLANT
GLOVE ORTHO TXT STRL SZ7.5 (GLOVE) ×3 IMPLANT
GOWN STRL REUS W/ TWL LRG LVL3 (GOWN DISPOSABLE) ×1 IMPLANT
GOWN STRL REUS W/ TWL XL LVL3 (GOWN DISPOSABLE) ×2 IMPLANT
GOWN STRL REUS W/TWL LRG LVL3 (GOWN DISPOSABLE) ×3
GOWN STRL REUS W/TWL XL LVL3 (GOWN DISPOSABLE) ×6
IMMOBILIZER SHOULDER FOAM XLGE (SOFTGOODS) IMPLANT
KIT SHOULDER TRACTION (DRAPES) ×3 IMPLANT
MANIFOLD NEPTUNE II (INSTRUMENTS) ×3 IMPLANT
NDL SCORPION MULTI FIRE (NEEDLE) IMPLANT
NEEDLE SCORPION MULTI FIRE (NEEDLE) ×3 IMPLANT
PACK ARTHROSCOPY DSU (CUSTOM PROCEDURE TRAY) ×3 IMPLANT
PACK BASIN DAY SURGERY FS (CUSTOM PROCEDURE TRAY) ×3 IMPLANT
SET ARTHROSCOPY TUBING (MISCELLANEOUS) ×3
SET ARTHROSCOPY TUBING LN (MISCELLANEOUS) ×1 IMPLANT
SHEET MEDIUM DRAPE 40X70 STRL (DRAPES) ×3 IMPLANT
SLEEVE SCD COMPRESS KNEE MED (MISCELLANEOUS) ×3 IMPLANT
SLING ARM IMMOBILIZER LRG (SOFTGOODS) ×2 IMPLANT
SLING ARM IMMOBILIZER MED (SOFTGOODS) IMPLANT
SLING ARM LRG ADULT FOAM STRAP (SOFTGOODS) IMPLANT
SLING ARM MED ADULT FOAM STRAP (SOFTGOODS) IMPLANT
SLING ARM XL FOAM STRAP (SOFTGOODS) IMPLANT
SPONGE GAUZE 4X4 12PLY (GAUZE/BANDAGES/DRESSINGS) ×3 IMPLANT
SPONGE GAUZE 4X4 12PLY STER LF (GAUZE/BANDAGES/DRESSINGS) ×2 IMPLANT
STRIP CLOSURE SKIN 1/2X4 (GAUZE/BANDAGES/DRESSINGS) ×2 IMPLANT
SUT FIBERWIRE #2 38 T-5 BLUE (SUTURE) ×3
SUT MNCRL AB 4-0 PS2 18 (SUTURE) ×2 IMPLANT
SUT PDS AB 1 CT  36 (SUTURE)
SUT PDS AB 1 CT 36 (SUTURE) IMPLANT
SUT TIGER TAPE 7 IN WHITE (SUTURE) IMPLANT
SUT VIC AB 3-0 SH 27 (SUTURE)
SUT VIC AB 3-0 SH 27X BRD (SUTURE) IMPLANT
SUTURE FIBERWR #2 38 T-5 BLUE (SUTURE) IMPLANT
TAPE FIBER 2MM 7IN #2 BLUE (SUTURE) ×1 IMPLANT
TOWEL OR 17X24 6PK STRL BLUE (TOWEL DISPOSABLE) ×3 IMPLANT
TOWEL OR NON WOVEN STRL DISP B (DISPOSABLE) ×3 IMPLANT
TUBE CONNECTING 20'X1/4 (TUBING)
TUBE CONNECTING 20X1/4 (TUBING) IMPLANT
WAND STAR VAC 90 (SURGICAL WAND) ×3 IMPLANT
WATER STERILE IRR 1000ML POUR (IV SOLUTION) ×3 IMPLANT

## 2013-06-03 NOTE — Progress Notes (Signed)
8u Novolog insulin given SQ LUA for CBG 340 per order Dr Gypsy BalsamKasik.

## 2013-06-03 NOTE — Transfer of Care (Signed)
Immediate Anesthesia Transfer of Care Note  Patient: Virginia [name]  Procedure(s) Performed: Procedure(s) with comments: LEFT SHOULDER ARTHROSCOPY DEBRIDEMENT EXTENTSIVE/SUBACROMIAL DECOMPRESSION/PARTIAL ACROMIOPLASTY DISTAL CLAVICLE RESECTION WITH CORACOACROMIAL RELEASE/ ROTATOR CUFF REPAIR  (Left) - ANESTHESIA: GENERAL, PRE/POST OP SCALENE  Patient Location: PACU  Anesthesia Type:GA combined with regional for post-op pain  Level of Consciousness: sedated  Airway & Oxygen Therapy: Patient Spontanous Breathing and Patient connected to face mask oxygen  Post-op Assessment: Report given to PACU RN and Post -op Vital signs reviewed and stable  Post vital signs: Reviewed and stable  Complications: No apparent anesthesia complications

## 2013-06-03 NOTE — Anesthesia Procedure Notes (Addendum)
Anesthesia Regional Block:  Interscalene brachial plexus block  Pre-Anesthetic Checklist: ,, timeout performed, Correct Patient, Correct Site, Correct Laterality, Correct Procedure, Correct Position, site marked, Risks and benefits discussed,  Surgical consent,  Pre-op evaluation,  At surgeon's request and post-op pain management  Laterality: Left  Prep: chloraprep       Needles:  Injection technique: Single-shot  Needle Type: Echogenic Stimulator Needle     Needle Length: 5cm 5 cm Needle Gauge: 22 and 22 G    Additional Needles:  Procedures: ultrasound guided (picture in chart) and nerve stimulator Interscalene brachial plexus block  Nerve Stimulator or Paresthesia:  Response: 0.5 mA,   Additional Responses:   Narrative:  Start time: 06/03/2013 6:50 AM End time: 06/03/2013 7:01 AM Injection made incrementally with aspirations every 5 mL. Anesthesiologist: Dr Gypsy BalsamKasik  Additional Notes: [phone] L ISB POP CHG prep, sterile tech #22 stim/echo needle with good US visualization-PIX in chart-and stim down to .5ma Multiple neg asp Vernia BuffMarc .5% w/epi 1:200000 total 25cc+decadron 4mg  infiltrated No compl Dr Gypsy BalsamKasik   Procedure Name: Intubation Date/Time: 06/03/2013 8:42 AM Performed by: Burna CashONRAD, Ival Pacer C Pre-anesthesia Checklist: Patient identified, Emergency Drugs available, Suction available and Patient being monitored Patient Re-evaluated:Patient Re-evaluated prior to inductionOxygen Delivery Method: Circle System Utilized Preoxygenation: Pre-oxygenation with 100% oxygen Intubation Type: IV induction Ventilation: Mask ventilation without difficulty Laryngoscope Size: Mac and 3 Grade View: Grade I Tube type: Oral Number of attempts: 1 Airway Equipment and Method: stylet and oral airway Placement Confirmation: ETT inserted through vocal cords under direct vision,  positive ETCO2 and breath sounds checked- equal and bilateral Secured at: 22 cm Tube secured with: Tape Dental  Injury: Teeth and Oropharynx as per pre-operative assessment

## 2013-06-03 NOTE — Op Note (Signed)
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 joint arthritis  POST-OPERATIVE DIAGNOSIS:  Same  PROCEDURE:  Shoulder arthroscopy with limited debridement of the labrum and the supraspinatus as well as the subacromial bursa with rotator cuff repair, acromioplasty, and distal clavicle resection  SURGEON:  Eulas PostLANDAU,Deane Melick P, MD  PHYSICIAN ASSISTANT: Janace LittenBrandon Parry, OPA-C, present and scrubbed throughout the case, critical for completion in a timely fashion, and for retraction, instrumentation, and closure.  ANESTHESIA:   General  PREOPERATIVE INDICATIONS:  [name] is a  43 y.o. female with a diagnosis of LEFT SHOULDER DISORDER ARTICULAR CARTILAGE/IMPINGEMENT SYNDROME-SHOULDER/COMPLETE RUPTURE OF ROTATOR CUFF who failed conservative measures and elected for surgical management.    The risks benefits and alternatives were discussed with the patient preoperatively including but not limited to the risks of infection, bleeding, nerve injury, cardiopulmonary complications, the need for revision surgery, among others, and the patient was willing to proceed.  OPERATIVE IMPLANTS: Arthrex bio composite 4.75 mm swivel lock x1 with a posterior inverted fiber tape, and an anterior inverted FiberWire.  OPERATIVE FINDINGS: There was a full-thickness supraspinatus tear, with a delaminated portion, and a v split off of the midportion of the tendon, and tendon quality was mediocre. There was subacromial spurring anteriorly against a.c. joint arthrosis.  OPERATIVE PROCEDURE: The patient is brought to the operating room and placed in supine position. General anesthesia was administered. She was turned into the semilateral decubitus position all bony prominences padded. The left upper extremity was examined and had full motion. It was then prepped and draped in usual sterile fashion a timeout performed.  Diagnostic arthroscopy was carried out the above-named findings. I used the arthroscopic shaver to debride a small area of the labrum, and then went to the subacromial space. I also debrided the undersurface torn per portion of the capsule and rotator cuff. The tear was cleaned, the tuberosity prepared with a bur gently, and the CA ligament released and acromioplasty performed.  I then used a bird beak suture passer to pass the fiber tape posteriorly, followed by a scorpion suture passer to place the second limb of the posterior suture, and then use the scorpion for the anterior FiberWire. The tendon had reasonably good mobility. The bone quality was good. I did this into a single anchor laterally, and had excellent fixation with no dogear.  I then resected the distal clavicle using 6-0 bur. Complete resection was confirmed on multiple views.  The shoulder was drained, repaired with Monocryl at the portal sites, Steri-Strips and sterile gauze. She was awakened and returned back in stable and satisfactory condition. There were no complications and she tolerated the procedure well.

## 2013-06-03 NOTE — Anesthesia Postprocedure Evaluation (Signed)
  Anesthesia Post-op Note  Patient: Virginia [name]  Procedure(s) Performed: Procedure(s) with comments: LEFT SHOULDER ARTHROSCOPY DEBRIDEMENT EXTENTSIVE/SUBACROMIAL DECOMPRESSION/PARTIAL ACROMIOPLASTY DISTAL CLAVICLE RESECTION WITH CORACOACROMIAL RELEASE/ ROTATOR CUFF REPAIR  (Left) - ANESTHESIA: GENERAL, PRE/POST OP SCALENE  Patient Location: PACU  Anesthesia Type:GA combined with regional for post-op pain  Level of Consciousness: awake and alert   Airway and Oxygen Therapy: Patient Spontanous Breathing  Post-op Pain: none  Post-op Assessment: Post-op Vital signs reviewed, Patient's Cardiovascular Status Stable, Respiratory Function Stable, Patent Airway, No signs of Nausea or vomiting and Pain level controlled  Post-op Vital Signs: Reviewed and stable  Complications: No apparent anesthesia complications

## 2013-06-03 NOTE — Progress Notes (Signed)
Assisted Dr. Kasik with left, ultrasound guided, interscalene  block. Side rails up, monitors on throughout procedure. See vital signs in flow sheet. Tolerated Procedure well.  

## 2013-06-03 NOTE — H&P (Signed)
PREOPERATIVE H&P  Chief Complaint: LEFT SHOULDER DISORDER ARTICULAR CARTILAGE/IMPINGEMENT SYNDROME-SHOULDER/COMPLETE RUPTURE OF ROTATOR CUFF  HPI: Virginia [name] is a 43 y.o. female who presents for preoperative history and physical with a diagnosis of LEFT SHOULDER DISORDER ARTICULAR CARTILAGE/IMPINGEMENT SYNDROME-SHOULDER/COMPLETE RUPTURE OF ROTATOR CUFF. Symptoms are rated as moderate to severe, and have been worsening.  This is significantly impairing activities of daily living.  She has elected for surgical management.   Past Medical History  Diagnosis Date  . Diabetes mellitus   . Obesity   . Wears glasses   . Hypertension     on HCTZ   Past Surgical History  Procedure Laterality Date  . Cholecystectomy    . Tubal ligation     History   Social History  . Marital Status: Married    Spouse Name: N/A    Number of Children: N/A  . Years of Education: N/A   Social History Main Topics  . Smoking status: Former Smoker    Quit date: 05/03/1998  . Smokeless tobacco: Never Used     Comment: Works at [employer] - AND does NOT smoke.   Marland Kitchen Alcohol Use: No     Comment: rare  . Drug Use: No  . Sexual Activity: None   Other Topics Concern  . None   Social History Narrative  . None   Family History  Problem Relation Age of Onset  . Diabetes Mother   . Heart disease Mother   . Hypertension Mother   . Hyperlipidemia Mother   . Cancer Mother   . Diabetes Father   . Heart disease Father   . Hypertension Father   . Hyperlipidemia Father    Allergies  Allergen Reactions  . Shrimp [Shellfish Allergy] Shortness Of Breath   Prior to Admission medications   Medication Sig Start Date End Date Taking? Authorizing Provider  atorvastatin (LIPITOR) 40 MG tablet Take 1 tablet (40 mg total) by mouth daily. 12/28/12  Yes Bryan R Hess, DO  glipiZIDE (GLIPIZIDE XL) 10 MG 24 hr tablet Take 1 tablet (10 mg total) by mouth daily. 05/04/13  Yes Bryan R Hess, DO  glucose blood test strip Use  as instructed.   Dispense one month supply in QS for twice daily testing. 10/21/12  Yes Sanjuana Letters, MD  hydrochlorothiazide (HYDRODIURIL) 25 MG tablet Take 1 tablet (25 mg total) by mouth daily. 12/28/12  Yes Bryan R Hess, DO  Insulin Glargine 100 UNIT/ML SOPN 22 units in the AM 09/28/12  Yes Bryan R Hess, DO  Insulin Pen Needle 31G X 5 MM MISC Use one needle per application 09/28/12  Yes Bryan R Hess, DO  Lancets (ONETOUCH ULTRASOFT) lancets Use to check blood sugar up to 3 times daily.  DG code 250.0 12/05/11  Yes Brent Bulla, MD  meloxicam (MOBIC) 7.5 MG tablet Take 1-2 tablets (7.5-15 mg total) by mouth daily. 04/25/13  Yes Ozella Rocks, MD  Multiple Vitamins-Minerals (MULTIVITAMIN WITH MINERALS) tablet Take 1 tablet by mouth daily.   Yes Historical Provider, MD     Positive ROS: All other systems have been reviewed and were otherwise negative with the exception of those mentioned in the HPI and as above.  Physical Exam: General: Alert, no acute distress Cardiovascular: No pedal edema Respiratory: No cyanosis, no use of accessory musculature GI: No organomegaly, abdomen is soft and non-tender Skin: No lesions in the area of chief complaint Neurologic: Sensation intact distally Psychiatric: Patient is competent for consent with normal mood and  affect Lymphatic: No axillary or cervical lymphadenopathy  MUSCULOSKELETAL: AROM 0-50 degrees, weak supraspinatus, ER 10 deg, min pain over ac joint.  Assessment: LEFT SHOULDER DISORDER ARTICULAR CARTILAGE/IMPINGEMENT SYNDROME-SHOULDER/COMPLETE RUPTURE OF ROTATOR CUFF  Plan: Plan for Procedure(s): LEFT SHOULDER ARTHROSCOPY DEBRIDEMENT EXTENTSIVE/SUBACROMIAL DECOMPRESSION/PARTIAL ACROMIOPLASTY WITH CORACOACROMIAL RELEASE/ ROTATOR CUFF REPAIR   The risks benefits and alternatives were discussed with the patient including but not limited to the risks of nonoperative treatment, versus surgical intervention including infection, bleeding,  nerve injury,  blood clots, cardiopulmonary complications, morbidity, mortality, among others, and they were willing to proceed.   Eulas PostLANDAU,[name], MD Cell [phone]   06/03/2013 7:32 AM

## 2013-06-03 NOTE — Anesthesia Preprocedure Evaluation (Addendum)
Anesthesia Evaluation  Patient identified by MRN, date of birth, ID band Patient awake, Patient confused and Patient unresponsive    Reviewed: Allergy & Precautions, H&P , NPO status , Patient's Chart, lab work & pertinent test results  Airway Mallampati: II TM Distance: >3 FB Neck ROM: Full    Dental   Pulmonary former smoker,  breath sounds clear to auscultation        Cardiovascular hypertension, Rhythm:Regular Rate:Normal     Neuro/Psych    GI/Hepatic   Endo/Other  diabetesMorbid obesity  Renal/GU      Musculoskeletal   Abdominal (+) + obese,   Peds  Hematology   Anesthesia Other Findings   Reproductive/Obstetrics                         Anesthesia Physical Anesthesia Plan  ASA: III  Anesthesia Plan: General   Post-op Pain Management:    Induction: Intravenous  Airway Management Planned: Oral ETT  Additional Equipment:   Intra-op Plan:   Post-operative Plan: Extubation in OR  Informed Consent: I have reviewed the patients History and Physical, chart, labs and discussed the procedure including the risks, benefits and alternatives for the proposed anesthesia with the patient or authorized representative who has indicated his/her understanding and acceptance.   Dental advisory given  Plan Discussed with: CRNA and Surgeon  Anesthesia Plan Comments:        Anesthesia Quick Evaluation

## 2013-06-03 NOTE — Discharge Instructions (Signed)
Diet: As you were doing prior to hospitalization   Shower:  May shower but keep the wounds dry, use an occlusive plastic wrap, NO SOAKING IN TUB.  If the bandage gets wet, change with a clean dry gauze.  Dressing:  You may change your dressing 3-5 days after surgery.  Then change the dressing daily with sterile gauze dressing.    There are sticky tapes (steri-strips) on your wounds and all the stitches are absorbable.  Leave the steri-strips in place when changing your dressings, they will peel off with time, usually 2-3 weeks.  Activity:  Increase activity slowly as tolerated, but follow the weight bearing instructions below.  No lifting or driving for 6 weeks.  Weight Bearing:   Sling at all time, no lifting with left arm..    To prevent constipation: you may use a stool softener such as -  Colace (over the counter) 100 mg by mouth twice a day  Drink plenty of fluids (prune juice may be helpful) and high fiber foods Miralax (over the counter) for constipation as needed.    Itching:  If you experience itching with your medications, try taking only a single pain pill, or even half a pain pill at a time.  You may take up to 10 pain pills per day, and you can also use benadryl over the counter for itching or also to help with sleep.   Precautions:  If you experience chest pain or shortness of breath - call 911 immediately for transfer to the hospital emergency department!!  If you develop a fever greater that 101 F, purulent drainage from wound, increased redness or drainage from wound, or calf pain -- Call the office at [phone]                                                Follow- Up Appointment:  Please call for an appointment to be seen in 2 weeks [place] - [phone]  Regional Anesthesia Blocks  1. Numbness or the inability to move the "blocked" extremity may last from 3-48 hours after placement. The length of time depends on the medication injected and your individual  response to the medication. If the numbness is not going away after 48 hours, call your surgeon.  2. The extremity that is blocked will need to be protected until the numbness is gone and the  Strength has returned. Because you cannot feel it, you will need to take extra care to avoid injury. Because it may be weak, you may have difficulty moving it or using it. You may not know what position it is in without looking at it while the block is in effect.  3. For blocks in the legs and feet, returning to weight bearing and walking needs to be done carefully. You will need to wait until the numbness is entirely gone and the strength has returned. You should be able to move your leg and foot normally before you try and bear weight or walk. You will need someone to be with you when you first try to ensure you do not fall and possibly risk injury.  4. Bruising and tenderness at the needle site are common side effects and will resolve in a few days.  5. Persistent numbness or new problems with movement should be communicated to the surgeon or the [facility] [phone]  [facility] [phone]).  Post Anesthesia Home Care Instructions  Activity: Get plenty of rest for the remainder of the day. A responsible adult should stay with you for 24 hours following the procedure.  For the next 24 hours, DO NOT: -Drive a car -Advertising copywriterperate machinery -Drink alcoholic beverages -Take any medication unless instructed by your physician -Make any legal decisions or sign important papers.  Meals: Start with liquid foods such as gelatin or soup. Progress to regular foods as tolerated. Avoid greasy, spicy, heavy foods. If nausea and/or vomiting occur, drink only clear liquids until the nausea and/or vomiting subsides. Call your physician if vomiting continues.  Special Instructions/Symptoms: Your throat may feel dry or sore from the anesthesia or the breathing tube placed in your throat  during surgery. If this causes discomfort, gargle with warm salt water. The discomfort should disappear within 24 hours.

## 2013-06-06 ENCOUNTER — Encounter (HOSPITAL_BASED_OUTPATIENT_CLINIC_OR_DEPARTMENT_OTHER): Payer: Self-pay | Admitting: Orthopedic Surgery

## 2013-06-13 ENCOUNTER — Other Ambulatory Visit: Payer: Self-pay | Admitting: *Deleted

## 2013-06-13 DIAGNOSIS — I10 Essential (primary) hypertension: Secondary | ICD-10-CM

## 2013-06-13 DIAGNOSIS — E785 Hyperlipidemia, unspecified: Secondary | ICD-10-CM

## 2013-06-14 MED ORDER — ATORVASTATIN CALCIUM 40 MG PO TABS: 40.0000 mg | ORAL_TABLET | Freq: Every day | ORAL | Status: DC

## 2013-06-14 MED ORDER — HYDROCHLOROTHIAZIDE 25 MG PO TABS: 25.0000 mg | ORAL_TABLET | Freq: Every day | ORAL | Status: DC

## 2013-09-28 ENCOUNTER — Telehealth: Payer: Self-pay | Admitting: Family Medicine

## 2013-09-28 NOTE — Telephone Encounter (Signed)
PCP is Dr Paulina FusiHess not [facility]

## 2013-10-21 ENCOUNTER — Ambulatory Visit (INDEPENDENT_AMBULATORY_CARE_PROVIDER_SITE_OTHER): Payer: 59 | Admitting: Family Medicine

## 2013-10-21 VITALS — BP 134/86 | HR 96 | Temp 98.1°F | Resp 16 | Ht 66.75 in | Wt 244.2 lb

## 2013-10-21 DIAGNOSIS — N764 Abscess of vulva: Secondary | ICD-10-CM

## 2013-10-21 DIAGNOSIS — IMO0001 Reserved for inherently not codable concepts without codable children: Secondary | ICD-10-CM

## 2013-10-21 DIAGNOSIS — IMO0002 Reserved for concepts with insufficient information to code with codable children: Secondary | ICD-10-CM

## 2013-10-21 DIAGNOSIS — E1165 Type 2 diabetes mellitus with hyperglycemia: Secondary | ICD-10-CM

## 2013-10-21 MED ORDER — DOXYCYCLINE HYCLATE 100 MG PO CAPS: 100.0000 mg | ORAL_CAPSULE | Freq: Two times a day (BID) | ORAL | Status: DC

## 2013-10-21 NOTE — Progress Notes (Signed)
   Subjective:    Patient ID: Virginia [name], female    DOB: 03/13/1971, 43 y.o.   MRN: [id number]  HPI Patient presents today with 5 day history of painful lesion on labia. Has gotten progressively bigger and more painful. Has take ibuprofen 3 tablets last night and this morning with some relief. She had a similar problem in 2013 that required I&D. She has been doing warm soaks. She has not noted any drainage.   Blood sugars running 180s- she reports this is good for her. [facility]   She had left rotator cuff surgery several months ago and just got released from PT last week. She is happy with her progress. She has returned to work this week.  Review of Systems No fever    Objective:   Physical Exam  Vitals reviewed. Constitutional: She is oriented to person, place, and time. She appears well-developed and well-nourished. No distress.  HENT:  Head: Normocephalic and atraumatic.  Eyes: Conjunctivae are normal.  Neck: Normal range of motion. Neck supple.  Cardiovascular: Normal rate.   Pulmonary/Chest: Effort normal.  Genitourinary:     Musculoskeletal: Normal range of motion.  Neurological: She is alert and oriented to person, place, and time.  Skin: Skin is dry. She is not diaphoretic.  Psychiatric: She has a normal mood and affect. Her behavior is normal. Judgment and thought content normal.   Procedure note: VCO. Area cleaned with alcohol. Lidocaine 1% with epi, 2 ml , used for local anesthesia. No.11 scalpel used to make incision. Large amount purulent drainage expressed. Packing and dressing applied. Patient reports improvement of pain.     Assessment & Plan:  1. Abscess of right genital labia - doxycycline (VIBRAMYCIN) 100 MG capsule; Take 1 capsule (100 mg total) by mouth 2 (two) times daily.  Dispense: 20 capsule; Refill: 0 - Wound culture -Instructions given to patient regarding warm compresses, dressing change in 24  hours -Fast pass provided for RTC in 2 days for wound care 2. DM (diabetes mellitus), type 2, uncontrolled -discussed possible complications of diabetes and importance of controlling her blood sugar and likely relationship between her abscess and her diabetes. -patient declined to have labs done today and assured me she would call today for an appointment for diabetes follow up. -encouraged weight loss and increased activity.  Emi Belfasteborah B. Abena Erdman, FNP-BC  [facility]  10/21/2013 1:21 PM

## 2013-10-21 NOTE — Patient Instructions (Signed)

## 2013-10-24 LAB — WOUND CULTURE
Gram Stain: NONE SEEN
Gram Stain: NONE SEEN

## 2013-12-22 ENCOUNTER — Ambulatory Visit (INDEPENDENT_AMBULATORY_CARE_PROVIDER_SITE_OTHER): Payer: 59 | Admitting: Family Medicine

## 2013-12-22 ENCOUNTER — Encounter: Payer: Self-pay | Admitting: Family Medicine

## 2013-12-22 VITALS — BP 164/78 | HR 85 | Temp 98.3°F | Resp 20 | Wt 232.0 lb

## 2013-12-22 DIAGNOSIS — Z23 Encounter for immunization: Secondary | ICD-10-CM

## 2013-12-22 DIAGNOSIS — E1165 Type 2 diabetes mellitus with hyperglycemia: Secondary | ICD-10-CM

## 2013-12-22 DIAGNOSIS — IMO0002 Reserved for concepts with insufficient information to code with codable children: Secondary | ICD-10-CM

## 2013-12-22 DIAGNOSIS — I10 Essential (primary) hypertension: Secondary | ICD-10-CM

## 2013-12-22 DIAGNOSIS — E785 Hyperlipidemia, unspecified: Secondary | ICD-10-CM

## 2013-12-22 LAB — POCT GLYCOSYLATED HEMOGLOBIN (HGB A1C): Hemoglobin A1C: 13.3

## 2013-12-22 MED ORDER — LOSARTAN POTASSIUM-HCTZ 100-25 MG PO TABS: 1.0000 | ORAL_TABLET | Freq: Every day | ORAL | Status: DC

## 2013-12-22 MED ORDER — ONETOUCH ULTRA SYSTEM W/DEVICE KIT: 1.0000 | PACK | Freq: Once | Status: DC

## 2013-12-22 MED ORDER — INSULIN GLARGINE 100 UNIT/ML SOLOSTAR PEN: 5.0000 [IU] | PEN_INJECTOR | Freq: Every morning | SUBCUTANEOUS | Status: DC

## 2013-12-22 MED ORDER — ROSUVASTATIN CALCIUM 20 MG PO TABS: 20.0000 mg | ORAL_TABLET | Freq: Every day | ORAL | Status: DC

## 2013-12-22 MED ORDER — GLIMEPIRIDE 4 MG PO TABS: 4.0000 mg | ORAL_TABLET | Freq: Every day | ORAL | Status: DC

## 2013-12-22 MED ORDER — INSULIN PEN NEEDLE 31G X 5 MM MISC: Status: DC

## 2013-12-22 NOTE — Addendum Note (Signed)
Addended by: Herminio HeadsHOLDER, Devonda Pequignot on: 12/22/2013 04:02 PM   Modules accepted: Orders

## 2013-12-22 NOTE — Assessment & Plan Note (Signed)
Lantus start at 5 U and increase by 2-3 U until < 150 x 2 days straight in AM.  Start Amaryl 4 mg daily.  Check Sugars in AM.  F/U in 4-6 weeks.  Referral to ophtho for retinal evaluation.

## 2013-12-22 NOTE — Assessment & Plan Note (Signed)
Start Crestor 20 mg daily, check CMET today.  F/U in 4-6 weeks

## 2013-12-22 NOTE — Assessment & Plan Note (Signed)
Start Hyzaar due to possible cough from lisinopril.  CMET today, f/u in 4-6 weeks to discuss results and BP.

## 2013-12-22 NOTE — Progress Notes (Signed)
Virginia [name] is a 43 y.o. female who presents today for HTN, DM II, Hyperlipidemia.  Hyperlipidemia - Previously on Lipitor, not taking currently due to lack of f/u.  Last lipid panel over 2 years ago.    DM II - Not taking Lantus currently or Glipizide due to lack of follow up.  Thinks her DM is out of control.    HTN - BP elevated again today to 164/78, states she is out of medications and hasn't been taking them for about 3 months due to lack of follow up.  Denies edema or cough, allergic reactions, palpitations, fatigue, HA, blurred vision.     Past Medical History  Diagnosis Date  . Diabetes mellitus   . Obesity   . Wears glasses   . Hypertension     on HCTZ    History  Smoking status  . Former Smoker  . Quit date: 05/03/1998  Smokeless tobacco  . Never Used    Comment: Works at [employer] - AND does NOT smoke.     Family History  Problem Relation Age of Onset  . Diabetes Mother   . Heart disease Mother   . Hypertension Mother   . Hyperlipidemia Mother   . Cancer Mother   . Diabetes Father   . Heart disease Father   . Hypertension Father   . Hyperlipidemia Father     Current Outpatient Prescriptions on File Prior to Visit  Medication Sig Dispense Refill  . atorvastatin (LIPITOR) 40 MG tablet Take 1 tablet (40 mg total) by mouth daily.  30 tablet  2  . glipiZIDE (GLIPIZIDE XL) 10 MG 24 hr tablet Take 1 tablet (10 mg total) by mouth daily.  30 tablet  11  . glucose blood test strip Use as instructed.   Dispense one month supply in QS for twice daily testing.  100 each  12  . hydrochlorothiazide (HYDRODIURIL) 25 MG tablet Take 1 tablet (25 mg total) by mouth daily.  30 tablet  2  . Insulin Glargine 100 UNIT/ML SOPN 22 units in the AM      . Insulin Pen Needle 31G X 5 MM MISC Use one needle per application  100 each  10  . Lancets (ONETOUCH ULTRASOFT) lancets Use to check blood sugar up to 3 times daily.  DG code 250.0  100 each  12  . Multiple Vitamins-Minerals  (MULTIVITAMIN WITH MINERALS) tablet Take 1 tablet by mouth daily.       No current facility-administered medications on file prior to visit.    ROS: Per HPI.  All other systems reviewed and are negative.   Physical Exam Filed Vitals:   12/22/13 1426  BP: 164/78  Pulse: 85  Temp: 98.3 F (36.8 C)  Resp: 20    Physical Examination: General appearance - overweight  Chest - CTA B/L  Heart - normal rate and regular rhythm, no murmurs appreciated  Neurological - no focal deficits  Skin: Small healing ulceration 1 x 1 cm, about 3 cm proximal to the medial malleolus on the L      Chemistry      Component Value Date/Time   NA 134* 05/31/2013 1200   K 4.2 05/31/2013 1200   CL 96 05/31/2013 1200   CO2 23 05/31/2013 1200   BUN 14 05/31/2013 1200   CREATININE 0.56 05/31/2013 1200   CREATININE 0.59 09/28/2012 1023      Component Value Date/Time   CALCIUM 9.4 05/31/2013 1200   ALKPHOS 136* 05/01/2010 2235  AST 12 05/01/2010 2235   ALT 12 05/01/2010 2235   BILITOT 0.3 05/01/2010 2235     Lab Results  Component Value Date   HGBA1C 9.1 12/28/2012

## 2013-12-22 NOTE — Patient Instructions (Signed)
Please start checking your sugars every morning.  Adjust your Lantus by 2 U (increase by 2) if your sugars are > 150.  Once you are under 150 for two consecutive days, please stay at that number.  Follow up with us in 4-5 weeks.  Thanks, Dr. Paulina FusiHess

## 2013-12-23 LAB — COMPREHENSIVE METABOLIC PANEL
ALT: 10 U/L (ref 0–35)
AST: 12 U/L (ref 0–37)
Albumin: 3.8 g/dL (ref 3.5–5.2)
Alkaline Phosphatase: 121 U/L — ABNORMAL HIGH (ref 39–117)
BILIRUBIN TOTAL: 0.3 mg/dL (ref 0.2–1.2)
BUN: 10 mg/dL (ref 6–23)
CALCIUM: 9.3 mg/dL (ref 8.4–10.5)
CO2: 27 meq/L (ref 19–32)
CREATININE: 0.64 mg/dL (ref 0.50–1.10)
Chloride: 101 mEq/L (ref 96–112)
Glucose, Bld: 307 mg/dL — ABNORMAL HIGH (ref 70–99)
Potassium: 3.8 mEq/L (ref 3.5–5.3)
Sodium: 136 mEq/L (ref 135–145)
Total Protein: 7.5 g/dL (ref 6.0–8.3)

## 2013-12-23 LAB — LIPID PANEL
CHOL/HDL RATIO: 3.1 ratio
Cholesterol: 147 mg/dL (ref 0–200)
HDL: 47 mg/dL (ref >39)
LDL Cholesterol: 81 mg/dL (ref 0–99)
Triglycerides: 93 mg/dL (ref <150)
VLDL: 19 mg/dL (ref 0–40)

## 2014-07-27 ENCOUNTER — Ambulatory Visit: Payer: Self-pay | Admitting: Family Medicine

## 2015-01-31 ENCOUNTER — Other Ambulatory Visit: Payer: Self-pay | Admitting: *Deleted

## 2015-01-31 DIAGNOSIS — E1165 Type 2 diabetes mellitus with hyperglycemia: Secondary | ICD-10-CM

## 2015-01-31 DIAGNOSIS — E118 Type 2 diabetes mellitus with unspecified complications: Principal | ICD-10-CM

## 2015-02-01 NOTE — Telephone Encounter (Signed)
Patient has not followed up in clinic for over a year. Will need to schedule an appointment to get refills for diabetic medications. Thank you.

## 2015-02-12 ENCOUNTER — Ambulatory Visit (INDEPENDENT_AMBULATORY_CARE_PROVIDER_SITE_OTHER): Payer: Self-pay | Admitting: Family Medicine

## 2015-02-12 ENCOUNTER — Encounter: Payer: Self-pay | Admitting: Family Medicine

## 2015-02-12 VITALS — BP 184/93 | HR 101 | Temp 98.2°F | Ht 67.0 in | Wt 235.4 lb

## 2015-02-12 DIAGNOSIS — E785 Hyperlipidemia, unspecified: Secondary | ICD-10-CM

## 2015-02-12 DIAGNOSIS — IMO0001 Reserved for inherently not codable concepts without codable children: Secondary | ICD-10-CM

## 2015-02-12 DIAGNOSIS — E1165 Type 2 diabetes mellitus with hyperglycemia: Secondary | ICD-10-CM

## 2015-02-12 DIAGNOSIS — I10 Essential (primary) hypertension: Secondary | ICD-10-CM

## 2015-02-12 LAB — POCT HEMOGLOBIN: Hemoglobin: 13.3 g/dL (ref 12.2–16.2)

## 2015-02-12 MED ORDER — LOSARTAN POTASSIUM-HCTZ 100-25 MG PO TABS: 1.0000 | ORAL_TABLET | Freq: Every day | ORAL | Status: DC

## 2015-02-12 MED ORDER — INSULIN PEN NEEDLE 31G X 5 MM MISC: Status: DC

## 2015-02-12 MED ORDER — GLUCOSE BLOOD VI STRP: ORAL_STRIP | Status: DC

## 2015-02-12 MED ORDER — INSULIN GLARGINE 100 UNIT/ML SOLOSTAR PEN: 10.0000 [IU] | PEN_INJECTOR | Freq: Every morning | SUBCUTANEOUS | Status: DC

## 2015-02-12 MED ORDER — ONETOUCH ULTRASOFT LANCETS MISC: Status: DC

## 2015-02-12 MED ORDER — ROSUVASTATIN CALCIUM 20 MG PO TABS: 20.0000 mg | ORAL_TABLET | Freq: Every day | ORAL | Status: DC

## 2015-02-12 MED ORDER — ONETOUCH ULTRA SYSTEM W/DEVICE KIT: 1.0000 | PACK | Freq: Once | Status: DC

## 2015-02-12 NOTE — Patient Instructions (Addendum)
Thank you for coming in today, it was so nice to meet you! Today we discussed your diabetes and high blood pressure. I would like to restart your blood pressure and diabetes medications. I would also like to restart your cholesterol medication.   For your high blood pressure: - Will send over prescription for Hyzaar - Will need to check the pulses in your legs, please schedule a visit to the pharmacy clinic for an "ABI" test.   For your diabetes: - Will check your A1C today - Check your sugar in the morning, please write down your morning sugar levels so we can review them at next visit.  - Will send over prescription for Lantus. Let's start at 10 units a day. Increase your Lantus 1 unit a day until your morning sugars is between 110-130 two mornings in a row. Once your sugar reaches 110-130 two mornings in a row, stop increasing the Lantus.  - Will send over prescriptions for diabetes supplies - You will also need to see the eye doctor (opthamologist) sometime in the near future.  To schedule your diabetic eye exam, please call [facility] at [phone]. They are located at: [address] [place], [place] [address]  For your cholesterol: - Will order blood work, you can come in anytime in the morning before you eat breakfast. Just call the clinic the day before you plan to come in to get the lab work done.   I would like to see you in 3-6 weeks for a follow up visit.    If you have any questions or concerns, please do not hesitate to call the office at [phone].  Sincerely,  Anders Simmondshristina Gambino, MD

## 2015-02-12 NOTE — Assessment & Plan Note (Addendum)
Patient has not taken statin in over 5 months - Restart Crestor 20 mg daily - Obtain CMP and lipid panel - Follow up in 3-6 weeks

## 2015-02-12 NOTE — Assessment & Plan Note (Signed)
Likely uncontrolled due to no medications in the last 5 months. Has polyuria, polydipsia, numbness/tingling in toes. Diabetic foot exam normal today.  - Obtain Hgb A1C  - Restart Lantus. Will start at 10 units/day and increase by 1 unit/day until glucose between 110-130 two mornings in a row.  - Will not restart Amaryl - Patient will check glucose each morning - Patient will schedule opthalmology appointment for retinal exam - Dorsalis pedis pulses palpable but faint bilaterally, patient will schedule ABI with pharmacy clinic. - Follow up in 3-6 weeks

## 2015-02-12 NOTE — Progress Notes (Signed)
Subjective:    Patient ID: Virginia [name] , female   DOB: 10/31/1970 , [age] y.o..   MRN: [id number]  Chief complaint: high blood pressure  HPI  Virginia [name] is here for follow up. Patient notes that she has not been taking any medications for the last 5 months because she has been out of work and has no [employer] insurance. She is starting a new job next week where she will have health insurance.   Hypertension Patient here for follow-up of uncontrolled arterial hypertension. Patient went to [place] for a BP check on 11/14 where her systolic BP was apparently 208 which scared her. She is not exercising and is not adherent to a low-salt diet. She has not been taking her prescribed Hyzaar 100-25mg  daily for the last 5 months.  Patient denies: chest pain, chest pressure/discomfort, claudication, dyspnea, exertional chest pressure/discomfort, palpitations and syncope.Cardiovascular risk factors: diabetes mellitus, dyslipidemia, hypertension and obesity (BMI >= 30 kg/m2). Use of agents associated with hypertension: none. History of target organ damage: none. Does admit to a headache every night for the last few weeks. Headache is bilateral at the temples of head. Headache is relieved with Ibuprofen. She describes headache as 4/10 and "achy". no aura.   Diabetes Mellitus Type 2  Patient was previously taking Amaryl 4 mg daily and 20 units Lantus daily but she has not taken any medications in the last 5 months.  Diabetic diet compliance: noncompliant much of the time, home glucose monitoring: none, further diabetic ROS: no chest pain, dyspnea or TIA's, no unusual visual symptoms, has noted excessive thirstiness and frequent urination, has numbness/tingling in the feet for the last year. Admits polyuria, polydipsia, no polyphagia for the last couple months.   Hyperlipidemia Patient has not taken her Crestor 20 mg daily for the last 5 months. Last lipid panel was normal about 1 year ago. Denies chest pain on  exertion, right upper quadrant pain, muscle aches, or leg claudication.   Review of Systems: Per HPI. All other systems reviewed and are negative.  Past Medical History: Patient Active Problem List   Diagnosis Date Noted  . Complete rotator cuff tear of left shoulder 06/03/2013  . Rotator cuff tendinitis 03/25/2013  . Diabetic neuropathy (HCC) 09/28/2012  . CERVICAL POLYP 02/14/2010  . Hyperlipidemia 03/06/2008  . DM (diabetes mellitus), type 2, uncontrolled (HCC) 05/14/2006  . OBESITY, NOS 05/14/2006  . HYPERTENSION, BENIGN SYSTEMIC 05/14/2006   Medications: reviewed and updated  Social History:   reports that she quit smoking about 16 years ago. She has never used smokeless tobacco.    Objective:   BP 184/93 mmHg  Pulse 101  Temp(Src) 98.2 F (36.8 C) (Oral)  Ht 5\' 7"  (1.702 m)  Wt 106.777 kg (235 lb 6.4 oz)  BMI 36.86 kg/m2  LMP 01/29/2015 (Approximate) Physical Exam  Gen: NAD, alert, cooperative with exam, well-appearing, obese HEENT: NCAT, PERRL, clear conjunctiva, oropharynx clear, supple neck CV: Regular rate and rhythm, normal S1/S2, no murmur, no edema, capillary refill brisk. Dorsalis pedis pulses palpable but faint bilaterally  Resp: CTABL, no wheezes, non-labored Skin: no rashes, normal turgor  Neuro: no gross deficits.  Psych: good insight, alert and oriented  Diabetic Foot Exam - Simple   Simple Foot Form  Diabetic Foot exam was performed with the following findings:  Yes 02/12/2015  3:49 PM  Visual Inspection  No deformities, no ulcerations, no other skin breakdown bilaterally:  Yes  Sensation Testing  Intact to touch and monofilament  testing bilaterally:  Yes  Pulse Check  Posterior Tibialis and Dorsalis pulse intact bilaterally:  Yes  Comments  Dorsalis pedis pulses are palpable but faint bilaterally      Assessment & Plan:  HYPERTENSION, BENIGN SYSTEMIC Uncontrolled. BP 184/93 today. No medications in the last 5 months due to no health  insurance.  - Restart Hyzaar 100-25 mg daily - Check CMP - Will follow up in 3-6 weeks  DM (diabetes mellitus), type 2, uncontrolled Likely uncontrolled due to no medications in the last 5 months. Has polyuria, polydipsia, numbness/tingling in toes. Diabetic foot exam normal today.  - Obtain Hgb A1C  - Restart Lantus. Will start at 10 units/day and increase by 1 unit/day until glucose between 110-130 two mornings in a row.  - Will not restart Amaryl - Patient will check glucose each morning - Patient will schedule opthalmology appointment for retinal exam - Dorsalis pedis pulses palpable but faint bilaterally, patient will schedule ABI with pharmacy clinic. - Follow up in 3-6 weeks  Hyperlipidemia Patient has not taken statin in over 5 months - Restart Crestor 20 mg daily - Obtain CMP and lipid panel - Follow up in 3-6 weeks

## 2015-02-12 NOTE — Assessment & Plan Note (Signed)
Uncontrolled. BP 184/93 today. No medications in the last 5 months due to no health insurance.  - Restart Hyzaar 100-25 mg daily - Check CMP - Will follow up in 3-6 weeks

## 2015-02-13 ENCOUNTER — Telehealth: Payer: Self-pay | Admitting: Family Medicine

## 2015-02-13 ENCOUNTER — Encounter: Payer: Self-pay | Admitting: Family Medicine

## 2015-02-13 MED ORDER — METFORMIN HCL 500 MG PO TABS: 500.0000 mg | ORAL_TABLET | Freq: Two times a day (BID) | ORAL | Status: DC

## 2015-02-13 MED ORDER — HYDROCHLOROTHIAZIDE 25 MG PO TABS: 25.0000 mg | ORAL_TABLET | Freq: Every day | ORAL | Status: DC

## 2015-02-13 NOTE — Telephone Encounter (Signed)
Called patient and her pharmacy. Medications are too expensive for her right now with no health insurance. She will be getting health insurance over the next couple weeks when she starts her new job. For now, will send patient prescriptions for HCTZ 25 mg daily and Metformin 500 mg BID to [facility]. Then once patient has health insurance, will stop HCTZ and Metformin and start previously prescribed meds.

## 2015-02-13 NOTE — Telephone Encounter (Signed)
Has no insurance and cannot afford her medicine. Could she get a generic?  Please advise

## 2015-02-16 ENCOUNTER — Other Ambulatory Visit (INDEPENDENT_AMBULATORY_CARE_PROVIDER_SITE_OTHER): Payer: Self-pay

## 2015-02-16 ENCOUNTER — Encounter: Payer: Self-pay | Admitting: Student

## 2015-02-16 ENCOUNTER — Telehealth: Payer: Self-pay | Admitting: Student

## 2015-02-16 DIAGNOSIS — E1165 Type 2 diabetes mellitus with hyperglycemia: Secondary | ICD-10-CM

## 2015-02-16 DIAGNOSIS — E785 Hyperlipidemia, unspecified: Secondary | ICD-10-CM

## 2015-02-16 DIAGNOSIS — I10 Essential (primary) hypertension: Secondary | ICD-10-CM

## 2015-02-16 DIAGNOSIS — IMO0001 Reserved for inherently not codable concepts without codable children: Secondary | ICD-10-CM

## 2015-02-16 LAB — COMPREHENSIVE METABOLIC PANEL
ALK PHOS: 144 U/L — AB (ref 33–115)
ALT: 9 U/L (ref 6–29)
AST: 9 U/L — AB (ref 10–35)
Albumin: 3.9 g/dL (ref 3.6–5.1)
BILIRUBIN TOTAL: 0.4 mg/dL (ref 0.2–1.2)
BUN: 11 mg/dL (ref 7–25)
CO2: 28 mmol/L (ref 20–31)
Calcium: 9.5 mg/dL (ref 8.6–10.2)
Chloride: 94 mmol/L — ABNORMAL LOW (ref 98–110)
Creat: 0.74 mg/dL (ref 0.50–1.10)
GLUCOSE: 387 mg/dL — AB (ref 65–99)
Potassium: 4 mmol/L (ref 3.5–5.3)
Sodium: 135 mmol/L (ref 135–146)
Total Protein: 8 g/dL (ref 6.1–8.1)

## 2015-02-16 LAB — LIPID PANEL
Cholesterol: 164 mg/dL (ref 125–200)
HDL: 38 mg/dL — ABNORMAL LOW (ref >=46)
LDL CALC: 101 mg/dL (ref <130)
TRIGLYCERIDES: 126 mg/dL (ref <150)
Total CHOL/HDL Ratio: 4.3 Ratio (ref <=5.0)
VLDL: 25 mg/dL (ref <30)

## 2015-02-16 LAB — POCT GLYCOSYLATED HEMOGLOBIN (HGB A1C): HEMOGLOBIN A1C: 14.5

## 2015-02-16 NOTE — Telephone Encounter (Signed)
Called and talked to patient about her A1c, which is 14.5. She said she hadn't taken her medications (insulin) for over 5 months. She just got her insurance working three days ago. Now she has all her medications including her insulin. I advised her to use insulin and metformin as directed by Dr. [name] during her recent visit. I also advised her to comeback in three months for follow up on her diabetes. Her Lipid panel is also not within desired range. However, she can discuss this with PCP when she comes back in three months. She voiced understanding and agreed to do so. She is grateful for call!

## 2015-02-16 NOTE — Progress Notes (Signed)
Labs done today Jezlyn Westerfield 

## 2015-02-18 ENCOUNTER — Encounter: Payer: Self-pay | Admitting: Student

## 2015-02-21 ENCOUNTER — Telehealth: Payer: Self-pay | Admitting: Family Medicine

## 2015-02-21 DIAGNOSIS — E119 Type 2 diabetes mellitus without complications: Secondary | ICD-10-CM

## 2015-02-21 MED ORDER — GLIPIZIDE 5 MG PO TABS: 5.0000 mg | ORAL_TABLET | Freq: Two times a day (BID) | ORAL | Status: DC

## 2015-02-21 NOTE — Telephone Encounter (Signed)
Called patient back. She states having diarrhea and vomiting with metformin. Asked her if she is using her insulin. She said she hasn't gotten her insulin because she don't have insurance. It got a little confusing because she said she has gotten her insurance working again when I talked to her on 02/16/2015 to discuss about her A1c. It seems like she is only on metformin right now, which she is not tolerating well. I advised her to take one tablet a day with meal and increase to twice daily after a week or two based on her tolerance. However, our call dropped before I discuss my further plan. I repeatedly called her back but she didn't pick her phone. I left a voice mail about the new plan with metformin, and adding glipizide 5 mg to be taken twice daily in addition to metformin.

## 2015-02-21 NOTE — Telephone Encounter (Signed)
Pt was started on metformin last week, says it is not agreeing with her stomach and doesn't think she can continue taking it, wants to know what to do?

## 2015-04-02 ENCOUNTER — Other Ambulatory Visit: Payer: Self-pay | Admitting: Family Medicine

## 2015-04-02 DIAGNOSIS — IMO0001 Reserved for inherently not codable concepts without codable children: Secondary | ICD-10-CM

## 2015-04-02 DIAGNOSIS — E1165 Type 2 diabetes mellitus with hyperglycemia: Principal | ICD-10-CM

## 2015-04-02 NOTE — Telephone Encounter (Signed)
Pt called because she now has insurance and would like the diabetic supplies all called back in. She could not pick them up because she didn't have insurance. She needs the glucometer, lancets, strips and anything else that goes with this. jw

## 2015-04-03 NOTE — Telephone Encounter (Signed)
°  ° °   Expand All Collapse All   Pt called because she now has insurance and would like the diabetic supplies all called back in. She could not pick them up because she didn't have insurance. She needs the glucometer, lancets, strips and anything else that goes with this. jw

## 2015-04-04 ENCOUNTER — Telehealth: Payer: Self-pay | Admitting: Family Medicine

## 2015-04-04 MED ORDER — ONETOUCH ULTRASOFT LANCETS MISC: Status: DC

## 2015-04-04 MED ORDER — ONETOUCH ULTRA SYSTEM W/DEVICE KIT: 1.0000 | PACK | Freq: Once | Status: DC

## 2015-04-04 MED ORDER — GLUCOSE BLOOD VI STRP: ORAL_STRIP | Status: DC

## 2015-04-04 NOTE — Telephone Encounter (Signed)
Pt checking on status of this request. Dorothey Baseman, ASA

## 2015-04-04 NOTE — Telephone Encounter (Signed)
Sent in prescription for diabetic supplies. Thank you.   Anders Simmonds, MD [facility], PGY-1

## 2015-04-04 NOTE — Telephone Encounter (Signed)
Pt informed. Sharon T Saunders, CMA  

## 2015-04-04 NOTE — Telephone Encounter (Signed)
Error

## 2015-05-21 ENCOUNTER — Other Ambulatory Visit: Payer: Self-pay | Admitting: Student

## 2015-05-21 NOTE — Telephone Encounter (Signed)
Refilled patient's prescription for 30 days. She will need to schedule an appointment at the clinic for diabetes follow up. Called patient and left voicemail saying I have refilled her prescriptions and that she will need to schedule an appointment.

## 2015-06-20 ENCOUNTER — Ambulatory Visit: Payer: Self-pay | Admitting: Family Medicine

## 2015-07-09 ENCOUNTER — Ambulatory Visit (INDEPENDENT_AMBULATORY_CARE_PROVIDER_SITE_OTHER): Payer: Commercial Managed Care - HMO | Admitting: Family Medicine

## 2015-07-09 ENCOUNTER — Encounter: Payer: Self-pay | Admitting: Family Medicine

## 2015-07-09 VITALS — BP 143/78 | HR 94 | Temp 98.3°F | Wt 231.7 lb

## 2015-07-09 DIAGNOSIS — I10 Essential (primary) hypertension: Secondary | ICD-10-CM | POA: Diagnosis not present

## 2015-07-09 DIAGNOSIS — E1165 Type 2 diabetes mellitus with hyperglycemia: Secondary | ICD-10-CM | POA: Diagnosis not present

## 2015-07-09 DIAGNOSIS — IMO0001 Reserved for inherently not codable concepts without codable children: Secondary | ICD-10-CM

## 2015-07-09 LAB — POCT GLYCOSYLATED HEMOGLOBIN (HGB A1C)

## 2015-07-09 MED ORDER — INSULIN PEN NEEDLE 31G X 5 MM MISC: Status: DC

## 2015-07-09 MED ORDER — METFORMIN HCL 500 MG PO TABS: 500.0000 mg | ORAL_TABLET | Freq: Two times a day (BID) | ORAL | Status: DC

## 2015-07-09 MED ORDER — INSULIN GLARGINE 100 UNIT/ML SOLOSTAR PEN: 15.0000 [IU] | PEN_INJECTOR | Freq: Every morning | SUBCUTANEOUS | Status: DC

## 2015-07-09 MED ORDER — ROSUVASTATIN CALCIUM 20 MG PO TABS: 20.0000 mg | ORAL_TABLET | Freq: Every day | ORAL | Status: DC

## 2015-07-09 MED ORDER — LOSARTAN POTASSIUM-HCTZ 100-25 MG PO TABS: 1.0000 | ORAL_TABLET | Freq: Every day | ORAL | Status: DC

## 2015-07-09 MED ORDER — ONETOUCH ULTRASOFT LANCETS MISC: Status: DC

## 2015-07-09 MED ORDER — GLUCOSE BLOOD VI STRP: ORAL_STRIP | Status: DC

## 2015-07-09 NOTE — Assessment & Plan Note (Addendum)
Mostly controlled. BP 143/78 (slightly elevated systolic today). Has been intermittently compliant with HCTZ since December 2016.  - Discontinue HCTZ and restart Hyzaar 100-25 mg now that patient has insurance - Encouraged patient to check BP at home - Follow up in 2 weeks to see if systolic BP has improved

## 2015-07-09 NOTE — Assessment & Plan Note (Addendum)
Uncontrolled. Hgb A1C >15 today. Likely due to suboptimal diabetes therapy secondary to poor patient compliance in addition to not being able to afford Lantus while she had no insurance. Continues to endorse polyuria, polydipsia, and numbness/tingling in toes.  - Patient has insurance now and is on board with resuming Lantus. Will start at 15 units daily for the next 2 weeks to avoid hypoglycemic symptoms. - Continue Metformin. - Stop Glipizide.  - Record AM fasting glucose and PM glucose daily. - All refills provided for diabetic supplies. - Encouraged low carbohydrate diet. - Follow up in 2 weeks to re-adjust Lantus if needed.

## 2015-07-09 NOTE — Progress Notes (Signed)
   Subjective:    Patient ID: Virginia [name] , female   DOB: 05/14/1970 , 45 y.o..   MRN: [id number]  HPI  Virginia [name] is here for follow up.  Hypertension Blood pressure at home: Does not check at home.  Blood pressure today: 143/78 Meds: Intermittently compliant with HCTZ 25 mg, takes daily.  Side effects: sometimes the medication makes her legs hurt. ROS: Denies headache, dizziness, visual changes, nausea, vomiting, chest pain, abdominal pain or shortness of breath.  Diabetes: Medications: Has not been taking metformin, due to diarrhea and GI upset. Has been taking Glipizide twice daily with meals.  Diet: Has been cutting back on sodas and trying flavored water instead.  Eyes: Last one was June 2016.  Kidneys: BMP WNL on 02/16/15 Feet: Diabetic foot exam WNL on 02/12/2015 Checking glucose?: Does not test glucose often.  Diabetic Review of Systems - further diabetic ROS: no chest pain, dyspnea or TIA's, no unusual visual symptoms, has noted excessive thirstiness and frequent urination as well as numbness/tingling in toes.   Review of Systems: Per HPI. All other systems reviewed and are negative.  Past Medical History: Patient Active Problem List   Diagnosis Date Noted  . Complete rotator cuff tear of left shoulder 06/03/2013  . Rotator cuff tendinitis 03/25/2013  . Diabetic neuropathy (HCC) 09/28/2012  . CERVICAL POLYP 02/14/2010  . Hyperlipidemia 03/06/2008  . DM (diabetes mellitus), type 2, uncontrolled (HCC) 05/14/2006  . OBESITY, NOS 05/14/2006  . HYPERTENSION, BENIGN SYSTEMIC 05/14/2006    Medications: reviewed and updated  Social Hx:  reports that she quit smoking about 17 years ago. She has never used smokeless tobacco.    Objective:   BP 143/78 mmHg  Pulse 94  Temp(Src) 98.3 F (36.8 C) (Oral)  Wt 231 lb 11.2 oz (105.098 kg) Physical Exam  Gen: NAD, alert, cooperative with exam, well-appearing, obese Cardiac: Regular rate and rhythm, normal S1/S2, no murmur,  no edema, capillary refill brisk  Respiratory: Clear to auscultation bilaterally, no wheezes, non-labored Neurological: no gross deficits. Sensation intake in upper and lower extremities Psych: good insight, normal mood and affect   Assessment & Plan:  DM (diabetes mellitus), type 2, uncontrolled (HCC) Uncontrolled. Hgb A1C >15 today. Likely due to suboptimal diabetes therapy secondary to poor patient compliance in addition to not being able to afford Lantus while she had no insurance. Continues to endorse polyuria, polydipsia, and numbness/tingling in toes.  - Patient has insurance now and is on board with resuming Lantus. Will start at 15 units daily for the next 2 weeks to avoid hypoglycemic symptoms. - Continue Metformin. - Stop Glipizide.  - Record AM fasting glucose and PM glucose daily. - All refills provided for diabetic supplies. - Encouraged low carbohydrate diet. - Follow up in 2 weeks to re-adjust Lantus if needed.   HYPERTENSION, BENIGN SYSTEMIC Mostly controlled. BP 143/78 (slightly elevated systolic today). Has been intermittently compliant with HCTZ since December 2016.  - Discontinue HCTZ and restart Hyzaar 100-25 mg now that patient has insurance - Encouraged patient to check BP at home - Follow up in 2 weeks to see if systolic BP has improved

## 2015-07-09 NOTE — Patient Instructions (Addendum)
Thank you for coming in today, it was so nice to see you!  Today we talked about diabetes, high blood pressure, and high cholesterol.   We will make a couple changes to your medication:  1. Stop Glipizide. Start 15 units of Lantus a day 2. Continue Metformin 3. Stop Hydrochlorothiazide. Restart Hyzaar.  4. Start Crestor  I will send these medications to your pharmacy.    Please check your sugar every morning and evening, write down these numbers and we will review it next time you come in.   I'd like to see you back in 2-3 weeks to make sure your sugar is ok. Please make an appointment at the front desk  before you leave.   If you have any questions or concerns, please do not hesitate to call the office at [phone].  Sincerely,  Anders Simmonds, MD   Diabetes Mellitus and Food It is important for you to manage your blood sugar (glucose) level. Your blood glucose level can be greatly affected by what you eat. Eating healthier foods in the appropriate amounts throughout the day at about the same time each day will help you control your blood glucose level. It can also help slow or prevent worsening of your diabetes mellitus. Healthy eating may even help you improve the level of your blood pressure and reach or maintain a healthy weight.  General recommendations for healthful eating and cooking habits include:  Eating meals and snacks regularly. Avoid going long periods of time without eating to lose weight.  Eating a diet that consists mainly of plant-based foods, such as fruits, vegetables, nuts, legumes, and whole grains.  Using low-heat cooking methods, such as baking, instead of high-heat cooking methods, such as deep frying. Work with your dietitian to make sure you understand how to use the Nutrition Facts information on food labels. HOW CAN FOOD AFFECT ME? Carbohydrates Carbohydrates affect your blood glucose level more than any other type of food. Your dietitian will  help you determine how many carbohydrates to eat at each meal and teach you how to count carbohydrates. Counting carbohydrates is important to keep your blood glucose at a healthy level, especially if you are using insulin or taking certain medicines for diabetes mellitus. Alcohol Alcohol can cause sudden decreases in blood glucose (hypoglycemia), especially if you use insulin or take certain medicines for diabetes mellitus. Hypoglycemia can be a life-threatening condition. Symptoms of hypoglycemia (sleepiness, dizziness, and disorientation) are similar to symptoms of having too much alcohol.  If your health care provider has given you approval to drink alcohol, do so in moderation and use the following guidelines:  Women should not have more than one drink per day, and men should not have more than two drinks per day. One drink is equal to:  12 oz of beer.  5 oz of wine.  1 oz of hard liquor.  Do not drink on an empty stomach.  Keep yourself hydrated. Have water, diet soda, or unsweetened iced tea.  Regular soda, juice, and other mixers might contain a lot of carbohydrates and should be counted. WHAT FOODS ARE NOT RECOMMENDED? As you make food choices, it is important to remember that all foods are not the same. Some foods have fewer nutrients per serving than other foods, even though they might have the same number of calories or carbohydrates. It is difficult to get your body what it needs when you eat foods with fewer nutrients. Examples of foods that you should avoid that  are high in calories and carbohydrates but low in nutrients include:  Trans fats (most processed foods list trans fats on the Nutrition Facts label).  Regular soda.  Juice.  Candy.  Sweets, such as cake, pie, doughnuts, and cookies.  Fried foods. WHAT FOODS CAN I EAT? Eat nutrient-rich foods, which will nourish your body and keep you healthy. The food you should eat also will depend on several factors,  including:  The calories you need.  The medicines you take.  Your weight.  Your blood glucose level.  Your blood pressure level.  Your cholesterol level. You should eat a variety of foods, including:  Protein.  Lean cuts of meat.  Proteins low in saturated fats, such as fish, egg whites, and beans. Avoid processed meats.  Fruits and vegetables.  Fruits and vegetables that may help control blood glucose levels, such as apples, mangoes, and yams.  Dairy products.  Choose fat-free or low-fat dairy products, such as milk, yogurt, and cheese.  Grains, bread, pasta, and rice.  Choose whole grain products, such as multigrain bread, whole oats, and brown rice. These foods may help control blood pressure.  Fats.  Foods containing healthful fats, such as nuts, avocado, olive oil, canola oil, and fish. DOES EVERYONE WITH DIABETES MELLITUS HAVE THE SAME MEAL PLAN? Because every person with diabetes mellitus is different, there is not one meal plan that works for everyone. It is very important that you meet with a dietitian who will help you create a meal plan that is just right for you.   This information is not intended to replace advice given to you by your health care provider. Make sure you discuss any questions you have with your health care provider.   Document Released: 11/28/2004 Document Revised: 03/24/2014 Document Reviewed: 01/28/2013 Elsevier Interactive Patient Education [employer]2016 [employer] Inc.

## 2015-07-10 ENCOUNTER — Telehealth: Payer: Self-pay | Admitting: *Deleted

## 2015-07-10 NOTE — Telephone Encounter (Signed)
[facility] [employer]. Called patient's insurance; how the pharmacy was running the claim was for 15 ML/ 100 days.  Per patient's insurance; 15 ML can be ran as a 30 day supply without a PA.  Pharmacy was called back and patient is able to pick up medications.   Clovis PuMartin, Tamika L, RN

## 2015-07-10 NOTE — Telephone Encounter (Signed)
Patient states pharmacy told her that her insulin would need a prior authorization before it can be filled. Patient wanted MD to authorize this through her insurance.

## 2016-01-22 ENCOUNTER — Other Ambulatory Visit: Payer: Self-pay | Admitting: Family Medicine

## 2016-01-22 DIAGNOSIS — I10 Essential (primary) hypertension: Secondary | ICD-10-CM

## 2016-02-18 ENCOUNTER — Ambulatory Visit (INDEPENDENT_AMBULATORY_CARE_PROVIDER_SITE_OTHER): Payer: Commercial Managed Care - HMO | Admitting: Family Medicine

## 2016-02-18 ENCOUNTER — Encounter: Payer: Self-pay | Admitting: Family Medicine

## 2016-02-18 ENCOUNTER — Telehealth: Payer: Self-pay | Admitting: *Deleted

## 2016-02-18 VITALS — BP 178/91 | HR 97 | Temp 98.6°F | Ht 67.0 in | Wt 234.4 lb

## 2016-02-18 DIAGNOSIS — I10 Essential (primary) hypertension: Secondary | ICD-10-CM | POA: Diagnosis not present

## 2016-02-18 DIAGNOSIS — E1165 Type 2 diabetes mellitus with hyperglycemia: Secondary | ICD-10-CM

## 2016-02-18 DIAGNOSIS — R200 Anesthesia of skin: Secondary | ICD-10-CM

## 2016-02-18 DIAGNOSIS — IMO0001 Reserved for inherently not codable concepts without codable children: Secondary | ICD-10-CM

## 2016-02-18 DIAGNOSIS — E1149 Type 2 diabetes mellitus with other diabetic neurological complication: Secondary | ICD-10-CM | POA: Diagnosis not present

## 2016-02-18 DIAGNOSIS — R202 Paresthesia of skin: Secondary | ICD-10-CM

## 2016-02-18 LAB — COMPLETE METABOLIC PANEL WITH GFR
ALT: 16 U/L (ref 6–29)
AST: 13 U/L (ref 10–35)
Albumin: 3.6 g/dL (ref 3.6–5.1)
Alkaline Phosphatase: 124 U/L — ABNORMAL HIGH (ref 33–115)
BUN: 16 mg/dL (ref 7–25)
CALCIUM: 9 mg/dL (ref 8.6–10.2)
CHLORIDE: 103 mmol/L (ref 98–110)
CO2: 26 mmol/L (ref 20–31)
CREATININE: 0.7 mg/dL (ref 0.50–1.10)
Glucose, Bld: 391 mg/dL — ABNORMAL HIGH (ref 65–99)
POTASSIUM: 3.9 mmol/L (ref 3.5–5.3)
Sodium: 136 mmol/L (ref 135–146)
Total Bilirubin: 0.2 mg/dL (ref 0.2–1.2)
Total Protein: 7 g/dL (ref 6.1–8.1)

## 2016-02-18 LAB — LIPID PANEL
CHOLESTEROL: 141 mg/dL (ref <200)
HDL: 40 mg/dL — AB (ref >50)
LDL CALC: 78 mg/dL (ref <100)
TRIGLYCERIDES: 116 mg/dL (ref <150)
Total CHOL/HDL Ratio: 3.5 Ratio (ref <5.0)
VLDL: 23 mg/dL (ref <30)

## 2016-02-18 LAB — CBC WITH DIFFERENTIAL/PLATELET
BASOS ABS: 66 {cells}/uL (ref 0–200)
BASOS PCT: 1 %
EOS ABS: 66 {cells}/uL (ref 15–500)
Eosinophils Relative: 1 %
HEMATOCRIT: 37.6 % (ref 35.0–45.0)
Hemoglobin: 11.9 g/dL (ref 11.7–15.5)
LYMPHS PCT: 39 %
Lymphs Abs: 2574 cells/uL (ref 850–3900)
MCH: 25.3 pg — AB (ref 27.0–33.0)
MCHC: 31.6 g/dL — AB (ref 32.0–36.0)
MCV: 80 fL (ref 80.0–100.0)
MONO ABS: 264 {cells}/uL (ref 200–950)
MONOS PCT: 4 %
MPV: 10.9 fL (ref 7.5–12.5)
NEUTROS PCT: 55 %
Neutro Abs: 3630 cells/uL (ref 1500–7800)
PLATELETS: 235 10*3/uL (ref 140–400)
RBC: 4.7 MIL/uL (ref 3.80–5.10)
RDW: 14 % (ref 11.0–15.0)
WBC: 6.6 10*3/uL (ref 3.8–10.8)

## 2016-02-18 LAB — POCT GLYCOSYLATED HEMOGLOBIN (HGB A1C): Hemoglobin A1C: 14

## 2016-02-18 MED ORDER — GABAPENTIN 300 MG PO CAPS: 300.0000 mg | ORAL_CAPSULE | Freq: Every day | ORAL | 3 refills | Status: DC

## 2016-02-18 MED ORDER — INSULIN GLARGINE 100 UNIT/ML SOLOSTAR PEN: 26.0000 [IU] | PEN_INJECTOR | Freq: Every morning | SUBCUTANEOUS | 5 refills | Status: DC

## 2016-02-18 NOTE — Assessment & Plan Note (Signed)
Uncontrolled. Hemoglobin A1c 14 today, slightly improved from last hemoglobin A1c of greater than 15. Patient has had poor diabetes medication compliance that she is very honest about. It has been difficult for her to have a consistent and healthy sleep, diet, and medication regimen as she works third shift. Patient seems aware that her diabetes is uncontrolled and she is willing to make some changes although she is hesitant to start any new medications at this time. Would not like to start any short acting insulin at this time.  -Increase Lantus to 26 units daily -We will take metformin off medication list as patient has not been taking and does not tolerate this due to GI symptoms -We will refer to Dr. Gerilyn PilgrimSykes for nutrition consult, patient is agreeable to this -Instructed patient to record her glucose at least once daily preferably fasting -Return to clinic in 2-3 weeks

## 2016-02-18 NOTE — Progress Notes (Signed)
Subjective:    Patient ID: Virginia [name] , female   DOB: 10/21/1970 , 45 y.o..   MRN: [id number]  HPI  Virginia [name] is here for follow up.  Chronic Diabetes  Disease Monitoring  Blood Sugar Ranges: doesn't check sugar every day, will only check is she "feels bad"- her last glucose check was a couple weeks ago and it was in the 400's.   Polyuria: yes   Visual problems: no   Last hemoglobin A1C:  Lab Results  Component Value Date   HGBA1C 14.0 02/18/2016    Medication Compliance: no, has not been taking Metformin because of diarrhea, vomiting, abdominal pain. Has been taking her insulin though, taking 22 units of Lantus about 3-4x a week.   Medication Side Effects  Hypoglycemia: no   Preventitive Health Care  Eye Exam: Due  Foot Exam: Up to date  Diet pattern: Is having trouble decreasing carbohydrates and sugars. She has transitioned to diet soda and crystal light instead of real sugar drinks.   Exercise: Has been walking around her neighborhood with her friends   Diabetic Neuropathy: Has been having this problem for the last 3 years, the numbness and tingling in her feet have been getting progressively worse.  Hypertension Blood pressure at home: does not check at home Exercise: see above Low salt diet: no Medications: Noncompliant, has not been taking regurlary, did not take take  Side effects: None ROS: Denies headache, dizziness, visual changes, nausea, vomiting, chest pain, abdominal pain or shortness of breath. BP Readings from Last 3 Encounters:  02/18/16 (!) 178/91  07/09/15 (!) 143/78  02/12/15 (!) 184/93    Review of Systems: Per HPI. All other systems reviewed and are negative.  Medications: reviewed and updated  Social Hx:  reports that she quit smoking about 17 years ago. She has never used smokeless tobacco.   Objective:   BP (!) 178/91   Pulse 97   Temp 98.6 F (37 C) (Oral)   Ht 5\' 7"  (1.702 m)   Wt 234 lb 6.4 oz (106.3 kg)   LMP 02/06/2016    BMI 36.71 kg/m  Physical Exam  Gen: NAD, alert, cooperative with exam, well-appearing HEENT: NCAT, PERRL, clear conjunctiva, oropharynx clear, supple neck Cardiac: Regular rate and rhythm, normal S1/S2, no murmur, no edema, capillary refill brisk  Respiratory: Clear to auscultation bilaterally, no wheezes, non-labored breathing Gastrointestinal: soft, non tender, non distended, bowel sounds present Neurological: no gross deficits.  Psych: good insight, normal mood and affect   Assessment & Plan:  Diabetic neuropathy Not currently on any medications for this. Diabetes is still uncontrolled with hemoglobin A1c of 14. -We will start low-dose gabapentin 300 mg daily at bedtime -Encouraged patient to gain better control her glucose, see diabetes assessment and plan -Refer to podiatry per patient request -Encouraged patient to check her feet daily for lesions  DM (diabetes mellitus), type 2, uncontrolled (HCC) Uncontrolled. Hemoglobin A1c 14 today, slightly improved from last hemoglobin A1c of greater than 15. Patient has had poor diabetes medication compliance that she is very honest about. It has been difficult for her to have a consistent and healthy sleep, diet, and medication regimen as she works third shift. Patient seems aware that her diabetes is uncontrolled and she is willing to make some changes although she is hesitant to start any new medications at this time. Would not like to start any short acting insulin at this time.  -Increase Lantus to 26 units daily -We will  take metformin off medication list as patient has not been taking and does not tolerate this due to GI symptoms -We will refer to Dr. Gerilyn PilgrimSykes for nutrition consult, patient is agreeable to this -Instructed patient to record her glucose at least once daily preferably fasting -Return to clinic in 2-3 weeks   HYPERTENSION, BENIGN SYSTEMIC Uncontrolled today blood pressure 178/91. Patient that she has not been taking her  antihypertensives. -Continue Hyzaar 100-25 milligrams daily -Follow-up in 2-3 weeks -Check CBC, CMP, lipid panel   Anders Simmondshristina Cesilia Shinn, MD [facility], PGY-2

## 2016-02-18 NOTE — Telephone Encounter (Signed)
Received fax from [facility] needing clarification on Lantus directions.  Rx sent in with 2 different directions: Inject 26 Units into the skin every morning and Inject 15 Units into the skin every morning.  Per office note from today 02/18/16, Lantus was increased to 26 Units into the skin every morning.  Rx resent with the correct directions.  Clovis PuMartin, Tamika L, RN

## 2016-02-18 NOTE — Assessment & Plan Note (Addendum)
Not currently on any medications for this. Diabetes is still uncontrolled with hemoglobin A1c of 14. -We will start low-dose gabapentin 300 mg daily at bedtime -Encouraged patient to gain better control her glucose, see diabetes assessment and plan -Refer to podiatry per patient request -Encouraged patient to check her feet daily for lesions

## 2016-02-18 NOTE — Patient Instructions (Addendum)
Thank you for coming in today, it was so nice to see you! Today we talked about:    Diabetes: Your diabetes is still not controlled. I will take Metformin off your medications as this medication does not agree with you. We will increase your insulin to 26 units daily. Please take this at the same time every day. I would also like you to check your sugar when you wake up every day. Please keep a log of what your sugar is and bring it to her next appointment in 2-3 weeks.  Diabetes nutrition: I think that you would greatly benefit from seeing a nutritionist.  Please call Dr Gerilyn PilgrimSykes at [phone] to schedule an appointment.  Blood pressure: Your blood pressure is high today. Please take your blood pressure medication every day. I recommend using a pillbox to keep your medications organized  Foot pain: I given a prescription for gabapentin. Please take this as prescribed. We will see how you're doing to 3 weeks. I also placed a referral to a podiatrist    Please follow up in 2-3 weeks. You can schedule this appointment at the front desk before you leave or call the clinic.  Bring in all your medications or supplements to each appointment for review.   If we ordered any tests today, you will be notified via telephone of any abnormalities. If everything is normal you will get a letter in the mail.   If you have any questions or concerns, please do not hesitate to call the office at [phone]. You can also message me directly via MyChart.   Sincerely,  Anders Simmondshristina Monic Engelmann, MD

## 2016-02-18 NOTE — Assessment & Plan Note (Signed)
Uncontrolled today blood pressure 178/91. Patient that she has not been taking her antihypertensives. -Continue Hyzaar 100-25 milligrams daily -Follow-up in 2-3 weeks -Check CBC, CMP, lipid panel

## 2016-02-19 ENCOUNTER — Encounter: Payer: Self-pay | Admitting: Family Medicine

## 2016-03-03 ENCOUNTER — Ambulatory Visit: Payer: Commercial Managed Care - HMO | Admitting: Podiatry

## 2016-03-06 ENCOUNTER — Ambulatory Visit: Payer: Commercial Managed Care - HMO | Admitting: Family Medicine

## 2016-03-06 NOTE — Progress Notes (Deleted)
   Subjective:    Patient ID: Virginia [name] , female   DOB: [birth year] , 45 y.o..   MRN: [id number]  HPI  Virginia [name] is here for ***.     Review of Systems: Per HPI. All other systems reviewed and are negative.  Health Maintenance Due  Topic Date Due  . OPHTHALMOLOGY EXAM  11/18/1979  . HIV Screening  11/17/1984  . PNEUMOCOCCAL POLYSACCHARIDE VACCINE (2) 11/27/2010  . PAP SMEAR  02/01/2013  . TETANUS/TDAP  05/15/2013  . INFLUENZA VACCINE  10/16/2015  . FOOT EXAM  02/12/2016    Past Medical History: Patient Active Problem List   Diagnosis Date Noted  . Complete rotator cuff tear of left shoulder 06/03/2013  . Rotator cuff tendinitis 03/25/2013  . Diabetic neuropathy ([place]) 09/28/2012  . CERVICAL POLYP 02/14/2010  . Hyperlipidemia 03/06/2008  . DM (diabetes mellitus), type 2, uncontrolled ([place]) 05/14/2006  . OBESITY, NOS 05/14/2006  . HYPERTENSION, BENIGN SYSTEMIC 05/14/2006    Medications: reviewed and updated Current Outpatient Prescriptions  Medication Sig Dispense Refill  . Blood Glucose Monitoring Suppl (ONE TOUCH ULTRA SYSTEM KIT) w/Device KIT 1 kit by Does not apply route once. 1 each 0  . gabapentin (NEURONTIN) 300 MG capsule Take 1 capsule (300 mg total) by mouth at bedtime. 90 capsule 3  . glucose blood test strip Use as instructed.   Dispense one month supply in QS for twice daily testing. 100 each 6  . Insulin Glargine (LANTUS) 100 UNIT/ML Solostar Pen Inject 26 Units into the skin every morning. 15 mL 5  . Insulin Pen Needle 31G X 5 MM MISC Use one needle per application 237 each 6  . Lancets (ONETOUCH ULTRASOFT) lancets Use to check blood sugar up to 3 times daily.  DG code 250.0 100 each 6  . losartan-hydrochlorothiazide (HYZAAR) 100-25 MG tablet TAKE ONE TABLET BY MOUTH ONCE DAILY 30 tablet 3  . Multiple Vitamins-Minerals (MULTIVITAMIN WITH MINERALS) tablet Take 1 tablet by mouth daily. Reported on 07/09/2015    . rosuvastatin (CRESTOR) 20 MG tablet Take 1  tablet (20 mg total) by mouth daily. 30 tablet 2   No current facility-administered medications for this visit.     Social Hx:  reports that she quit smoking about 17 years ago. She has never used smokeless tobacco.   Objective:   LMP 02/06/2016  Physical Exam  Gen: NAD, alert, cooperative with exam, well-appearing HEENT: NCAT, PERRL, clear conjunctiva, oropharynx clear, supple neck Cardiac: Regular rate and rhythm, normal S1/S2, no murmur, no edema, capillary refill brisk  Respiratory: Clear to auscultation bilaterally, no wheezes, non-labored breathing Gastrointestinal: soft, non tender, non distended, bowel sounds present Skin: no rashes, normal turgor  Neurological: no gross deficits.  Psych: good insight, normal mood and affect   Assessment & Plan:  No problem-specific Assessment & Plan notes found for this encounter.   Smitty Cords, MD [place], PGY-2

## 2016-03-19 ENCOUNTER — Encounter: Payer: Self-pay | Admitting: Podiatry

## 2016-03-19 ENCOUNTER — Ambulatory Visit (INDEPENDENT_AMBULATORY_CARE_PROVIDER_SITE_OTHER): Payer: Commercial Managed Care - HMO

## 2016-03-19 ENCOUNTER — Ambulatory Visit (INDEPENDENT_AMBULATORY_CARE_PROVIDER_SITE_OTHER): Payer: Commercial Managed Care - HMO | Admitting: Podiatry

## 2016-03-19 VITALS — BP 154/94 | HR 99 | Resp 16 | Ht 66.0 in | Wt 230.0 lb

## 2016-03-19 DIAGNOSIS — R200 Anesthesia of skin: Secondary | ICD-10-CM

## 2016-03-19 DIAGNOSIS — M722 Plantar fascial fibromatosis: Secondary | ICD-10-CM | POA: Diagnosis not present

## 2016-03-19 DIAGNOSIS — R202 Paresthesia of skin: Secondary | ICD-10-CM | POA: Diagnosis not present

## 2016-03-19 MED ORDER — TRIAMCINOLONE ACETONIDE 10 MG/ML IJ SUSP
10.0000 mg | Freq: Once | INTRAMUSCULAR | Status: AC
  Administered 2016-03-19: 10 mg

## 2016-03-19 NOTE — Patient Instructions (Signed)

## 2016-03-19 NOTE — Progress Notes (Signed)
   Subjective:    Patient ID: Virginia [name], female    DOB: 01/18/1971, 46 y.o.   MRN: [id number]  HPI Chief Complaint  Patient presents with  . Foot Pain    Bilateral; heel; pt stated, "pain comes and goes for the past 3 months"  . Toe Pain    Bilateral; all toes; pt stated, "pain comes and goes for the past 6 months"      Review of Systems  Gastrointestinal: Positive for abdominal distention.  Allergic/Immunologic: Positive for food allergies.  All other systems reviewed and are negative.      Objective:   Physical Exam        Assessment & Plan:

## 2016-03-20 NOTE — Progress Notes (Signed)
Subjective:     Patient ID: Virginia [name], female   DOB: 02/14/1971, 46 y.o.   MRN: [id number]  HPI patient states she's had a lot of pain in her heels for the last 3 months and overall foot pain for proximally 6 months. States it's worsened over that time   Review of Systems  All other systems reviewed and are negative.      Objective:   Physical Exam  Constitutional: She is oriented to person, place, and time.  Cardiovascular: Intact distal pulses.   Musculoskeletal: Normal range of motion.  Neurological: She is oriented to person, place, and time.  Skin: Skin is warm and dry.  Nursing note and vitals reviewed.  neurovascular status was found to be intact with muscle strength adequate range of motion within normal limits with patient noted to have inflammation and pain plantar aspect heel region bilateral with fluid buildup around the medial band. It is quite sore when pressed and makes it difficult to walk long distances and also she does have forefoot pain that's more nondescript in nature. Patient's found have good digital perfusion and is well oriented     Assessment:     Long-term diabetic who is not in great control who has exquisite tenderness plantar aspect heel region bilateral    Plan:     H&P x-rays reviewed and injected the plantar fascial bilateral 3 Milligan Kenalog 5 mill grams Xylocaine and applied fascial brace bilateral with instructions on usage. Gave instructions on physical therapy supportive therapy and will be seen back again in 2 weeks and will probably require long-term orthotic treatment for condition  X-ray report indicated spur formation with no indication stress fracture or advanced arthritis

## 2016-04-02 ENCOUNTER — Ambulatory Visit: Payer: Commercial Managed Care - HMO | Admitting: Podiatry

## 2016-04-03 ENCOUNTER — Ambulatory Visit: Payer: Commercial Managed Care - HMO | Admitting: Podiatry

## 2016-04-09 ENCOUNTER — Encounter: Payer: Self-pay | Admitting: Podiatry

## 2016-04-09 ENCOUNTER — Ambulatory Visit (INDEPENDENT_AMBULATORY_CARE_PROVIDER_SITE_OTHER): Payer: Commercial Managed Care - HMO | Admitting: Podiatry

## 2016-04-09 DIAGNOSIS — M722 Plantar fascial fibromatosis: Secondary | ICD-10-CM | POA: Diagnosis not present

## 2016-04-09 MED ORDER — DICLOFENAC SODIUM 75 MG PO TBEC: 75.0000 mg | DELAYED_RELEASE_TABLET | Freq: Two times a day (BID) | ORAL | 2 refills | Status: DC

## 2016-04-09 NOTE — Patient Instructions (Signed)

## 2016-04-09 NOTE — Progress Notes (Signed)
Subjective:     Patient ID: Virginia [name], female   DOB: 09/25/1970, 46 y.o.   MRN: [id number]  HPI patient states that she's improve with discomfort if she still does a lot of walking   Review of Systems     Objective:   Physical Exam Neurovascular status intact with patient's heel improved but still present with pain upon deep palpation with fluid buildup    Assessment:     Plantar fasciitis continuing right over left with depression of the arch as mechanical dysfunction    Plan:     H&P condition reviewed and discussed long-term supportive shoe gear usage not going barefoot and physical therapy. Scanned for custom orthotic devices to reduce plantar stresses

## 2016-04-30 ENCOUNTER — Ambulatory Visit (INDEPENDENT_AMBULATORY_CARE_PROVIDER_SITE_OTHER): Payer: Commercial Managed Care - HMO | Admitting: Podiatry

## 2016-04-30 DIAGNOSIS — M722 Plantar fascial fibromatosis: Secondary | ICD-10-CM

## 2016-04-30 NOTE — Progress Notes (Signed)
Patient presents for orthotic pick up.  Verbal and written break in and wear instructions given.  Patient will follow up in 4 weeks if symptoms worsen or fail to improve. 

## 2016-04-30 NOTE — Patient Instructions (Signed)

## 2016-05-29 ENCOUNTER — Other Ambulatory Visit: Payer: Self-pay | Admitting: Obstetrics and Gynecology

## 2016-05-29 DIAGNOSIS — Z1231 Encounter for screening mammogram for malignant neoplasm of breast: Secondary | ICD-10-CM

## 2016-05-29 DIAGNOSIS — Z124 Encounter for screening for malignant neoplasm of cervix: Secondary | ICD-10-CM | POA: Diagnosis not present

## 2016-05-29 DIAGNOSIS — Z01419 Encounter for gynecological examination (general) (routine) without abnormal findings: Secondary | ICD-10-CM | POA: Diagnosis not present

## 2016-06-20 ENCOUNTER — Ambulatory Visit
Admission: RE | Admit: 2016-06-20 | Discharge: 2016-06-20 | Disposition: A | Discharge status: Home / Self Care | Payer: Commercial Managed Care - HMO | Source: Ambulatory Visit | Attending: Obstetrics and Gynecology | Admitting: Obstetrics and Gynecology

## 2016-06-20 DIAGNOSIS — Z1231 Encounter for screening mammogram for malignant neoplasm of breast: Secondary | ICD-10-CM | POA: Diagnosis not present

## 2016-06-25 ENCOUNTER — Telehealth: Payer: Self-pay | Admitting: Family Medicine

## 2016-06-25 NOTE — Telephone Encounter (Signed)
Noted last measured A1c and request she call back to sched DM f/u - Virginia [name]

## 2016-07-31 ENCOUNTER — Telehealth: Payer: Self-pay | Admitting: Family Medicine

## 2016-07-31 NOTE — Telephone Encounter (Signed)
Pre-visit call. No answer, LMOVM.  °

## 2016-08-01 ENCOUNTER — Ambulatory Visit: Payer: Commercial Managed Care - HMO | Admitting: Family Medicine

## 2016-08-04 DIAGNOSIS — M25571 Pain in right ankle and joints of right foot: Secondary | ICD-10-CM | POA: Diagnosis not present

## 2016-08-04 DIAGNOSIS — S82831A Other fracture of upper and lower end of right fibula, initial encounter for closed fracture: Secondary | ICD-10-CM | POA: Diagnosis not present

## 2016-08-04 DIAGNOSIS — E1121 Type 2 diabetes mellitus with diabetic nephropathy: Secondary | ICD-10-CM | POA: Diagnosis not present

## 2016-08-04 DIAGNOSIS — S8264XA Nondisplaced fracture of lateral malleolus of right fibula, initial encounter for closed fracture: Secondary | ICD-10-CM | POA: Diagnosis not present

## 2016-08-07 DIAGNOSIS — S8264XD Nondisplaced fracture of lateral malleolus of right fibula, subsequent encounter for closed fracture with routine healing: Secondary | ICD-10-CM | POA: Diagnosis not present

## 2016-08-21 ENCOUNTER — Telehealth: Payer: Self-pay

## 2016-08-21 NOTE — Progress Notes (Signed)
   Subjective:    Patient ID: Virginia [name] , female   DOB: 02/23/1971 , 46 y.o..   MRN: [id number]  HPI  Virginia [name] is here for  Chief Complaint  Patient presents with  . Follow-up   1. Chronic Diabetes Disease Monitoring  Blood Sugar Ranges: Does not check regularly. Occasionally will check and it will be in the 200's.   Polyuria: Yes  Visual problems: No  Last hemoglobin A1C: 14 on 02/18/16 Medication Compliance: Yes Medication Side Effects  Hypoglycemia: No  Preventitive Health Care  Eye Exam: overdue  Foot Exam: overdue  Diet pattern: admits to craving sweets often. Eats lots of sweet foods  Exercise: Limited as she just broke her ankle and is in a boot.   Review of Systems: Per HPI. All other systems reviewed and are negative.  Health Maintenance Due  Topic Date Due  . OPHTHALMOLOGY EXAM  11/18/1979  . HIV Screening  11/17/1984  . PNEUMOCOCCAL POLYSACCHARIDE VACCINE (2) 11/27/2010  . PAP SMEAR  02/01/2013  . TETANUS/TDAP  05/15/2013  . FOOT EXAM  02/12/2016  . LIPID PANEL  08/18/2016  . HEMOGLOBIN A1C  08/18/2016    Past Medical History: Patient Active Problem List   Diagnosis Date Noted  . Complete rotator cuff tear of left shoulder 06/03/2013  . Rotator cuff tendinitis 03/25/2013  . Diabetic neuropathy (HCC) 09/28/2012  . CERVICAL POLYP 02/14/2010  . Hyperlipidemia 03/06/2008  . DM (diabetes mellitus), type 2, uncontrolled (HCC) 05/14/2006  . OBESITY, NOS 05/14/2006  . HYPERTENSION, BENIGN SYSTEMIC 05/14/2006    Medications: reviewed and updated  Social Hx:  reports that she quit smoking about 18 years ago. She has never used smokeless tobacco.   Objective:   BP 134/72   Pulse 91   Temp 97.8 F (36.6 C) (Oral)   Ht 5\' 6"  (1.676 m)   Wt 241 lb (109.3 kg)   LMP 02/15/2016 (Approximate)   SpO2 97%   BMI 38.90 kg/m  Physical Exam  Gen: NAD, alert, cooperative with exam, well-appearing HEENT: NCAT, PERRL, clear conjunctiva, oropharynx clear,  supple neck Cardiac: Regular rate and rhythm, normal S1/S2, no murmur, no edema, capillary refill brisk  Respiratory: Clear to auscultation bilaterally, no wheezes, non-labored breathing Gastrointestinal: soft, non tender, non distended, bowel sounds present Skin: no rashes, normal turgor  Neurological: no gross deficits.  Psych: good insight, normal mood and affect  Assessment & Plan:  DM (diabetes mellitus), type 2, uncontrolled (HCC) Uncontrolled. Hgb A1C 14.2 today. Still not checking glucose. Patient honest about her lack of taking control of her diabetes but seems sincere in her interest in changing. No financial or social barriers identified to compliance.  - Increase Lantus to 25 units daily. Difficult to titrate without knowing her glucose levels at home. - Has not tolerating metformin before due to significant GI upset, will try 500 mg XR Metformin daily and see if that makes a difference - She will check glucose qam and qhs and return next week to discuss glucose levels and make any adjustments if needed - Can consider added another inject ible agent to diabetic regimen if she doesn't tolerate metformin - Referred again to nutritionist. She will call Dr. Gerilyn PilgrimSykes.    Anders Simmondshristina Gambino, MD [facility], PGY-2

## 2016-08-21 NOTE — Telephone Encounter (Signed)
Pt contacted, no answer, so a VM was left reminding pt of diabetes apt tomm with Dr. [name]. Pt was reminded to bring all of her medications and to arrive 15 minutes before. Pt was advised to call the office if transportation is an issue. Office call back number was left.

## 2016-08-22 ENCOUNTER — Encounter: Payer: Self-pay | Admitting: Family Medicine

## 2016-08-22 ENCOUNTER — Ambulatory Visit (INDEPENDENT_AMBULATORY_CARE_PROVIDER_SITE_OTHER): Payer: Commercial Managed Care - HMO | Admitting: Family Medicine

## 2016-08-22 ENCOUNTER — Other Ambulatory Visit: Payer: Self-pay | Admitting: Family Medicine

## 2016-08-22 VITALS — BP 134/72 | HR 91 | Temp 97.8°F | Ht 66.0 in | Wt 241.0 lb

## 2016-08-22 DIAGNOSIS — I10 Essential (primary) hypertension: Secondary | ICD-10-CM

## 2016-08-22 DIAGNOSIS — E1165 Type 2 diabetes mellitus with hyperglycemia: Secondary | ICD-10-CM

## 2016-08-22 DIAGNOSIS — IMO0001 Reserved for inherently not codable concepts without codable children: Secondary | ICD-10-CM

## 2016-08-22 LAB — POCT GLYCOSYLATED HEMOGLOBIN (HGB A1C): Hemoglobin A1C: 14.2

## 2016-08-22 MED ORDER — INSULIN GLARGINE 100 UNIT/ML SOLOSTAR PEN: 30.0000 [IU] | PEN_INJECTOR | Freq: Every morning | SUBCUTANEOUS | 5 refills | Status: DC

## 2016-08-22 MED ORDER — INSULIN GLARGINE 100 UNIT/ML SOLOSTAR PEN: 26.0000 [IU] | PEN_INJECTOR | Freq: Every morning | SUBCUTANEOUS | 5 refills | Status: DC

## 2016-08-22 MED ORDER — ONETOUCH ULTRA SYSTEM W/DEVICE KIT: 1.0000 | PACK | Freq: Once | 0 refills | Status: DC

## 2016-08-22 MED ORDER — METFORMIN HCL ER 500 MG PO TB24: 500.0000 mg | ORAL_TABLET | Freq: Every day | ORAL | 3 refills | Status: DC

## 2016-08-22 MED ORDER — GLUCOSE BLOOD VI STRP: ORAL_STRIP | 12 refills | Status: DC

## 2016-08-22 MED ORDER — ONETOUCH ULTRASOFT LANCETS MISC: 6 refills | Status: DC

## 2016-08-22 MED ORDER — LOSARTAN POTASSIUM-HCTZ 100-25 MG PO TABS: 1.0000 | ORAL_TABLET | Freq: Every day | ORAL | 3 refills | Status: DC

## 2016-08-22 MED ORDER — ONETOUCH VERIO W/DEVICE KIT: 1.0000 | PACK | Freq: Every day | 1 refills | Status: DC

## 2016-08-22 MED ORDER — INSULIN PEN NEEDLE 31G X 5 MM MISC: 6 refills | Status: DC

## 2016-08-22 MED ORDER — ONETOUCH DELICA LANCETS FINE MISC: 1.0000 | Freq: Two times a day (BID) | 5 refills | Status: DC

## 2016-08-22 NOTE — Patient Instructions (Signed)
Thank you for coming in today, it was so nice to see you! Today we talked about:    Diabetes: Increase your Lantus to 30 units every day. We are trying metformin 500 mg extended release. Please ask the pharmacist if he can break this in half. Take this at your biggest meal of the day with a full glass of water.  Check your sugars every morning when you wake up and every night before he would've been. Keep a record of your sugars in we will review them together in one week.  I have refilled all of your diabetes supplies  I think that you would greatly benefit from seeing a nutritionist.  Please call Dr Jenne Campus at [phone] to schedule an appointment.  Please follow up in 1 week for your diabetes. You can schedule this appointment at the front desk before you leave or call the clinic.  Bring in all your medications or supplements to each appointment for review.   If you have any questions or concerns, please do not hesitate to call the office at [phone]. You can also message me directly via MyChart.   Sincerely,  Smitty Cords, MD    Diabetes Mellitus and Standards of Medical Care Managing diabetes (diabetes mellitus) can be complicated. Your diabetes treatment may be managed by a team of health care providers, including:  A diet and nutrition specialist (registered dietitian).  A nurse.  A certified diabetes educator (CDE).  A diabetes specialist (endocrinologist).  An eye doctor.  A primary care provider.  A dentist.  Your health care providers follow a schedule in order to help you get the best quality of care. The following schedule is a general guideline for your diabetes management plan. Your health care providers may also give you more specific instructions. HbA1c ( hemoglobin A1c) test This test provides information about blood sugar (glucose) control over the previous 2-3 months. It is used to check whether your diabetes management plan needs to be  adjusted.  If you are meeting your treatment goals, this test is done at least 2 times a year.  If you are not meeting treatment goals or if your treatment goals have changed, this test is done 4 times a year.  Blood pressure test  This test is done at every routine medical visit. For most people, the goal is less than 130/80. Ask your health care provider what your goal blood pressure should be. Dental and eye exams  Visit your dentist two times a year.  If you have type 1 diabetes, get an eye exam 3-5 years after you are diagnosed, and then once a year after your first exam. ? If you were diagnosed with type 1 diabetes as a child, get an eye exam when you are age 26 or older and have had diabetes for 3-5 years. After the first exam, you should get an eye exam once a year.  If you have type 2 diabetes, have an eye exam as soon as you are diagnosed, and then once a year after your first exam. Foot care exam  Visual foot exams are done at every routine medical visit. The exams check for cuts, bruises, redness, blisters, sores, or other problems with the feet.  A complete foot exam is done by your health care provider once a year. This exam includes an inspection of the structure and skin of your feet, and a check of the pulses and sensation in your feet. ? Type 1 diabetes: Get your first  exam 3-5 years after diagnosis. ? Type 2 diabetes: Get your first exam as soon as you are diagnosed.  Check your feet every day for cuts, bruises, redness, blisters, or sores. If you have any of these or other problems that are not healing, contact your health care provider. Kidney function test ( urine microalbumin)  This test is done once a year. ? Type 1 diabetes: Get your first test 5 years after diagnosis. ? Type 2 diabetes: Get your first test as soon as you are diagnosed.  If you have chronic kidney disease (CKD), get a serum creatinine and estimated glomerular filtration rate (eGFR) test once  a year. Lipid profile (cholesterol, HDL, LDL, triglycerides)  This test should be done when you are diagnosed with diabetes, and every 5 years after the first test. If you are on medicines to lower your cholesterol, you may need to get this test done every year. ? The goal for LDL is less than 100 mg/dL (5.5 mmol/L). If you are at high risk, the goal is less than 70 mg/dL (3.9 mmol/L). ? The goal for HDL is 40 mg/dL (2.2 mmol/L) for men and 50 mg/dL(2.8 mmol/L) for women. An HDL cholesterol of 60 mg/dL (3.3 mmol/L) or higher gives some protection against heart disease. ? The goal for triglycerides is less than 150 mg/dL (8.3 mmol/L). Immunizations  The yearly flu (influenza) vaccine is recommended for everyone 6 months or older who has diabetes.  The pneumonia (pneumococcal) vaccine is recommended for everyone 2 years or older who has diabetes. If you are 74 or older, you may get the pneumonia vaccine as a series of two separate shots.  The hepatitis B vaccine is recommended for adults shortly after they have been diagnosed with diabetes.  The Tdap (tetanus, diphtheria, and pertussis) vaccine should be given: ? According to normal childhood vaccination schedules, for children. ? Every 10 years, for adults who have diabetes.  The shingles vaccine is recommended for people who have had chicken pox and are 50 years or older. Mental and emotional health  Screening for symptoms of eating disorders, anxiety, and depression is recommended at the time of diagnosis and afterward as needed. If your screening shows that you have symptoms (you have a positive screening result), you may need further evaluation and be referred to a mental health care provider. Diabetes self-management education  Education about how to manage your diabetes is recommended at diagnosis and ongoing as needed. Treatment plan  Your treatment plan will be reviewed at every medical visit. Summary  Managing diabetes  (diabetes mellitus) can be complicated. Your diabetes treatment may be managed by a team of health care providers.  Your health care providers follow a schedule in order to help you get the best quality of care.  Standards of care including having regular physical exams, blood tests, blood pressure monitoring, immunizations, screening tests, and education about how to manage your diabetes.  Your health care providers may also give you more specific instructions based on your individual health. This information is not intended to replace advice given to you by your health care provider. Make sure you discuss any questions you have with your health care provider. Document Released: 12/29/2008 Document Revised: 11/30/2015 Document Reviewed: 11/30/2015 Elsevier Interactive Patient Education  [employer].

## 2016-08-23 NOTE — Assessment & Plan Note (Addendum)
Uncontrolled. Hgb A1C 14.2 today. Still not checking glucose. Patient honest about her lack of taking control of her diabetes but seems sincere in her interest in changing. No financial or social barriers identified to compliance.  - Increase Lantus to 25 units daily. Difficult to titrate without knowing her glucose levels at home. - Has not tolerating metformin before due to significant GI upset, will try 500 mg XR Metformin daily and see if that makes a difference - She will check glucose qam and qhs and return next week to discuss glucose levels and make any adjustments if needed - Can consider added another inject ible agent to diabetic regimen if she doesn't tolerate metformin - Referred again to nutritionist. She will call Dr. Gerilyn PilgrimSykes.  - Deferred foot exam due to broken right ankle in boot - Referral to optho for diabetic eye exam

## 2016-08-25 ENCOUNTER — Telehealth: Payer: Self-pay | Admitting: *Deleted

## 2016-08-25 MED ORDER — GLUCOSE BLOOD VI STRP: ORAL_STRIP | 12 refills | Status: DC

## 2016-08-25 NOTE — Telephone Encounter (Signed)
Pharmacy needs testing frequency on test strips in order bill insurance. Please resend rx with testing frequency.

## 2016-08-25 NOTE — Telephone Encounter (Signed)
Fixed prescription. Thanks!

## 2016-08-27 ENCOUNTER — Other Ambulatory Visit: Payer: Self-pay | Admitting: Family Medicine

## 2016-08-27 NOTE — Telephone Encounter (Signed)
New Rx for test strips were sent in on 08/25/16 by Dr. [name].  Clovis PuMartin, Ratasha Fabre L, RN

## 2016-08-27 NOTE — Telephone Encounter (Signed)
Pt states her test strips were called in wrong and she wasn't able to pick them up. Pt uses [facility] on [address]. ep

## 2016-08-29 ENCOUNTER — Telehealth: Payer: Self-pay | Admitting: Family Medicine

## 2016-08-29 ENCOUNTER — Ambulatory Visit: Payer: Commercial Managed Care - HMO | Admitting: Family Medicine

## 2016-08-29 MED ORDER — GLUCOSE BLOOD VI STRP: ORAL_STRIP | 12 refills | Status: DC

## 2016-08-29 NOTE — Telephone Encounter (Signed)
Pt called because she is out of test strips and need more called in. There was a problem with the first prescription and the doctor called another one in. The pharmacy said that they havent received the corrected prescription. Can we call this in. jw

## 2016-08-29 NOTE — Telephone Encounter (Signed)
Refill placed for test strips

## 2016-09-08 DIAGNOSIS — S8264XD Nondisplaced fracture of lateral malleolus of right fibula, subsequent encounter for closed fracture with routine healing: Secondary | ICD-10-CM | POA: Diagnosis not present

## 2016-09-10 DIAGNOSIS — R262 Difficulty in walking, not elsewhere classified: Secondary | ICD-10-CM | POA: Diagnosis not present

## 2016-09-10 DIAGNOSIS — M25571 Pain in right ankle and joints of right foot: Secondary | ICD-10-CM | POA: Diagnosis not present

## 2016-09-10 DIAGNOSIS — M25671 Stiffness of right ankle, not elsewhere classified: Secondary | ICD-10-CM | POA: Diagnosis not present

## 2016-09-11 ENCOUNTER — Ambulatory Visit: Payer: Commercial Managed Care - HMO | Admitting: Family Medicine

## 2016-09-15 DIAGNOSIS — M25671 Stiffness of right ankle, not elsewhere classified: Secondary | ICD-10-CM | POA: Diagnosis not present

## 2016-09-15 DIAGNOSIS — R262 Difficulty in walking, not elsewhere classified: Secondary | ICD-10-CM | POA: Diagnosis not present

## 2016-09-15 DIAGNOSIS — M25571 Pain in right ankle and joints of right foot: Secondary | ICD-10-CM | POA: Diagnosis not present

## 2016-09-19 DIAGNOSIS — M25671 Stiffness of right ankle, not elsewhere classified: Secondary | ICD-10-CM | POA: Diagnosis not present

## 2016-09-19 DIAGNOSIS — M25571 Pain in right ankle and joints of right foot: Secondary | ICD-10-CM | POA: Diagnosis not present

## 2016-09-19 DIAGNOSIS — R262 Difficulty in walking, not elsewhere classified: Secondary | ICD-10-CM | POA: Diagnosis not present

## 2016-09-22 DIAGNOSIS — M25671 Stiffness of right ankle, not elsewhere classified: Secondary | ICD-10-CM | POA: Diagnosis not present

## 2016-09-22 DIAGNOSIS — R262 Difficulty in walking, not elsewhere classified: Secondary | ICD-10-CM | POA: Diagnosis not present

## 2016-09-22 DIAGNOSIS — M25571 Pain in right ankle and joints of right foot: Secondary | ICD-10-CM | POA: Diagnosis not present

## 2016-09-25 DIAGNOSIS — M25671 Stiffness of right ankle, not elsewhere classified: Secondary | ICD-10-CM | POA: Diagnosis not present

## 2016-09-25 DIAGNOSIS — R262 Difficulty in walking, not elsewhere classified: Secondary | ICD-10-CM | POA: Diagnosis not present

## 2016-09-25 DIAGNOSIS — M25571 Pain in right ankle and joints of right foot: Secondary | ICD-10-CM | POA: Diagnosis not present

## 2016-09-29 DIAGNOSIS — R262 Difficulty in walking, not elsewhere classified: Secondary | ICD-10-CM | POA: Diagnosis not present

## 2016-09-29 DIAGNOSIS — M25571 Pain in right ankle and joints of right foot: Secondary | ICD-10-CM | POA: Diagnosis not present

## 2016-09-29 DIAGNOSIS — M25671 Stiffness of right ankle, not elsewhere classified: Secondary | ICD-10-CM | POA: Diagnosis not present

## 2016-10-02 DIAGNOSIS — R262 Difficulty in walking, not elsewhere classified: Secondary | ICD-10-CM | POA: Diagnosis not present

## 2016-10-02 DIAGNOSIS — M25571 Pain in right ankle and joints of right foot: Secondary | ICD-10-CM | POA: Diagnosis not present

## 2016-10-02 DIAGNOSIS — M25671 Stiffness of right ankle, not elsewhere classified: Secondary | ICD-10-CM | POA: Diagnosis not present

## 2016-10-06 DIAGNOSIS — M25571 Pain in right ankle and joints of right foot: Secondary | ICD-10-CM | POA: Diagnosis not present

## 2016-10-07 DIAGNOSIS — R262 Difficulty in walking, not elsewhere classified: Secondary | ICD-10-CM | POA: Diagnosis not present

## 2016-10-07 DIAGNOSIS — M25671 Stiffness of right ankle, not elsewhere classified: Secondary | ICD-10-CM | POA: Diagnosis not present

## 2016-10-07 DIAGNOSIS — M25571 Pain in right ankle and joints of right foot: Secondary | ICD-10-CM | POA: Diagnosis not present

## 2016-11-10 DIAGNOSIS — M25571 Pain in right ankle and joints of right foot: Secondary | ICD-10-CM | POA: Diagnosis not present

## 2016-12-07 DIAGNOSIS — E119 Type 2 diabetes mellitus without complications: Secondary | ICD-10-CM | POA: Diagnosis not present

## 2016-12-07 DIAGNOSIS — Z794 Long term (current) use of insulin: Secondary | ICD-10-CM | POA: Diagnosis not present

## 2016-12-07 DIAGNOSIS — H1032 Unspecified acute conjunctivitis, left eye: Secondary | ICD-10-CM | POA: Diagnosis not present

## 2016-12-30 ENCOUNTER — Encounter (HOSPITAL_COMMUNITY): Payer: Self-pay | Admitting: Emergency Medicine

## 2016-12-30 ENCOUNTER — Emergency Department (HOSPITAL_COMMUNITY)
Admission: EM | Admit: 2016-12-30 | Discharge: 2016-12-30 | Disposition: A | Discharge status: Home / Self Care | Payer: 59 | Attending: Physician Assistant | Admitting: Physician Assistant

## 2016-12-30 ENCOUNTER — Emergency Department (HOSPITAL_COMMUNITY): Payer: 59

## 2016-12-30 DIAGNOSIS — R42 Dizziness and giddiness: Secondary | ICD-10-CM | POA: Diagnosis not present

## 2016-12-30 DIAGNOSIS — R739 Hyperglycemia, unspecified: Secondary | ICD-10-CM | POA: Diagnosis not present

## 2016-12-30 DIAGNOSIS — Z794 Long term (current) use of insulin: Secondary | ICD-10-CM | POA: Diagnosis not present

## 2016-12-30 DIAGNOSIS — E1165 Type 2 diabetes mellitus with hyperglycemia: Secondary | ICD-10-CM | POA: Diagnosis not present

## 2016-12-30 DIAGNOSIS — R0789 Other chest pain: Secondary | ICD-10-CM | POA: Diagnosis not present

## 2016-12-30 DIAGNOSIS — I10 Essential (primary) hypertension: Secondary | ICD-10-CM | POA: Insufficient documentation

## 2016-12-30 DIAGNOSIS — Z79899 Other long term (current) drug therapy: Secondary | ICD-10-CM | POA: Insufficient documentation

## 2016-12-30 DIAGNOSIS — R079 Chest pain, unspecified: Secondary | ICD-10-CM | POA: Diagnosis not present

## 2016-12-30 DIAGNOSIS — Z87891 Personal history of nicotine dependence: Secondary | ICD-10-CM | POA: Diagnosis not present

## 2016-12-30 LAB — BASIC METABOLIC PANEL
Anion gap: 12 (ref 5–15)
BUN: 20 mg/dL (ref 6–20)
CHLORIDE: 93 mmol/L — AB (ref 101–111)
CO2: 25 mmol/L (ref 22–32)
CREATININE: 1.2 mg/dL — AB (ref 0.44–1.00)
Calcium: 9.1 mg/dL (ref 8.9–10.3)
GFR calc Af Amer: >60 mL/min (ref >60)
GFR calc non Af Amer: 53 mL/min — ABNORMAL LOW (ref >60)
GLUCOSE: 544 mg/dL — AB (ref 65–99)
POTASSIUM: 3.8 mmol/L (ref 3.5–5.1)
SODIUM: 130 mmol/L — AB (ref 135–145)

## 2016-12-30 LAB — CBG MONITORING, ED: Glucose-Capillary: 438 mg/dL — ABNORMAL HIGH (ref 65–99)

## 2016-12-30 LAB — CBC
HEMATOCRIT: 36.9 % (ref 36.0–46.0)
Hemoglobin: 12 g/dL (ref 12.0–15.0)
MCH: 25.1 pg — AB (ref 26.0–34.0)
MCHC: 32.5 g/dL (ref 30.0–36.0)
MCV: 77 fL — AB (ref 78.0–100.0)
PLATELETS: 238 10*3/uL (ref 150–400)
RBC: 4.79 MIL/uL (ref 3.87–5.11)
RDW: 13.7 % (ref 11.5–15.5)
WBC: 6.4 10*3/uL (ref 4.0–10.5)

## 2016-12-30 LAB — I-STAT TROPONIN, ED
Troponin i, poc: 0 ng/mL (ref 0.00–0.08)
Troponin i, poc: 0 ng/mL (ref 0.00–0.08)

## 2016-12-30 MED ORDER — OMEPRAZOLE 20 MG PO CPDR: 20.0000 mg | DELAYED_RELEASE_CAPSULE | Freq: Two times a day (BID) | ORAL | 0 refills | Status: DC

## 2016-12-30 MED ORDER — ONETOUCH DELICA LANCETS FINE MISC: 1.0000 | Freq: Two times a day (BID) | 5 refills | Status: DC

## 2016-12-30 MED ORDER — SODIUM CHLORIDE 0.9 % IV BOLUS (SEPSIS)
1000.0000 mL | Freq: Once | INTRAVENOUS | Status: AC
  Administered 2016-12-30: 1000 mL via INTRAVENOUS

## 2016-12-30 MED ORDER — GLUCOSE BLOOD VI STRP: ORAL_STRIP | 12 refills | Status: DC

## 2016-12-30 MED ORDER — SODIUM CHLORIDE 0.9 % IV BOLUS (SEPSIS)
1000.0000 mL | Freq: Once | INTRAVENOUS | Status: AC
Start: 2016-12-30 — End: 2016-12-30
  Administered 2016-12-30: 1000 mL via INTRAVENOUS

## 2016-12-30 MED ORDER — GI COCKTAIL ~~LOC~~
30.0000 mL | Freq: Once | ORAL | Status: AC
  Administered 2016-12-30: 30 mL via ORAL
  Filled 2016-12-30: qty 30

## 2016-12-30 NOTE — ED Triage Notes (Signed)
Patient reports central chest pain with exertional dyspnea , gastric reflux/emesis and mild dizziness/diaphoresis onset last week . No cough or congestion .

## 2016-12-30 NOTE — Discharge Instructions (Signed)
Take Prilosec twice daily before meals.I have also refilled your lancets and test strips. Follow-up with your primary care physician in the next 3-4 days for reevaluation. Take your diabetes and other medications as prescribed. Return to the ED if any concerning signs or symptoms develop such as persistent sharp chest pain, fevers, passing out.
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
## 2016-12-30 NOTE — ED Notes (Signed)
Critical result from Lab- glucose 544. MD aware.

## 2016-12-31 ENCOUNTER — Ambulatory Visit (INDEPENDENT_AMBULATORY_CARE_PROVIDER_SITE_OTHER): Payer: 59 | Admitting: Family Medicine

## 2016-12-31 VITALS — BP 123/84 | HR 99 | Temp 98.4°F | Wt 235.6 lb

## 2016-12-31 DIAGNOSIS — Z794 Long term (current) use of insulin: Secondary | ICD-10-CM | POA: Diagnosis not present

## 2016-12-31 DIAGNOSIS — Z23 Encounter for immunization: Secondary | ICD-10-CM | POA: Diagnosis not present

## 2016-12-31 DIAGNOSIS — E11649 Type 2 diabetes mellitus with hypoglycemia without coma: Secondary | ICD-10-CM | POA: Diagnosis not present

## 2016-12-31 DIAGNOSIS — E1165 Type 2 diabetes mellitus with hyperglycemia: Secondary | ICD-10-CM | POA: Diagnosis not present

## 2016-12-31 LAB — GLUCOSE, POCT (MANUAL RESULT ENTRY): POC GLUCOSE: 388 mg/dL — AB (ref 70–99)
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
## 2016-12-31 NOTE — Patient Instructions (Signed)
Thank you for coming in to see Virginia [name] today. Please see below to review our plan for today's visit.  1. Increase your Lantus to 34 units daily. 2. Please schedule an appointment with your PCP Dr. [name] at her next available appointment.  Please call the clinic at [phone] if your symptoms worsen or you have any concerns. It was my pleasure to see you. -- Durward Parcelavid Gavyn Ybarra, DO [facility], PGY-2

## 2016-12-31 NOTE — Assessment & Plan Note (Signed)
Acute. Uncontrolled. Symptoms of fatigue likely due to persistent hyperglycemia due to non-adherence to insulin regimen and poor glucose monitoring. Patient well-appearing at present without signs of DKA or HHS. CBG improved from yesterday now at 384. Patient states she has plenty of her insulin and supplies. Patient seems to understand her poor control of diabetes is due to her lack of responsibility. She appears to be well educated in her condition and what to do with hypoglycemia. --Instructed patient to increase Lantus from 30 to 34 units daily --Reviewed precautions when to seek immediate care --Requested patient schedule follow-up with PCP next available appointment

## 2017-01-04 ENCOUNTER — Other Ambulatory Visit: Payer: Self-pay | Admitting: Family Medicine

## 2017-01-04 DIAGNOSIS — I10 Essential (primary) hypertension: Secondary | ICD-10-CM
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
## 2017-01-09 ENCOUNTER — Ambulatory Visit (INDEPENDENT_AMBULATORY_CARE_PROVIDER_SITE_OTHER): Payer: 59 | Admitting: Family Medicine

## 2017-01-09 ENCOUNTER — Telehealth: Payer: Self-pay | Admitting: Family Medicine

## 2017-01-09 ENCOUNTER — Encounter: Payer: Self-pay | Admitting: Family Medicine

## 2017-01-09 VITALS — BP 122/78 | HR 95 | Temp 98.3°F | Ht 66.0 in | Wt 259.0 lb

## 2017-01-09 DIAGNOSIS — E11649 Type 2 diabetes mellitus with hypoglycemia without coma: Secondary | ICD-10-CM

## 2017-01-09 LAB — POCT GLYCOSYLATED HEMOGLOBIN (HGB A1C): HEMOGLOBIN A1C: 14.5

## 2017-01-09 MED ORDER — INSULIN GLARGINE 100 UNIT/ML SOLOSTAR PEN: 38.0000 [IU] | PEN_INJECTOR | Freq: Every morning | SUBCUTANEOUS | 5 refills | Status: DC

## 2017-01-09 MED ORDER — OMEPRAZOLE 20 MG PO CPDR: 20.0000 mg | DELAYED_RELEASE_CAPSULE | Freq: Two times a day (BID) | ORAL | 0 refills | Status: DC

## 2017-01-09 NOTE — Telephone Encounter (Signed)
Needs a work note for todays visit.  She will be here in 10 minutes to get it

## 2017-01-09 NOTE — Patient Instructions (Addendum)
Thank you for coming in today, it was so nice to see you! Today we talked about:    Diabetes: Your A1C and sugars are still high so we will need to make some adjustments. Increase your lantus by 2 units every night until your fasting glucose when you wake up is less than 150.   Please follow up in 3 weeks with Dr. [name] for diabetes  Number for [facility] is [phone], please call to get scheduled  When you check out schedule an appt with Dr. Raymondo BandKoval for diabetes. We may start Victoza at that visit   If you have any questions or concerns, please do not hesitate to call the office at [phone]. You can also message me directly via MyChart.   Sincerely,  Anders Simmondshristina Greydis Stlouis, MD

## 2017-01-11 NOTE — Assessment & Plan Note (Addendum)
Uncontrolled and worsening. A1C went from 14.2 to 14.5. Patient continues to be transparent about not consistently checking glucose, eating properly, or exercising at all. No financial or social barrier identified to compliance. She is consistently taking Lantus per her report and has been self titrating up based on fasting glucose. Did not tolerate Metformin in the past due to GI upset and refuses to try again.  - Gave her # for [facility] to reschedule her appt - Foot exam performed today - Continue 36 units of Lantus qhs. Patient feels comfortable increasing insulin in increments of 2 units/day until fasting glucose under 150 - Due to very high A1C, she would likely benefit from starting a second medication, perhaps Victoza if insurance allows. Briefly discussed this with patient. - Would like for patient to see Dr. Raymondo BandKoval for diabetes and medication recommendations, appreciate his assistance - Asked patient to bring meter with her to appt with Dr. Raymondo BandKoval - Follow up in 1 month

## 2017-01-15 ENCOUNTER — Ambulatory Visit (INDEPENDENT_AMBULATORY_CARE_PROVIDER_SITE_OTHER): Payer: 59 | Admitting: Pharmacist

## 2017-01-15 ENCOUNTER — Encounter: Payer: Self-pay | Admitting: Pharmacist

## 2017-01-15 DIAGNOSIS — E11649 Type 2 diabetes mellitus with hypoglycemia without coma: Secondary | ICD-10-CM

## 2017-01-15 MED ORDER — INSULIN GLARGINE 100 UNIT/ML SOLOSTAR PEN: 42.0000 [IU] | PEN_INJECTOR | Freq: Every morning | SUBCUTANEOUS | Status: DC

## 2017-01-15 MED ORDER — LIRAGLUTIDE 18 MG/3ML ~~LOC~~ SOPN: 0.6000 mg | PEN_INJECTOR | Freq: Every day | SUBCUTANEOUS | 1 refills | Status: DC

## 2017-01-15 NOTE — Assessment & Plan Note (Signed)
Diabetes longstanding, currently uncontrolled despite use of basal insulin. Patient denies hypoglycemic events and is able to verbalize appropriate hypoglycemia management plan. Patient reports adherence with medication. Control is suboptimal due to sedentary lifestyle and history of medication non-adherence. Advised patient to schedule an eye appointment and dentist appointments - discussed the importance of these appointments with the patient.  At last visit with Dr. [name] on 01/09/17, patient was advised to increase her Lantus dose of 36 units daily by increments of 2 units/day, until fasting glucose was under 150. Patient reported taking 42 units daily for the last couple of days. Continued basal insulin Lantus (insulin glargine) at 42 units daily, and told the patient to stop increasing by 2 units/day. Started Victoza (liraglutide) at 0.6 mg daily, titration to 1.2 mg daily in 1 week.  Patient educated on purpose, proper use and potential adverse effects of nausea.  Following instruction patient verbalized understanding of treatment plan.

## 2017-01-15 NOTE — Patient Instructions (Addendum)
Nice to see you again. Continue Lantus 42 units once daily.  Start Victoza 0.6 mg once daily for 1 week, then increase to 1.2 mg once daily. If you notice your blood sugar is 100 or less, please call us for more instruction. Please schedule follow up visit in 3 weeks with pharmacy.

## 2017-01-15 NOTE — Progress Notes (Signed)
    S:    Chief Complaint  Patient presents with  . Medication Management    diabetes   Patient arrives in good spirits, ambulating without assistance. Presents for diabetes evaluation, education, and management at the request of Dr. [name]. Patient was referred on 01/09/17, when she was last seen by her Primary Care Provider.   Patient reports adherence with medications.  Current diabetes medications include: Lantus 42 units daily. Current hypertension medications include: losartan-HCTZ 100-25 mg daily.  Patient denies hypoglycemic events.  Patient reported dietary habits: Eats 3 meals/day Breakfast: Was eating cereal and oatmeal, but realized how much sugar she was eating so she is now eating turkey/ham sandwiches Lunch/Dinner: Greens, smoked Mal[place]urkey, cornbread Drinks: Water, diet soda (rarely); (she cut out regular soda)  Patient reported exercise habits: None - "not a priority"; works 12h shifts 5 days/week - stands at work   Patient reports nocturia x1/night Patient reports pain in feet at end of shift Patient denies visual changes. Improved from previous visits - was blurry when her BGs were super high - still needs to make an appointment Patient reports self foot exams. No issues.  O:  Physical Exam  Constitutional: She appears well-developed and well-nourished.   Review of Systems  All other systems reviewed and are negative.  Lab Results  Component Value Date   HGBA1C 14.5 01/09/2017   Vitals:   01/15/17 0946 01/15/17 1001  BP: (!) 143/84 (!) 142/80   Home fasting CBG: 220-230 normally (120 once after a long day of walking the day before - felt a little lightheaded at 120) (patient-reported)  10 year ASCVD risk: 12.2%.  A/P: Diabetes longstanding, currently uncontrolled despite use of basal insulin. Patient denies hypoglycemic events and is able to verbalize appropriate hypoglycemia management plan. Patient reports adherence with medication. Control is  suboptimal due to sedentary lifestyle and history of medication non-adherence. Advised patient to schedule an eye appointment and dentist appointments - discussed the importance of these appointments with the patient.  At last visit with Dr. [name] on 01/09/17, patient was advised to increase her Lantus dose of 36 units daily by increments of 2 units/day, until fasting glucose was under 150. Patient reported taking 42 units daily for the last couple of days. Continued basal insulin Lantus (insulin glargine) at 42 units daily, and told the patient to stop increasing by 2 units/day. Started Victoza (liraglutide) at 0.6 mg daily, titration to 1.2 mg daily in 1 week.  Patient educated on purpose, proper use and potential adverse effects of nausea.  Following instruction patient verbalized understanding of treatment plan.   Next A1C anticipated 03/2017.    ASCVD risk greater than 7.5%. Continued Rosuvastatin 20 mg daily.   Hypertension longstanding, currently controlled.  Patient reports adherence with medication. Continue losartan-HCTZ 100-25 mg daily.  Written patient instructions provided.  Total time in face to face counseling 30 minutes.   Follow up in Pharmacist Clinic Visit in 3 week.   Patient seen with Rip HarbourLiza Schimmelfing, PharmD Candidate, Verlan FriendsErin Deja, PharmD PGY-1 Resident, and Devota Pacearoline Welles, PharmD, PGY2 Resident.

## 2017-01-20 ENCOUNTER — Telehealth: Payer: Self-pay | Admitting: *Deleted

## 2017-01-20 DIAGNOSIS — E11649 Type 2 diabetes mellitus with hypoglycemia without coma: Secondary | ICD-10-CM

## 2017-01-20 MED ORDER — ONDANSETRON HCL 4 MG PO TABS: 4.0000 mg | ORAL_TABLET | Freq: Three times a day (TID) | ORAL | 0 refills | Status: DC | PRN

## 2017-01-20 NOTE — Progress Notes (Signed)
Patient ID: Virginia [name], female   DOB: 11/27/1970, 46 y.o.   MRN: [id number] Reviewed: Agree with Dr. Macky LowerKoval's documentation and management.

## 2017-01-20 NOTE — Telephone Encounter (Signed)
Pt lm on nurse line.  She "will not take the Victoza anymore" because it has her vomiting and with diarrhea all night.  She would like a call back for next steps.  Fleeger, Maryjo RochesterJessica Dawn, CMA

## 2017-01-20 NOTE — Telephone Encounter (Signed)
Called patient. She states that she is having very bad vomiting and diarrhea over the last couple days. She is only taking 0.6 mg of Victoza daily. Advised patient to stop taking Victoza. She has an appt with me and Dr. Raymondo BandKoval on 11/19. Encouraged her to keep this appt and we can discuss other options that her insurance would cover in order to bring down her A1C. In the meantime she will continue taking her insulin as prescribed and I have sent of a prescription for Zofran.   Anders Simmondshristina Shacora Zynda, MD [facility], PGY-3

## 2017-01-22 ENCOUNTER — Telehealth: Payer: Self-pay | Admitting: Pharmacist

## 2017-01-22 NOTE — Telephone Encounter (Signed)
Called pf/u on resolution of sx after d/c of victoza. No answer. Left HIPAA-compliant VM requesting she return my call.   Allena Katzaroline E Yamin Swingler, Pharm.D. PGY2 [facility] Resident Phone: [phone]

## 2017-02-02 ENCOUNTER — Ambulatory Visit: Payer: 59 | Admitting: Family Medicine

## 2017-02-02 ENCOUNTER — Encounter: Payer: Self-pay | Admitting: Family Medicine

## 2017-02-02 ENCOUNTER — Other Ambulatory Visit: Payer: Self-pay

## 2017-02-02 ENCOUNTER — Ambulatory Visit: Payer: 59 | Admitting: Pharmacist

## 2017-02-02 VITALS — BP 124/72 | HR 91 | Temp 98.4°F | Ht 67.0 in | Wt 239.8 lb

## 2017-02-02 DIAGNOSIS — E11649 Type 2 diabetes mellitus with hypoglycemia without coma: Secondary | ICD-10-CM

## 2017-02-02 MED ORDER — EMPAGLIFLOZIN 10 MG PO TABS: 10.0000 mg | ORAL_TABLET | Freq: Every day | ORAL | 1 refills | Status: DC

## 2017-02-02 NOTE — Progress Notes (Signed)
Subjective:    Patient ID: Virginia [name] , female   DOB: 01/22/1971 , [age] y.o..   MRN: [id number]  HPI  Virginia [name] is 46 yo F with PMH of T2DM, HTN, obesity here  for  Chief Complaint  Patient presents with  . Diabetes   1. Chronic Diabetes Patient notes she has tried Victoza, it was started on 11/1: It caused her to vomit and have profuse diarrhea. She stopped using it on 11/6. She is not interested in starting another medication like that for obvious reasons.  Disease Monitoring  Blood Sugar Ranges: Patient states her fasting glucoses have been 140-180s. She notes that when she was taking Victoza her glucoses were lower. She have 1 isolated glucose of 115. Dinner times have been in the low 200s and 1 time it was in the 300's after she ate some pasta.   Polyuria: no   Visual problems: still has not seen the eye doctor. Lost their phone number to schedule   Last hemoglobin A1C:  Lab Results  Component Value Date   HGBA1C 14.5 01/09/2017   Medication Compliance: yes  Medication Side Effects  Hypoglycemia: no   Preventitive Health Care  Eye Exam: overdue   Foot Exam: up to date   Diet pattern: compliant with diabetic diet   Exercise: continues to walk.   Review of Systems: Per HPI.   Past Medical History: Patient Active Problem List   Diagnosis Date Noted  . Hyperglycemia due to type 2 diabetes mellitus (HCC) 12/31/2016  . Complete rotator cuff tear of left shoulder 06/03/2013  . Rotator cuff tendinitis 03/25/2013  . Diabetic neuropathy (HCC) 09/28/2012  . CERVICAL POLYP 02/14/2010  . Hyperlipidemia 03/06/2008  . DM (diabetes mellitus), type 2, uncontrolled (HCC) 05/14/2006  . OBESITY, NOS 05/14/2006  . HYPERTENSION, BENIGN SYSTEMIC 05/14/2006   Medications: reviewed   Social Hx:  reports that she quit smoking about 18 years ago. Her smoking use included cigarettes. She started smoking about 25 years ago. She has a 1.75 pack-year smoking history. she has never used  smokeless tobacco.   Results for orders placed or performed in visit on 01/09/17  HgB A1c  Result Value Ref Range   Hemoglobin A1C 14.5       Objective:   BP 124/72   Pulse 91   Temp 98.4 F (36.9 C) (Oral)   Ht 5\' 7"  (1.702 m)   Wt 239 lb 12.8 oz (108.8 kg)   LMP 05/24/2016 (Approximate)   SpO2 98%   BMI 37.56 kg/m  Physical Exam  Gen: NAD, alert, cooperative with exam, well-appearing  Psych: good insight, normal mood and affect  Assessment & Plan:  DM (diabetes mellitus), type 2, uncontrolled (HCC) Diabetes uncontrolled with last hemoglobin A1c of 14.5 1 month ago.  Patient was seen by Virginia [name] and started on Victoza in November which she did not tolerate due to GI upset.  This was subsequently stopped only after 1 week of taking it.  Her fasting blood  have still been high in the 140-180 range.  Collaborative visit with Virginia [name] today and the plan is as follows; -Continue Lantus 42 units every morning -Start Jardiance 10 mg daily -Continue checking fasting blood glucose and glucose before bed -Protein creatinine ratio ordered today the patient left before this could be performed, will need to do at next visit - Follow-up in 1 month with Virginia [name] and in 2 months with Virginia [name]  Orders Placed This Encounter  Procedures  .  Protein / Creatinine Ratio, Urine   Meds ordered this encounter  Medications  . empagliflozin (JARDIANCE) 10 MG TABS tablet    Sig: Take 10 mg daily by mouth.    Dispense:  30 tablet    Refill:  1    Virginia Simmondshristina Louvina Cleary, MD [facility], PGY-3

## 2017-02-02 NOTE — Patient Instructions (Signed)
Thank you for coming in today, it was so nice to see you! Today we talked about:    Continue the same dose of insulin as you are on now  Start Jardiance 10 mg daily to help lower your sugar. It may make you urinate more often  If your sugar is below 80, eat some candy and please let us know as we may need to make an adjustment with your medication  Please follow up in 2 months with Dr. [name]. You can schedule this appointment at the front desk before you leave or call the clinic.  Bring in all your medications or supplements to each appointment for review.   If we ordered any tests today, you will be notified via telephone of any abnormalities. If everything is normal you will get a letter in the mail.   If you have any questions or concerns, please do not hesitate to call the office at [phone]. You can also message me directly via MyChart.   Sincerely,  Anders Simmondshristina Gambino, MD

## 2017-02-06 NOTE — Assessment & Plan Note (Signed)
Diabetes uncontrolled with last hemoglobin A1c of 14.5 1 month ago.  Patient was seen by Dr. Raymondo BandKoval and started on Victoza in November which she did not tolerate due to GI upset.  This was subsequently stopped only after 1 week of taking it.  Her fasting blood  have still been high in the 140-180 range.  Collaborative visit with Dr. Raymondo BandKoval today and the plan is as follows; -Continue Lantus 42 units every morning -Start Jardiance 10 mg daily -Continue checking fasting blood glucose and glucose before bed -Protein creatinine ratio ordered today the patient left before this could be performed, will need to do at next visit - Follow-up in 1 month with Dr. Raymondo BandKoval and in 2 months with [name]

## 2017-02-09 ENCOUNTER — Ambulatory Visit: Payer: 59 | Admitting: Pharmacist

## 2017-03-04 ENCOUNTER — Ambulatory Visit: Payer: 59 | Admitting: Podiatry

## 2017-03-04 ENCOUNTER — Encounter: Payer: Self-pay | Admitting: Podiatry

## 2017-03-04 ENCOUNTER — Ambulatory Visit (INDEPENDENT_AMBULATORY_CARE_PROVIDER_SITE_OTHER): Payer: Commercial Managed Care - HMO

## 2017-03-04 ENCOUNTER — Other Ambulatory Visit: Payer: Self-pay | Admitting: Podiatry

## 2017-03-04 DIAGNOSIS — M25571 Pain in right ankle and joints of right foot: Secondary | ICD-10-CM | POA: Diagnosis not present

## 2017-03-04 DIAGNOSIS — G8929 Other chronic pain: Secondary | ICD-10-CM

## 2017-03-04 DIAGNOSIS — M722 Plantar fascial fibromatosis: Secondary | ICD-10-CM | POA: Diagnosis not present

## 2017-03-04 MED ORDER — TRIAMCINOLONE ACETONIDE 10 MG/ML IJ SUSP
10.0000 mg | Freq: Once | INTRAMUSCULAR | Status: AC
  Administered 2017-03-04: 10 mg

## 2017-03-04 NOTE — Progress Notes (Signed)
Subjective:   Patient ID: Virginia NissenAva C [name], female   DOB: 46 y.o.   MRN: [id number]   HPI Patient presents stating the right heel has really started to hurt him again over the last few weeks and I did break my right ankle and I was out of work for several months   ROS      Objective:  Physical Exam  Neurovascular status intact with patient's right plantar heel being sore with mild edema in the right ankle with restriction of motion and mild discomfort when palpated     Assessment:  Acute plantar fasciitis right with ankle inflammation and mild instability right secondary to fracture of 7 months duration     Plan:  H&P and multiple view x-rays reviewed.  Today I injected the right plantar fascia 3 mg Kenalog 5 mg Xylocaine and instructed on physical therapy and supportive shoe gear usage.  Patient will be seen back for us to recheck and was encouraged to call with any questions and I did dispense ankle compression stocking  X-rays indicated that it appears the ankle is healing okay with plantar spurring noted no indication of stress fracture or advanced arthritis

## 2017-03-05 ENCOUNTER — Ambulatory Visit: Payer: 59 | Admitting: Pharmacist

## 2017-03-13 ENCOUNTER — Telehealth: Payer: Self-pay | Admitting: Family Medicine

## 2017-03-13 ENCOUNTER — Other Ambulatory Visit: Payer: Self-pay

## 2017-03-13 ENCOUNTER — Ambulatory Visit (HOSPITAL_COMMUNITY)
Admission: RE | Admit: 2017-03-13 | Discharge: 2017-03-13 | Disposition: A | Discharge status: Home / Self Care | Payer: 59 | Source: Ambulatory Visit | Attending: Family Medicine | Admitting: Family Medicine

## 2017-03-13 ENCOUNTER — Encounter: Payer: Self-pay | Admitting: Family Medicine

## 2017-03-13 ENCOUNTER — Ambulatory Visit: Payer: 59 | Admitting: Family Medicine

## 2017-03-13 VITALS — BP 122/70 | HR 90 | Temp 97.7°F | Ht 67.0 in | Wt 232.0 lb

## 2017-03-13 DIAGNOSIS — L819 Disorder of pigmentation, unspecified: Secondary | ICD-10-CM | POA: Insufficient documentation

## 2017-03-13 NOTE — Progress Notes (Signed)
    Subjective:  Virginia Virginia [name] is a 46 y.o. female who [facility] with a chief complaint of dark toes.   HPI:  Dark toes -Right foot 1st and 4th toes dark with black spots -Had injection by podiatry on right heel plantar fasciitis on 03/04/17.  On Saturday she was getting Virginia [name] toenails done and she was told that had some redness around the nail bed of Virginia [name].  States that Virginia [name] were red and warm and swollen earlier this week but all of that improved.  Now Virginia [name] are darker than the rest with a black spot on the top of Virginia [name] and both black around the right big toe nail bed. -She is an uncontrolled diabetic with neuropathy, states that may be some slight decrease in sensation compared to Virginia [name] usual but no real significant numbness -States the dark spots on Virginia [name] as a result of an ankle fracture in the past -No ulcer or drainage or pain.  No fever or chills  ROS: Per HPI  PMH: uncontrolled diabetes with neuropathy  Objective:  Physical Exam: BP 122/70   Pulse 90   Temp 97.7 F (36.5 C) (Oral)   Ht 5\' 7"  (1.702 m)   Wt 232 lb (105.2 kg)   LMP 03/05/2017   SpO2 98%   BMI 36.34 kg/m   Gen: NAD, resting comfortably R foot: R 1st and 4th toes dusky sharply at MTP and distally with dark spots as per picture below. 2+ DP and PT pulses palpated. Sensation intact to touch. No significant warmth or coldness. No ulcer or change in color on the plantar surfaces of toes or foot. No open wound on heel    Assessment/Plan:  Discoloration of skin of toe Given sharply demarcated dusky coloring of the first and fourth toes, concern for poor blood flow in distal toes although can feel pulse in dorsalis pedis area.  Patient scheduled for arterial Doppler this afternoon, we will follow-up on results and if no blood flow patient is aware that she may need to be admitted.  If vessels are patent she has an appointment with Virginia [name] PCP on  January 2 for recheck.    Virginia HerElsia J Jaren Kearn, DO PGY-2, [facility] 03/13/2017 11:55 AM

## 2017-03-13 NOTE — Assessment & Plan Note (Signed)
Given sharply demarcated dusky coloring of the first and fourth toes, concern for poor blood flow in distal toes although can feel pulse in dorsalis pedis area.  Patient scheduled for arterial Doppler this afternoon, we will follow-up on results and if no blood flow patient is aware that she may need to be admitted.  If vessels are patent she has an appointment with her PCP on January 2 for recheck.

## 2017-03-13 NOTE — Telephone Encounter (Signed)
No page received from vascular lab but prelim report viewed. Called patient to ensure that doppler results had been communicated to her. Patient voiced appreciation and confirmed that she would be at follow up appointment next week.

## 2017-03-13 NOTE — Progress Notes (Signed)
VASCULAR LAB PRELIMINARY  ARTERIAL  ABI completed:    RIGHT    LEFT    PRESSURE WAVEFORM  PRESSURE WAVEFORM  BRACHIAL 156 Tri BRACHIAL 158 Tri  DP -[place]- Tri DP -[place]- Tri  PT -[place]- Tri PT -[place]- Tri  GREAT TOE 137 NA GREAT TOE 124 NA    RIGHT LEFT  ABI -[place]- -[place]-   Resting ABI's are non-compressible bilaterally, with triphasic waveforms throughout.  Bilateral TBI's are within normal limits.  Attempted to page call back number for Dr. Artist PaisYoo- no return call as of 15:45.  Chauncey FischerCharlotte C Traveon Louro, RVT 03/13/2017, 3:45 PM

## 2017-03-13 NOTE — Patient Instructions (Signed)
Please get the arterial doppler, someone will call you with the results

## 2017-03-18 ENCOUNTER — Ambulatory Visit: Payer: Commercial Managed Care - HMO | Admitting: Podiatry

## 2017-03-19 ENCOUNTER — Ambulatory Visit: Payer: 59 | Admitting: Pharmacist

## 2017-03-19 ENCOUNTER — Encounter: Payer: Self-pay | Admitting: Pharmacist

## 2017-03-19 DIAGNOSIS — E782 Mixed hyperlipidemia: Secondary | ICD-10-CM | POA: Diagnosis not present

## 2017-03-19 DIAGNOSIS — E1149 Type 2 diabetes mellitus with other diabetic neurological complication: Secondary | ICD-10-CM | POA: Diagnosis not present

## 2017-03-19 DIAGNOSIS — E11649 Type 2 diabetes mellitus with hypoglycemia without coma: Secondary | ICD-10-CM | POA: Diagnosis not present

## 2017-03-19 MED ORDER — EMPAGLIFLOZIN 25 MG PO TABS: 25.0000 mg | ORAL_TABLET | Freq: Every day | ORAL | 5 refills | Status: DC

## 2017-03-19 MED ORDER — ATORVASTATIN CALCIUM 40 MG PO TABS: 40.0000 mg | ORAL_TABLET | Freq: Every day | ORAL | 3 refills | Status: DC

## 2017-03-19 MED ORDER — INSULIN GLARGINE 100 UNIT/ML SOLOSTAR PEN: 35.0000 [IU] | PEN_INJECTOR | Freq: Every morning | SUBCUTANEOUS | 11 refills | Status: DC

## 2017-03-19 MED ORDER — GABAPENTIN 300 MG PO CAPS: 600.0000 mg | ORAL_CAPSULE | Freq: Two times a day (BID) | ORAL | 3 refills | Status: DC

## 2017-03-19 NOTE — Assessment & Plan Note (Signed)
Diabetes longstanding currently with improved control. Patient denies hypoglycemic events and is able to verbalize appropriate hypoglycemia management plan. Patient reports adherence with diabetes medication. Control is suboptimal due to sedentary lifestyle, body habitus, shift-work sleep/eating cycle. Tolerating Jardiance well, verbalizes understanding of sick-day rules for management of Jardiance. -Decrease Lantus to 35 units once daily -Increase Jardiance to 25 mg daily - patient to finish current supply of 10 mg tablets and then start on 25 mg tablets. -Added victoza to intolerance list  Next A1C anticipated at next visit

## 2017-03-19 NOTE — Assessment & Plan Note (Signed)
Diabetic neuropathy longstanding, uncontrolled on gabapentin 300 mg once daily before bed per patient report. CrCl (normalized given patient's weight) ~78 ml/min -Increase gabapentin to 600 mg twice daily. Counseled on sedation risk and asked her to stop first-of-the-day doses and call if she notices this.

## 2017-03-19 NOTE — Progress Notes (Signed)
Patient ID: Virginia [name], female   DOB: 01/07/1971, 46 y.o.   MRN: [id number] Reviewed: Agree with Dr. Koval's documentation and management. 

## 2017-03-19 NOTE — Assessment & Plan Note (Signed)
ASCVD risk greater than 7.5%. Last lipid panel 02/18/2016 with LDL >70 mg/dL. Intolerant to rosuvastatin (myalgias). Ideally, patient should be on statin medication - has h/o atorvastatin use and does not remember any intolerances.  -D/c rosuvastatin and added to intolerance list -Start atorvastatin 40 mg daily

## 2017-03-19 NOTE — Patient Instructions (Addendum)
Great to see you today!  Your blood sugars are much better with the jardiance (empagliflozin).   Please take your remaining pills until gone then increase your Jardiance dose to 25mg  with the new prescription.   Reduce your Lantus dose to 35 units once daily.   Take Gabapentin 2 pills in the AM and 2 pills at bedtime.   Continue to work hard on your exercise plan.   Atorvastatin 40mg  once daily.   Next visit with Dr. [name] in 1 month.

## 2017-03-19 NOTE — Progress Notes (Signed)
S:     Chief Complaint  Patient presents with  . Medication Management    diabetes    Patient arrives in good spirits, ambulating without assistance accompanied by her daughter "GrenadaBrittany".  Presents for diabetes evaluation, education, and management follow-up.  Patient was last seen by Primary Care Provider on 02/02/2017. Since last visit, patient had severe intolerance to victoza (projectile vomiting) - resolved after d/c - and Jardiance 10 mg once daily was started by PCP. Additionally, she has been evaluated for discoloration in toes - improving per patient report.  Patient reports she has been doing well. Tolerating Jardiance well, denies any urinary sx or other complaints. She does report that she self d/c'd rosuvastatin 2/2 myalgias. Myalgias improved since then. Has taken atorvastatin in the past and does not remember any issues or intolerances with this. Does complain of persistent neuropathy and does NOT think gabapentin is helping at this time.   Reports CBGs have been improved, brings CBG meter for review. Expresses confidence in management of hypoglycemic episodes. Uses pill box to keep organized.  Family/Social History: works at [employer], long hours on feet all day, works third shift.  Patient reports adherence with medications except atorvastatin.  Current diabetes medications include: Jardiance 10 mg daily, Lantus 42 units daily Current hypertension medications include: losartan-HCTZ 100-25 mg daily  Patient denies hypoglycemic events. Reports symptoms in the 70s (dizziness)  Patient reported exercise habits: none at present, wants to get back to walking  O:  Physical Exam  Constitutional: She appears well-developed and well-nourished.     Review of Systems  Gastrointestinal: Negative for nausea and vomiting.  Genitourinary: Negative for dysuria, frequency and urgency.  Musculoskeletal: Negative for myalgias.     Lab Results  Component Value Date   HGBA1C 14.5 01/09/2017   Vitals:   03/19/17 1005  BP: 122/80  Pulse: 90  SpO2: 99%    Home fasting CBG: 100s-140s, excursions to 70s 2 hour post-prandial/random CBG: 190s or lower, some excursions to low 200s  10 year ASCVD risk: 12.2%.  A/P: Diabetes longstanding currently with improved control. Patient denies hypoglycemic events and is able to verbalize appropriate hypoglycemia management plan. Patient reports adherence with diabetes medication. Control is suboptimal due to sedentary lifestyle, body habitus, shift-work sleep/eating cycle. Tolerating Jardiance well, verbalizes understanding of sick-day rules for management of Jardiance. -Decrease Lantus to 35 units once daily -Increase Jardiance to 25 mg daily - patient to finish current supply of 10 mg tablets and then start on 25 mg tablets. -Added victoza to intolerance list  Next A1C anticipated at next visit    ASCVD risk greater than 7.5%. Last lipid panel 02/18/2016 with LDL >70 mg/dL. Intolerant to rosuvastatin (myalgias). Ideally, patient should be on statin medication - has h/o atorvastatin use and does not remember any intolerances.  -D/c rosuvastatin and added to intolerance list -Start atorvastatin 40 mg daily  Diabetic neuropathy longstanding, uncontrolled on gabapentin 300 mg once daily before bed per patient report. CrCl (normalized given patient's weight) ~78 ml/min -Increase gabapentin to 600 mg twice daily. Counseled on sedation risk and asked her to stop first-of-the-day doses and call if she notices this.   Hypertension longstanding currently controlled.  Patient reports adherence with medication.  -Continue current medications  Written patient instructions provided.  Total time in face to face counseling 25 minutes.   Follow up with PCP in 1 month.   Patient seen with Remus LofflerGloria Adedoyin, PharmD Candidate and Devota Pacearoline Welles, PGY2 Pharmacy Resident, PharmD,  BCPS

## 2017-03-20 ENCOUNTER — Ambulatory Visit: Payer: Commercial Managed Care - HMO | Admitting: Podiatry

## 2017-03-20 ENCOUNTER — Other Ambulatory Visit: Payer: Self-pay | Admitting: Student

## 2017-03-20 ENCOUNTER — Encounter: Payer: Self-pay | Admitting: Podiatry

## 2017-03-20 DIAGNOSIS — R202 Paresthesia of skin: Secondary | ICD-10-CM

## 2017-03-20 DIAGNOSIS — R200 Anesthesia of skin: Secondary | ICD-10-CM

## 2017-03-20 DIAGNOSIS — M722 Plantar fascial fibromatosis: Secondary | ICD-10-CM | POA: Diagnosis not present

## 2017-03-20 NOTE — Progress Notes (Signed)
Subjective:   Patient ID: Virginia [name], female   DOB: 47 y.o.   MRN: [id number]   HPI Patient presents with discoloration in the right hallux and fourth toe and does not remember specific trauma and states that she did have vascular studies done which were normal   ROS      Objective:  Physical Exam  Neurovascular status intact with crusted tissue on the dorsum of the right fourth toe with discoloration of the hallux and fourth toe noted     Assessment:  Probability that this is a vascular condition creating this irritative process     Plan:  H&P condition reviewed recommended soaks therapy and wider shoes along with cushion padding type treatment.  Patient will be seen back for us to recheck again and was given strict instructions if any breakdown or ulceration were to occur to be seen immediately

## 2017-04-23 ENCOUNTER — Other Ambulatory Visit: Payer: Self-pay

## 2017-04-23 DIAGNOSIS — I10 Essential (primary) hypertension: Secondary | ICD-10-CM

## 2017-04-23 MED ORDER — LOSARTAN POTASSIUM 100 MG PO TABS
100.0000 mg | ORAL_TABLET | Freq: Every day | ORAL | 0 refills | Status: DC
Start: 2017-04-23 — End: 2017-12-21

## 2017-04-23 MED ORDER — HYDROCHLOROTHIAZIDE 25 MG PO TABS
25.0000 mg | ORAL_TABLET | Freq: Every day | ORAL | 0 refills | Status: DC
Start: 2017-04-23 — End: 2017-07-14

## 2017-04-23 NOTE — Telephone Encounter (Signed)
Losartan/HCTZ 100-25mg  is on back order until June. Would you split into 2 Rx's?

## 2017-04-23 NOTE — Telephone Encounter (Signed)
Yes we can split the 2 prescriptions.  Prescription sent for losartan 100 mg daily and HCTZ 25 mg daily.  Thank you.  Anders Simmondshristina Anahlia Iseminger, MD [facility], PGY-3

## 2017-05-26 ENCOUNTER — Ambulatory Visit: Payer: 59 | Admitting: Student

## 2017-06-25 ENCOUNTER — Ambulatory Visit: Payer: 59 | Admitting: Podiatry

## 2017-07-01 ENCOUNTER — Ambulatory Visit: Payer: 59 | Admitting: Family Medicine

## 2017-07-14 ENCOUNTER — Other Ambulatory Visit: Payer: Self-pay

## 2017-07-14 MED ORDER — HYDROCHLOROTHIAZIDE 25 MG PO TABS: 25.0000 mg | ORAL_TABLET | Freq: Every day | ORAL | 0 refills | Status: DC

## 2017-08-03 ENCOUNTER — Ambulatory Visit: Payer: 59 | Admitting: Family Medicine

## 2017-08-03 VITALS — BP 130/85 | HR 87 | Temp 98.3°F | Wt 221.6 lb

## 2017-08-03 DIAGNOSIS — E1165 Type 2 diabetes mellitus with hyperglycemia: Secondary | ICD-10-CM | POA: Diagnosis not present

## 2017-08-03 DIAGNOSIS — E11628 Type 2 diabetes mellitus with other skin complications: Secondary | ICD-10-CM | POA: Diagnosis not present

## 2017-08-03 DIAGNOSIS — L819 Disorder of pigmentation, unspecified: Secondary | ICD-10-CM

## 2017-08-03 DIAGNOSIS — R252 Cramp and spasm: Secondary | ICD-10-CM | POA: Diagnosis not present

## 2017-08-03 DIAGNOSIS — I10 Essential (primary) hypertension: Secondary | ICD-10-CM

## 2017-08-03 LAB — POCT GLYCOSYLATED HEMOGLOBIN (HGB A1C): HBA1C, POC (CONTROLLED DIABETIC RANGE): 10.2 % — AB (ref 0.0–7.0)

## 2017-08-03 NOTE — Patient Instructions (Signed)
Thank you for coming in today, it was so nice to see you! Today we talked about:    Discoloration on feet: This is likely from high sugars from uncontrolled diabetes.  Continue to check your feet every day for any open sores.  If anything is open and bleeding please come back and see [place].  I have placed a referral for a podiatry doctor, someone should call you within the next couple weeks to get this scheduled  Muscle cramps: We are checking your labs today to see if anything is abnormal  Please follow up in 1-2 weeks for diabetes. You can schedule this appointment at the front desk before you leave or call the clinic.  Bring in all your medications or supplements to each appointment for review.   If we ordered any tests today, you will be notified via telephone of any abnormalities. If everything is normal you will get a letter in the mail.   If you have any questions or concerns, please do not hesitate to call the office at [phone]. You can also message me directly via MyChart.   Sincerely,  Anders Simmonds, MD

## 2017-08-03 NOTE — Progress Notes (Signed)
   Subjective:    Patient ID: Virginia [name] , female   DOB: 20-Jan-1971 , 47 y.o..   MRN: [id number]  HPI  Virginia [name] is a 47 year old female with uncontrolled type 2 diabetes, hypertension, hyperlipidemia here for  Chief Complaint  Patient presents with  . Discoloration on feet    1. "Black spots on feet": Patient notes that for the last 3 months she has had dark discoloration of different spots on her right and left feet.  She notes that the spots are mostly on the tops of her feet and around her ankles.  Of note she does have first and fourth digit hyperpigmentation in her right foot that has been present since December, she went to the ED at that time and DVT was ruled out.  She also notes that she has not been having some foot cramps, [facility] and was told to wear compression stockings.  She notes that this did not help at all.  She denies any pain in this area but does admit to numbness and tingling.  Of note she has a history of numbness and tingling in her lower extremities.  She has diabetes but has not been checking her glucoses.  She notes that she has been taking her Lantus and Jardiance as prescribed.  She also notes that she recently got new work boots.  Review of Systems: Per HPI.   Medications: reviewed   Social Hx:  reports that she quit smoking about 19 years ago. Her smoking use included cigarettes. She started smoking about 26 years ago. She has a 1.75 pack-year smoking history. She has never used smokeless tobacco.   Objective:   BP 130/85 (BP Location: Right Arm, Patient Position: Sitting, Cuff Size: Normal)   Pulse 87   Temp 98.3 F (36.8 C) (Oral)   Wt 221 lb 9.6 oz (100.5 kg)   SpO2 97%   BMI 35.77 kg/m  Physical Exam  Gen: NAD, alert, cooperative with exam, well-appearing Right foot: Bandlike area of hyperpigmentation across right ankle, hyperpigmentation across the dorsum of foot, 1 inch in circumference area of hyperpigmentation  above medial malleolus, hyperpigmentation of fourth digit.  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  Although diabetic dermopathy typically seen on the shins.  She has 2+ DP pulses bilaterally and sensation is grossly intact throughout.  Very unlikely that this is DVT or necrosis. - We will need to get her diabetes under better control, she has not had an A1c check in several months.  Will check A1c today and have her follow-up in 1 week to discuss in more detail - Continue compression stockings -Advised for comfortable fitting shoes, new work boots likely creating skin changes around the ankles.  Advised her to wear shoes that do not rub against her skin -Referral to podiatry, would like any diabetic shoes  -CBC, BMP, TSH ordered for leg cramping, will discuss at next visit as well   Anders Simmonds, MD [facility], PGY-3

## 2017-08-04 ENCOUNTER — Encounter: Payer: Self-pay | Admitting: Family Medicine

## 2017-08-04 DIAGNOSIS — E11628 Type 2 diabetes mellitus with other skin complications: Secondary | ICD-10-CM | POA: Insufficient documentation

## 2017-08-04 LAB — CBC
HEMOGLOBIN: 12.6 g/dL (ref 11.1–15.9)
Hematocrit: 39.3 % (ref 34.0–46.6)
MCH: 24.7 pg — AB (ref 26.6–33.0)
MCHC: 32.1 g/dL (ref 31.5–35.7)
MCV: 77 fL — ABNORMAL LOW (ref 79–97)
PLATELETS: 270 10*3/uL (ref 150–450)
RBC: 5.1 x10E6/uL (ref 3.77–5.28)
RDW: 14.3 % (ref 12.3–15.4)
WBC: 7.7 10*3/uL (ref 3.4–10.8)

## 2017-08-04 LAB — BASIC METABOLIC PANEL
BUN/Creatinine Ratio: 27 — ABNORMAL HIGH (ref 9–23)
BUN: 23 mg/dL (ref 6–24)
CHLORIDE: 99 mmol/L (ref 96–106)
CO2: 25 mmol/L (ref 20–29)
Calcium: 9.8 mg/dL (ref 8.7–10.2)
Creatinine, Ser: 0.84 mg/dL (ref 0.57–1.00)
GFR calc Af Amer: 96 mL/min/{1.73_m2} (ref >59)
GFR, EST NON AFRICAN AMERICAN: 83 mL/min/{1.73_m2} (ref >59)
GLUCOSE: 134 mg/dL — AB (ref 65–99)
POTASSIUM: 3.4 mmol/L — AB (ref 3.5–5.2)
SODIUM: 140 mmol/L (ref 134–144)

## 2017-08-04 LAB — TSH: TSH: 0.646 u[IU]/mL (ref 0.450–4.500)

## 2017-08-04 NOTE — Assessment & Plan Note (Addendum)
Hyperpigmentation on feet likely secondary to diabetic dermopathy.  Although diabetic dermopathy typically seen on the shins.  She has 2+ DP pulses bilaterally and sensation is grossly intact throughout.  Very unlikely that this is DVT or necrosis. - We will need to get her diabetes under better control, she has not had an A1c check in several months.  Will check A1c today and have her follow-up in 1 week to discuss in more detail - Continue compression stockings -Advised for comfortable fitting shoes, new work boots likely creating skin changes around the ankles.  Advised her to wear shoes that do not rub against her skin -Referral to podiatry, would like any diabetic shoes  -CBC, BMP, TSH ordered for leg cramping, will discuss at next visit as well

## 2017-08-12 NOTE — Progress Notes (Signed)
Subjective:    Patient ID: Virginia [name] , female   DOB: 02-11-1971 , 47 y.o..   MRN: [id number]  HPI  Virginia [name] is a 47 year old female with PMH of uncontrolled type 2 diabetes, hypertension, hyperlipidemia here for  Chief Complaint  Patient presents with  . Follow-up    1. Chronic Diabetes  Disease Monitoring  Blood Sugar Ranges: Fasting blood glucoses have been around the 150s to 160s  Polyuria: no   Visual problems: yes, wears glasses.  Does have an ophthalmologist that she follows with  Last hemoglobin A1C:  Lab Results  Component Value Date   HGBA1C 10.2 (A) 08/03/2017   Medication Compliance: yes .  Has been taking Jardiance 25 mg daily and Lantus 25 units daily Medication Side Effects  Hypoglycemia: no   Preventitive Health Care  Eye Exam: Overdue, she will call her eye doctor  Foot Exam: Up-to-date, [facility]  Diet pattern: Has been doing much better and her diet has decreased sugary beverages and carbs  Exercise: Minimal but she does walk a lot at her job   Review of Systems: Per HPI. All other systems reviewed and are negative.  Past Medical History: Patient Active Problem List   Diagnosis Date Noted  . 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 Discoloration of skin of toe 03/13/2017  . Hyperglycemia due to type 2 diabetes mellitus ([place]) 12/31/2016  . Complete rotator cuff tear of left shoulder 06/03/2013  . Rotator cuff tendinitis 03/25/2013  . Diabetic neuropathy ([place]) 09/28/2012  . CERVICAL POLYP 02/14/2010  . Hyperlipidemia 03/06/2008  . DM (diabetes mellitus), type 2, uncontrolled ([place]) 05/14/2006  . OBESITY, NOS 05/14/2006  . HYPERTENSION, BENIGN SYSTEMIC 05/14/2006    Medications: reviewed and updated Current Outpatient Medications  Medication Sig Dispense Refill  . atorvastatin (LIPITOR) 40 MG tablet Take 1 tablet (40 mg total) by mouth daily. 30  tablet 3  . Blood Glucose Monitoring Suppl (ONETOUCH VERIO) w/Device KIT 1 kit by Does not apply route daily. 1 kit 1  . empagliflozin (JARDIANCE) 25 MG TABS tablet Take 25 mg by mouth daily. 30 tablet 5  . gabapentin (NEURONTIN) 300 MG capsule Take 2 capsules (600 mg total) by mouth 2 (two) times daily. 120 capsule 3  . glucose blood (ONETOUCH VERIO) test strip Use 3 times daily 100 each 12  . hydrochlorothiazide (HYDRODIURIL) 25 MG tablet Take 1 tablet (25 mg total) by mouth daily. 90 tablet 0  . Insulin Glargine (LANTUS) 100 UNIT/ML Solostar Pen Inject 35 Units into the skin every morning. 15 mL 11  . Insulin Pen Needle 31G X 5 MM MISC Use one needle per application 001 each 6  . losartan (COZAAR) 100 MG tablet Take 1 tablet (100 mg total) by mouth daily. 90 tablet 0  . Multiple Vitamins-Minerals (MULTIVITAMIN WITH MINERALS) tablet Take 1 tablet by mouth daily. Reported on 07/09/2015    . omeprazole (PRILOSEC) 20 MG capsule Take 1 capsule (20 mg total) by mouth 2 (two) times daily before a meal. 28 capsule 0  . ONETOUCH DELICA LANCETS FINE MISC 1 each by Does not apply route 2 (two) times daily. 100 each 5   No current facility-administered medications for this visit.     Social Hx:  reports that she quit smoking about 19 years ago. Her smoking use included cigarettes. She started smoking about 26 years ago. She has a 1.75 pack-year  smoking history. She has never used smokeless tobacco.   Objective:   BP 132/88   Pulse 93   Temp 97.6 F (36.4 C) (Oral)   Wt 224 lb 3.2 oz (101.7 kg)   SpO2 98%   BMI 36.19 kg/m  Physical Exam  Gen: NAD, alert, cooperative with exam, well-appearing Psych: good insight, normal mood and affect  Assessment & Plan:  DM (diabetes mellitus), type 2, uncontrolled ([place]) Uncontrolled per last A1c of 10.2.  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  She has been taking her medications as  prescribed and checking her glucoses.  She was supposed to be on 35 units Lantus daily but has only been taking 25. -Continue Lantus 25 units daily, can go up 1 unit every day as long as glucose is above 150 -Continue Jardiance 25 mg daily - Return in 3 months for next A1c check - She will call her ophthalmologist to schedule diabetic eye exam -She will call back the podiatry office to schedule a visit  Smitty Cords, MD [facility], PGY-3

## 2017-08-13 ENCOUNTER — Other Ambulatory Visit: Payer: Self-pay

## 2017-08-13 ENCOUNTER — Ambulatory Visit: Payer: 59 | Admitting: Family Medicine

## 2017-08-13 VITALS — BP 132/88 | HR 93 | Temp 97.6°F | Wt 224.2 lb

## 2017-08-13 DIAGNOSIS — E1165 Type 2 diabetes mellitus with hyperglycemia: Secondary | ICD-10-CM

## 2017-08-13 NOTE — Assessment & Plan Note (Signed)
Uncontrolled per last A1c of 10.2.  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  She has been taking her medications as prescribed and checking her glucoses.  She was supposed to be on 35 units Lantus daily but has only been taking 25. -Continue Lantus 25 units daily, can go up 1 unit every day as long as glucose is above 150 -Continue Jardiance 25 mg daily - Return in 3 months for next A1c check - She will call her ophthalmologist to schedule diabetic eye exam -She will call back the podiatry office to schedule a visit

## 2017-08-13 NOTE — Patient Instructions (Addendum)
Thank you for coming in today, it was so nice to see you! Today we talked about:    Diabetes: You are doing a great job of taking your medicine.  Continue to take the Jardiance as prescribed.  You can continue to take 25 units of Lantus every day.  Go up 1 unit of Lantus daily if your fasting glucose is above 150.   You will need to have a diabetic eye exam by your eye doctor  You will need to schedule the appointment with the foot doctor  You are due for a cholesterol check and a Pap smear, we can do this in the future  Try to walk for 10 minutes at least 3 times a week  [place] job but decreasing sugar from your diet, continue to drink only water and sugar-free beverages  Please follow up in late August for a diabetes visit with A1C check. You can schedule this appointment at the front desk before you leave or call the clinic.  Bring in all your medications or supplements to each appointment for review.   If you have any questions or concerns, please do not hesitate to call the office at [phone]. You can also message me directly via MyChart.   Sincerely,  Anders Simmonds, MD

## 2017-10-07 ENCOUNTER — Encounter: Payer: Self-pay | Admitting: Family Medicine

## 2017-10-07 ENCOUNTER — Other Ambulatory Visit: Payer: Self-pay

## 2017-10-07 ENCOUNTER — Ambulatory Visit: Payer: 59 | Admitting: Family Medicine

## 2017-10-07 VITALS — BP 122/64 | HR 83 | Temp 97.6°F | Ht 66.0 in | Wt 223.2 lb

## 2017-10-07 DIAGNOSIS — R42 Dizziness and giddiness: Secondary | ICD-10-CM

## 2017-10-07 DIAGNOSIS — H8123 Vestibular neuronitis, bilateral: Secondary | ICD-10-CM

## 2017-10-07 MED ORDER — MECLIZINE HCL 25 MG PO TABS: 25.0000 mg | ORAL_TABLET | Freq: Three times a day (TID) | ORAL | 0 refills | Status: DC | PRN

## 2017-10-07 NOTE — Progress Notes (Signed)
   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 During her shift, began feeling a little dizzy, went on break, ate a little popcorn and diet pepsi, but still felt dizzy. Tried 2 pieces of hard candy thinking maybe her sugar was low. Felt better after this and sitting down. Felt a heat wave hit her and dizzy again, clammy feeling. Co worker said she "didn't look good." felt everything around her was moving. Went to medical office at work, BP was a little high, CBG couldn't be checked due to battery issues. CBG at home was 255. This is near her normal CBG (DM poorly controlled). No CP, SOB, weakness. Did have HA with this - felt central frontal and felt like she was sick, somewhat nauseated yesterday. Had something similar in the past when she took losartan and changed meds then. This did feel like vertigo that she has had previously but also feels dehydrated. +polyuria, no dysuria. No LE swelling. No new OTCs. Been taking some ibuprofen. Took one acid reducer last night. Feeling is not constant, but present with moving and particularly rapidly changing position from sitting to lying. No preceeding URI.   ROS: Denies CP, SOB, abdominal pain, dysuria, changes in BMs.   CC, SH/smoking status, and VS noted  Objective: BP 122/64   Pulse 83   Temp 97.6 F (36.4 C) (Oral)   Ht 5\' 6"  (1.676 m)   Wt 223 lb 3.2 oz (101.2 kg)   SpO2 92%   BMI 36.03 kg/m  Gen: NAD, alert, cooperative, and pleasant. HEENT: NCAT, EOMI, PERRL CV: RRR, no murmur Resp: CTAB, no wheezes, non-labored Abd: SNTND, BS present, no guarding or organomegaly Ext: No edema, warm Neuro: Alert and oriented, Speech clear, No gross deficits. dix hallpike + for provoking symptoms, no nystagmus. rhomberg negative. FNF and heel to shin normal.   Assessment and plan:  Dizziness: Etiology likely peripheral given normal neuro exam and negative Romberg with good cerebellar testing.   Patient has a history of vertigo and this feels similar, although she states she feels dehydrated.  I will check electrolytes and hemoglobin for derangements causing dizziness.  Orthostatics without significant change although blood pressure systolic in the 90s.  Asked her to hold her losartan until I return her call with labs.  Also will trial meclizine.  May consider vestibular PT if prolonged symptoms. [facility] if worsening dizziness or any new symptoms such as weakness.   Orders Placed This Encounter  Procedures  . CBC  . Basic metabolic panel    Meds ordered this encounter  Medications  . meclizine (ANTIVERT) 25 MG tablet    Sig: Take 1 tablet (25 mg total) by mouth 3 (three) times daily as needed for dizziness.    Dispense:  30 tablet    Refill:  0     Loni MuseKate Enola Siebers, MD, PGY3 10/09/2017 7:26 AM

## 2017-10-07 NOTE — Patient Instructions (Signed)
It was a pleasure to see you today! Thank you for choosing [facility] for your primary care. Virginia [name] was seen for dizziness.   Our plans for today were:  Please hold your losartan until I call tomorrow with lab results. Try to drink more water.   Try the vertigo medicine. Call me if this doesn't help.   Best,  Dr. Chanetta Marshallimberlake

## 2017-10-08 LAB — BASIC METABOLIC PANEL
BUN / CREAT RATIO: 25 — AB (ref 9–23)
BUN: 24 mg/dL (ref 6–24)
CALCIUM: 9.5 mg/dL (ref 8.7–10.2)
CO2: 24 mmol/L (ref 20–29)
Chloride: 97 mmol/L (ref 96–106)
Creatinine, Ser: 0.97 mg/dL (ref 0.57–1.00)
GFR, EST AFRICAN AMERICAN: 80 mL/min/{1.73_m2} (ref >59)
GFR, EST NON AFRICAN AMERICAN: 70 mL/min/{1.73_m2} (ref >59)
Glucose: 138 mg/dL — ABNORMAL HIGH (ref 65–99)
POTASSIUM: 3.4 mmol/L — AB (ref 3.5–5.2)
Sodium: 139 mmol/L (ref 134–144)

## 2017-10-08 LAB — CBC
Hematocrit: 37.8 % (ref 34.0–46.6)
Hemoglobin: 12.4 g/dL (ref 11.1–15.9)
MCH: 24.2 pg — ABNORMAL LOW (ref 26.6–33.0)
MCHC: 32.8 g/dL (ref 31.5–35.7)
MCV: 74 fL — ABNORMAL LOW (ref 79–97)
Platelets: 234 10*3/uL (ref 150–450)
RBC: 5.12 x10E6/uL (ref 3.77–5.28)
RDW: 15.3 % (ref 12.3–15.4)
WBC: 6.6 10*3/uL (ref 3.4–10.8)

## 2017-10-17 ENCOUNTER — Other Ambulatory Visit: Payer: Self-pay | Admitting: Family Medicine

## 2017-10-17 DIAGNOSIS — E782 Mixed hyperlipidemia: Secondary | ICD-10-CM

## 2017-10-28 DIAGNOSIS — H04123 Dry eye syndrome of bilateral lacrimal glands: Secondary | ICD-10-CM | POA: Diagnosis not present

## 2017-10-28 DIAGNOSIS — H40033 Anatomical narrow angle, bilateral: Secondary | ICD-10-CM | POA: Diagnosis not present

## 2017-11-04 ENCOUNTER — Ambulatory Visit (INDEPENDENT_AMBULATORY_CARE_PROVIDER_SITE_OTHER): Payer: 59

## 2017-11-04 ENCOUNTER — Encounter: Payer: Self-pay | Admitting: Podiatry

## 2017-11-04 ENCOUNTER — Ambulatory Visit: Payer: 59 | Admitting: Podiatry

## 2017-11-04 DIAGNOSIS — R202 Paresthesia of skin: Secondary | ICD-10-CM

## 2017-11-04 DIAGNOSIS — M722 Plantar fascial fibromatosis: Secondary | ICD-10-CM

## 2017-11-04 DIAGNOSIS — R200 Anesthesia of skin: Secondary | ICD-10-CM | POA: Diagnosis not present

## 2017-11-04 MED ORDER — TRIAMCINOLONE ACETONIDE 10 MG/ML IJ SUSP
10.0000 mg | Freq: Once | INTRAMUSCULAR | Status: AC
  Administered 2017-11-04: 10 mg

## 2017-11-04 NOTE — Patient Instructions (Signed)

## 2017-11-04 NOTE — Progress Notes (Signed)
Subjective:   Patient ID: Virginia NissenAva C [name], female   DOB: 47 y.o.   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   ROS      Objective:  Physical Exam  Neurovascular status found to be intact muscle strength is adequate range of motion within normal limits with patient noted to have inflammation plantar heel region bilateral with fluid buildup around the medial band bilateral moderate depression of the arch also noted     Assessment:  Acute plantar fasciitis bilateral with possibility for low-grade vascular disease also noted but no significant systemic symptoms     Plan:  H&P discussed plantar fasciitis and also vascular issues and we will continue to watch that carefully.  I injected the plantar fascial bilateral 3 mg Kenalog 5 mg Xylocaine applied fascial brace bilateral lift of the arch is and scanned for customized orthotics that will be made of a [place] type with deep heel seat in order to try to cover the heel and provide arch support with cushion.  Reappoint when ready  X-ray indicates there is small spur formation with no indications of stress fracture or advanced arthritis

## 2017-11-26 ENCOUNTER — Other Ambulatory Visit: Payer: 59 | Admitting: Orthotics

## 2017-12-06 ENCOUNTER — Other Ambulatory Visit: Payer: Self-pay | Admitting: Family Medicine

## 2017-12-07 NOTE — Telephone Encounter (Signed)
Needs diabetes follow up appointment

## 2017-12-07 NOTE — Telephone Encounter (Signed)
LM for patient ok per DPR to call back and schedule appt for blood pressure and diabetes follow up. Jazmin Hartsell,CMA

## 2017-12-21 ENCOUNTER — Other Ambulatory Visit: Payer: Self-pay

## 2017-12-21 ENCOUNTER — Encounter: Payer: Self-pay | Admitting: Family Medicine

## 2017-12-21 ENCOUNTER — Ambulatory Visit: Payer: 59 | Admitting: Family Medicine

## 2017-12-21 VITALS — BP 138/84 | HR 91 | Temp 97.9°F | Wt 228.6 lb

## 2017-12-21 DIAGNOSIS — M722 Plantar fascial fibromatosis: Secondary | ICD-10-CM | POA: Diagnosis not present

## 2017-12-21 DIAGNOSIS — E1165 Type 2 diabetes mellitus with hyperglycemia: Secondary | ICD-10-CM | POA: Diagnosis not present

## 2017-12-21 DIAGNOSIS — E11649 Type 2 diabetes mellitus with hypoglycemia without coma: Secondary | ICD-10-CM | POA: Diagnosis not present

## 2017-12-21 DIAGNOSIS — I10 Essential (primary) hypertension: Secondary | ICD-10-CM | POA: Diagnosis not present

## 2017-12-21 DIAGNOSIS — B353 Tinea pedis: Secondary | ICD-10-CM | POA: Insufficient documentation

## 2017-12-21 LAB — POCT GLYCOSYLATED HEMOGLOBIN (HGB A1C): HBA1C, POC (CONTROLLED DIABETIC RANGE): 10.4 % — AB (ref 0.0–7.0)

## 2017-12-21 MED ORDER — GLUCOSE BLOOD VI STRP: ORAL_STRIP | 12 refills | Status: DC

## 2017-12-21 MED ORDER — ONETOUCH DELICA LANCETS FINE MISC: 1.0000 | Freq: Two times a day (BID) | 5 refills | Status: DC

## 2017-12-21 MED ORDER — CLOTRIMAZOLE 1 % EX CREA: 1.0000 "application " | TOPICAL_CREAM | Freq: Two times a day (BID) | CUTANEOUS | 0 refills | Status: DC

## 2017-12-21 MED ORDER — INSULIN PEN NEEDLE 31G X 5 MM MISC: 6 refills | Status: DC

## 2017-12-21 MED ORDER — HYDROCHLOROTHIAZIDE 25 MG PO TABS: 25.0000 mg | ORAL_TABLET | Freq: Every day | ORAL | 3 refills | Status: DC

## 2017-12-21 MED ORDER — ALCOHOL PADS 70 % PADS: 1.0000 | MEDICATED_PAD | 3 refills | Status: DC

## 2017-12-21 MED ORDER — EMPAGLIFLOZIN 25 MG PO TABS: 30.0000 mg | ORAL_TABLET | Freq: Every day | ORAL | 5 refills | Status: DC

## 2017-12-21 MED ORDER — EMPAGLIFLOZIN 25 MG PO TABS: 25.0000 mg | ORAL_TABLET | Freq: Every day | ORAL | 5 refills | Status: DC

## 2017-12-21 MED ORDER — IRBESARTAN 75 MG PO TABS: 75.0000 mg | ORAL_TABLET | Freq: Every day | ORAL | 0 refills | Status: DC

## 2017-12-21 NOTE — Assessment & Plan Note (Signed)
At goal.  However she is no longer on and are been considering her uncontrolled diabetes we had a discussion today regarding yearly urine microalbumin screening versus a retrial of a different ARB since she was unfortunately unable to tolerate lisinopril due to cough in the past.  Patient agreed to try irbesartan 75 mg daily.  She was counseled that we could either discontinue her hydrochlorothiazide or dose reduce if she is has hypo-tension symptoms with lightheadedness or dizziness.  She voiced good understanding

## 2017-12-21 NOTE — Patient Instructions (Signed)
For your diabetes, Start rechecking your sugars. If your morning fasting glucose is greater than 180 (for 2-3days in a row), then increase your lantus by 1U. If your morning fasting is less than 80, then go back up to your previous lantus dose.    For you blood pressure, We will change out the losartan for irbesartan 70mg  daily   For your foot rash -try antifungal cream twice a day for 4 weeks   Please check-out at the front desk before leaving the clinic. Make an appointment in 1 month to follow up on your blood pressure and your diabetes.

## 2017-12-21 NOTE — Progress Notes (Signed)
Subjective:  Virginia [name] is a 47 y.o. female who presents to the [facility] today for hypertension follow-up  HPI:  HYPERTENSION  Disease Monitoring does not monitor at home Medications: Has been on HCTZ 25 mg daily and losartan 100 mg daily.  She has stopped taking the losartan because she felt like it made her dizzy and she is not willing to retry it even at a lower dose.  Since stopping this medication she has had no further lightheadedness or dizziness. She states that she is very compliant on her HCTZ and it makes her urinate frequently.  No dysuria Edema- no    Chest pain- no      Dyspnea- no     DIABETES  Disease Monitoring she has not monitor her blood glucoses at home for the last 1 to 2 months.  She is willing to restart as she stopped "out of laziness" but it is very important to her to have good diabetes control and she is wanting to be more involved in her care Medication: She is taking Jardiance 25 mg daily, compliant and tolerating well.  She has not had any UTIs or dysuria or urinary urgency.  She does have frequent urination from her diuretic however which is not changed.  She states that she has gone down on her Lantus recently because she felt like she had some hypoglycemic symptoms of lightheadedness or dizziness but she was unsure if that was the insulin or the blood pressure medication New Visual problems- denies    Rash She has had a rash on the tops of her feet for at least the last couple of months that she feels are very itchy and spreading.  She has seen podiatry for this but they told her that she needed to see a dermatologist.  Also sees podiatry for plantar fasciitis and she feels like the injections are giving her not working and she would like a new referral to a different provider.  ROS: Per HPI  Social Hx: She reports that she quit smoking about 19 years ago. Her smoking use included cigarettes. She started smoking about 26 years ago. She has a 1.75 pack-year  smoking history. She has never used smokeless tobacco. She reports that she does not drink alcohol or use drugs.   Objective:  Physical Exam: BP 138/84   Pulse 91   Temp 97.9 F (36.6 C) (Oral)   Wt 228 lb 9.6 oz (103.7 kg)   SpO2 97%   BMI 36.90 kg/m   Gen: NAD, resting comfortably CV: RRR with no murmurs appreciated Pulm: NWOB, CTAB with no crackles, wheezes, or rhonchi GI: Normal bowel sounds present. Soft, Nontender, Nondistended. MSK: no edema, cyanosis, or clubbing noted Skin: Erythematous scaly rash on both plantar surfaces of her feet. See picture below Neuro: grossly normal, moves all extremities Psych: Normal affect and thought content      Results for orders placed or performed in visit on 12/21/17 (from the past 72 hour(s))  HgB A1c     Status: Abnormal   Collection Time: 12/21/17  1:52 PM  Result Value Ref Range   Hemoglobin A1C     HbA1c POC (<> result, manual entry)     HbA1c, POC (prediabetic range)     HbA1c, POC (controlled diabetic range) 10.4 (A) 0.0 - 7.0 %     Assessment/Plan:  DM (diabetes mellitus), type 2, uncontrolled (HCC) Uncontrolled.  Her A1c is increased to 10.4 today from 10.2 in May.  Discussed  that A1c goal would be under 7.  Patient is unfortunately unable to tolerate metformin.  We discussed continuing Jardiance 25 mg daily.  She will also continue her Lantus and feels comfortable with self titrating at home.  She will increase by 1 unit for every 3 days her blood glucose is greater than 180.  Discussed hypoglycemic symptoms and decreasing her Lantus if she is either symptomatic or has a blood glucose less than 80 for her morning fasting.  Patient voiced good understanding and plans to follow-up in 1 month with her home glucose readings/glucometer  HYPERTENSION, BENIGN SYSTEMIC At goal.  However she is no longer on and are been considering her uncontrolled diabetes we had a discussion today regarding yearly urine microalbumin screening  versus a retrial of a different ARB since she was unfortunately unable to tolerate lisinopril due to cough in the past.  Patient agreed to try irbesartan 75 mg daily.  She was counseled that we could either discontinue her hydrochlorothiazide or dose reduce if she is has hypo-tension symptoms with lightheadedness or dizziness.  She voiced good understanding  Tinea pedis of both feet Patient has a scaly rash that is reported to be quite itchy.  Trial of topical antifungal clotrimazole twice daily for 4 weeks.  Plantar fasciitis Patient would like a second opinion for treatment of her plantar fasciitis.  Referral for podiatry placed today   Leland Her, DO PGY-3, [facility] 12/21/2017 2:05 PM

## 2017-12-21 NOTE — Assessment & Plan Note (Signed)
Patient would like a second opinion for treatment of her plantar fasciitis.  Referral for podiatry placed today

## 2017-12-21 NOTE — Assessment & Plan Note (Signed)
Patient has a scaly rash that is reported to be quite itchy.  Trial of topical antifungal clotrimazole twice daily for 4 weeks.

## 2017-12-21 NOTE — Assessment & Plan Note (Signed)
Uncontrolled.  Her A1c is increased to 10.4 today from 10.2 in May.  Discussed that A1c goal would be under 7.  Patient is unfortunately unable to tolerate metformin.  We discussed continuing Jardiance 25 mg daily.  She will also continue her Lantus and feels comfortable with self titrating at home.  She will increase by 1 unit for every 3 days her blood glucose is greater than 180.  Discussed hypoglycemic symptoms and decreasing her Lantus if she is either symptomatic or has a blood glucose less than 80 for her morning fasting.  Patient voiced good understanding and plans to follow-up in 1 month with her home glucose readings/glucometer

## 2018-01-27 ENCOUNTER — Ambulatory Visit: Payer: 59 | Admitting: Family Medicine

## 2018-02-17 ENCOUNTER — Ambulatory Visit: Payer: 59 | Admitting: Family Medicine

## 2018-02-17 ENCOUNTER — Encounter: Payer: Self-pay | Admitting: Family Medicine

## 2018-02-17 ENCOUNTER — Other Ambulatory Visit: Payer: Self-pay

## 2018-02-17 VITALS — BP 136/68 | HR 93 | Temp 98.4°F | Ht 66.0 in | Wt 227.0 lb

## 2018-02-17 DIAGNOSIS — Z872 Personal history of diseases of the skin and subcutaneous tissue: Secondary | ICD-10-CM | POA: Diagnosis not present

## 2018-02-17 DIAGNOSIS — I10 Essential (primary) hypertension: Secondary | ICD-10-CM

## 2018-02-17 DIAGNOSIS — E1165 Type 2 diabetes mellitus with hyperglycemia: Secondary | ICD-10-CM | POA: Diagnosis not present

## 2018-02-17 MED ORDER — IRBESARTAN 75 MG PO TABS: 75.0000 mg | ORAL_TABLET | Freq: Every day | ORAL | 3 refills | Status: DC

## 2018-02-17 MED ORDER — INSULIN GLARGINE 100 UNIT/ML SOLOSTAR PEN: 37.0000 [IU] | PEN_INJECTOR | Freq: Every morning | SUBCUTANEOUS | 11 refills | Status: DC

## 2018-02-17 NOTE — Progress Notes (Signed)
    Subjective:  Virginia [name] is a 47 y.o. female who [facility] for diabetes and HTN follow up  HPI:  Diabetes.  Doing well on lantus, now currently on 37U qd. Still taking jardiance 25mg  qd as well. Tolerating well Home sugars have been 160s. No lows.  Has been eating much better which a few slip ups during the holidays.  Not exercising Diabetic Review of Systems - medication compliance: compliant all of the time, diabetic diet compliance: compliant most of the time, further diabetic ROS: no polyuria or polydipsia, no chest pain, dyspnea or TIA's, no numbness, tingling or pain in extremities, no unusual visual symptoms, no hypoglycemia, no medication side effects noted.    Hypertension  Started irbesartan and tolerating well without cough. Has been out of it for the past 1 month but wants to restart. ROS: not taking medications regularly as instructed, no medication side effects noted, patient does not perform home BP monitoring, no TIA's, no chest pain on exertion, no dyspnea on exertion and no swelling of ankles.   Skin concerns Has long standing scars on her arms and legs that are very dark and cosmetically undesirable. She has tried OTC skin lightening or scar reducing creams without success. They do not itch or otherwise bother her. She would like to see a dermatologist to see what can be done about the appearance. Her has no rashes.    ROS: Per HPI  Social Hx: She reports that she quit smoking about 19 years ago. Her smoking use included cigarettes. She started smoking about 26 years ago. She has a 1.75 pack-year smoking history. She has never used smokeless tobacco. She reports that she does not drink alcohol or use drugs.   Objective:  Physical Exam: BP 136/68   Pulse 93   Temp 98.4 F (36.9 C) (Oral)   Ht 5\' 6"  (1.676 m)   Wt 227 lb (103 kg)   SpO2 99%   BMI 36.64 kg/m   Gen: NAD, resting comfortably CV: RRR with no murmurs appreciated Pulm: NWOB,  CTAB with no crackles, wheezes, or rhonchi GI: Normal bowel sounds present. Soft, Nontender, Nondistended. MSK: no edema, cyanosis, or clubbing noted Skin: warm, dry. Dark flat scars on shins and R elbow area Neuro: grossly normal, moves all extremities Psych: Normal affect and thought content   Assessment/Plan:  DM (diabetes mellitus), type 2, uncontrolled (HCC) Based on reported home glucose readings patient control seems to be improving. Continue lantus 37U qd and jardiance 25mg  qd. No metformin due to patient intolerance. Counseled patient on diet and exercise. Follow up in 1 month.  HYPERTENSION, BENIGN SYSTEMIC Resume irbesartan 75mg  qd today since patient was able to tolerate well. Continue HCTZ 25mg  qd. Will check BMP at 1 month follow up  H/O hyperpigmentation of skin Attempted to reassure patient. Referral placed to dermatology.   Leland HerElsia J , DO PGY-3, [facility] 02/17/2018 11:12 AM

## 2018-02-17 NOTE — Assessment & Plan Note (Signed)
Based on reported home glucose readings patient control seems to be improving. Continue lantus 37U qd and jardiance 25mg  qd. No metformin due to patient intolerance. Counseled patient on diet and exercise. Follow up in 1 month.

## 2018-02-17 NOTE — Progress Notes (Signed)
bm 

## 2018-02-17 NOTE — Assessment & Plan Note (Signed)
Resume irbesartan 75mg  qd today since patient was able to tolerate well. Continue HCTZ 25mg  qd. Will check BMP at 1 month follow up

## 2018-02-17 NOTE — Patient Instructions (Addendum)
Call [facility]  [address] [place], [place] [address] Phone: [phone]   It was good to see you today!  Keep up the good work with your lantus and eating healthy.  Please check-out at the front desk before leaving the clinic. Make an appointment in  1 month.  Please bring all of your medications with you to each visit.   Sign up for My Chart to have easy access to your labs results, and communication with your primary care physician.  Feel free to call with any questions or concerns at any time, at [phone].   Take care,  Dr. Leland HerElsia J Jaqwan Wieber, DO [facility]

## 2018-02-17 NOTE — Assessment & Plan Note (Signed)
Attempted to reassure patient. Referral placed to dermatology.

## 2018-03-24 ENCOUNTER — Other Ambulatory Visit: Payer: Self-pay | Admitting: Family Medicine

## 2018-03-24 ENCOUNTER — Ambulatory Visit: Payer: 59 | Admitting: Family Medicine

## 2018-03-31 ENCOUNTER — Encounter: Payer: Self-pay | Admitting: Family Medicine

## 2018-03-31 ENCOUNTER — Ambulatory Visit: Payer: 59 | Admitting: Family Medicine

## 2018-03-31 ENCOUNTER — Other Ambulatory Visit: Payer: Self-pay

## 2018-03-31 VITALS — BP 122/64 | HR 96 | Temp 98.2°F | Ht 66.0 in | Wt 231.0 lb

## 2018-03-31 DIAGNOSIS — E1165 Type 2 diabetes mellitus with hyperglycemia: Secondary | ICD-10-CM

## 2018-03-31 DIAGNOSIS — Z114 Encounter for screening for human immunodeficiency virus [HIV]: Secondary | ICD-10-CM

## 2018-03-31 DIAGNOSIS — I10 Essential (primary) hypertension: Secondary | ICD-10-CM

## 2018-03-31 DIAGNOSIS — E782 Mixed hyperlipidemia: Secondary | ICD-10-CM | POA: Diagnosis not present

## 2018-03-31 LAB — POCT GLYCOSYLATED HEMOGLOBIN (HGB A1C): HbA1c, POC (controlled diabetic range): 9.2 % — AB (ref 0.0–7.0)

## 2018-03-31 MED ORDER — ALCOHOL PADS 70 % PADS: 1.0000 | MEDICATED_PAD | 3 refills | Status: DC

## 2018-03-31 MED ORDER — INSULIN GLARGINE 100 UNIT/ML SOLOSTAR PEN: 37.0000 [IU] | PEN_INJECTOR | Freq: Every morning | SUBCUTANEOUS | 11 refills | Status: DC

## 2018-03-31 NOTE — Assessment & Plan Note (Signed)
Continue atorvastatin 40 mg daily.  Check lipid panel

## 2018-03-31 NOTE — Assessment & Plan Note (Signed)
Uncontrolled but with improved A1c.  Increase Lantus to 40 units daily.  Patient will hold Jardiance until she recovers from her gastroenteritis but plans to resume Jardiance 25 mg daily as she had been doing well on this.  She is intolerant to metformin and Victoza.  Counseled patient on diet and exercise.  She will get her diabetic eye exam this year.  Follow-up in 1 month.

## 2018-03-31 NOTE — Addendum Note (Signed)
Addended by: Henri Medal on: 03/31/2018 10:00 AM   Modules accepted: Orders

## 2018-03-31 NOTE — Patient Instructions (Addendum)
It was good to see you today!  Start exercising.   We are checking some labs today. If results require attention, either myself or my nurse will get in touch with you. If everything is normal, you will get a letter in the mail or a message in My Chart. Please give us a call if you do not hear from us after 2 weeks.   Please bring all of your medications with you to each visit.   Sign up for My Chart to have easy access to your labs results, and communication with your primary care physician.  Feel free to call with any questions or concerns at any time, at [phone].   Take care,  Dr. Leland HerElsia J Arlisa Leclere, DO [facility]    Diet Recommendations for Diabetes  Carbohydrate includes starch, sugar, and fiber.  Of these, only sugar and starch raise blood glucose.  (Fiber is found in fruits, vegetables [especially skin, seeds, and stalks] and whole grains.)   Starchy (carb) foods: Bread, rice, pasta, potatoes, corn, cereal, grits, crackers, bagels, muffins, all baked goods.  (Fruit, milk, and yogurt also have carbohydrate, but most of these foods will not spike your blood sugar as most starchy foods will.)  A few fruits do cause high blood sugars; use small portions of bananas (limit to 1/2 at a time), grapes, watermelon, oranges, and most tropical fruits.   Protein foods: Meat, fish, poultry, eggs, dairy foods, and beans such as pinto and kidney beans (beans also provide carbohydrate).   1. Eat at least REAL 3 meals and 1-2 snacks per day. Never go more than 4-5 hours while awake without eating. Eat breakfast within the first hour of getting up.   2. Limit starchy foods to TWO per meal and ONE per snack. ONE portion of a starchy  food is equal to the following:   - ONE slice of bread (or its equivalent, such as half of a hamburger bun).   - 1/2 cup of a "scoopable" starchy food such as potatoes or rice.   - 15 grams of Total Carbohydrate as shown on food label.   - Every 4 ounces of  a sweet drink (including fruit juice) 3. Include at every meal: a protein food, a carb food, and vegetables and/or fruit.   - Obtain twice the volume of veg's as protein or carbohydrate foods for both lunch and dinner.   - Fresh or frozen veg's are best.   - Keep frozen veg's on hand for a quick vegetable serving.

## 2018-03-31 NOTE — Progress Notes (Signed)
Subjective:  Virginia [name] is a 48 y.o. female who presents to the [facility] today for diabetes follow-up  HPI:  Diabetes Patient states that she thinks her diabetes control has worsened recently because she has not been following a good diet over the holidays.  She states that her sugars have been averaging in the 150s with an occasional low 200s.  She has had no hypoglycemic events.  She has no polyuria or polydipsia.   She does have diabetic neuropathy in her feet that has been consistent without acute worsening.  She takes gabapentin for this which helps.  She is currently on Lantus 37 units daily.  She has been sick recently with upset stomach for the last 2 days and diarrhea so she held her home Jardiance and hydrochlorothiazide during this time.  Normally she does not have any issues with either of these medications.  No fever. She recently started a healthy shake diet where she has been substituting milkshake for breakfast and lunch.  She works third shift so her biggest meal is dinner.  Her snacks are usually salads with chicken.  She likes to eat a lot of grapes.  Hypertension She states that she has been compliant on her irbesartan 75 mg daily.  She normally is compliant on hydrochlorothiazide 25 mg daily except for the last 2 days as per above.  She does not have any lightheadedness or dizziness. She has no chest pain or shortness of breath.  She does not have any swelling in her legs.  Hyperlipidemia She states she is compliant on atorvastatin 40 mg daily.  No muscle aches.  No side effects from the medications.    ROS: Per HPI  Social Hx: She reports that she quit smoking about 19 years ago. Her smoking use included cigarettes. She started smoking about 27 years ago. She has a 1.75 pack-year smoking history. She has never used smokeless tobacco. She reports that she does not drink alcohol or use drugs.   Objective:  Physical Exam: BP 122/64   Pulse 96   Temp 98.2 F (36.8 C)  (Oral)   Ht 5\' 6"  (1.676 m)   Wt 231 lb (104.8 kg)   SpO2 94%   BMI 37.28 kg/m   Gen: NAD, resting comfortably CV: RRR with no murmurs appreciated Pulm: NWOB, CTAB with no crackles, wheezes, or rhonchi GI: Normal bowel sounds present. Soft, Nontender, Nondistended. MSK: no edema, cyanosis, or clubbing noted Skin: warm, dry.  Neuro: grossly normal, moves all extremities Psych: Normal affect and thought content  Results for orders placed or performed in visit on 03/31/18 (from the past 72 hour(s))  HgB A1c     Status: Abnormal   Collection Time: 03/31/18  9:37 AM  Result Value Ref Range   Hemoglobin A1C     HbA1c POC (<> result, manual entry)     HbA1c, POC (prediabetic range)     HbA1c, POC (controlled diabetic range) 9.2 (A) 0.0 - 7.0 %     Assessment/Plan:  DM (diabetes mellitus), type 2, uncontrolled (HCC) Uncontrolled but with improved A1c.  Increase Lantus to 40 units daily.  Patient will hold Jardiance until she recovers from her gastroenteritis but plans to resume Jardiance 25 mg daily as she had been doing well on this.  She is intolerant to metformin and Victoza.  Counseled patient on diet and exercise.  She will get her diabetic eye exam this year.  Follow-up in 1 month.  HYPERTENSION, BENIGN SYSTEMIC Controlled.  Continue irbesartan 75 mg daily.  Patient will resume her hydrochlorothiazide 25 mg daily after she is over  Gastroenteritis.  Check BMP today  Hyperlipidemia Continue atorvastatin 40 mg daily.  Check lipid panel   Leland HerElsia J Jhair Witherington, DO PGY-3, [facility] 03/31/2018 9:14 AM

## 2018-03-31 NOTE — Assessment & Plan Note (Signed)
Controlled.  Continue irbesartan 75 mg daily.  Patient will resume her hydrochlorothiazide 25 mg daily after she is over  Gastroenteritis.  Check BMP today

## 2018-04-01 ENCOUNTER — Encounter: Payer: Self-pay | Admitting: Family Medicine

## 2018-04-01 ENCOUNTER — Other Ambulatory Visit: Payer: Self-pay | Admitting: Family Medicine

## 2018-04-01 DIAGNOSIS — E1165 Type 2 diabetes mellitus with hyperglycemia: Secondary | ICD-10-CM

## 2018-04-01 LAB — BASIC METABOLIC PANEL
BUN/Creatinine Ratio: 11 (ref 9–23)
BUN: 7 mg/dL (ref 6–24)
CALCIUM: 9 mg/dL (ref 8.7–10.2)
CO2: 24 mmol/L (ref 20–29)
Chloride: 108 mmol/L — ABNORMAL HIGH (ref 96–106)
Creatinine, Ser: 0.62 mg/dL (ref 0.57–1.00)
GFR calc Af Amer: 123 mL/min/{1.73_m2} (ref >59)
GFR calc non Af Amer: 107 mL/min/{1.73_m2} (ref >59)
Glucose: 117 mg/dL — ABNORMAL HIGH (ref 65–99)
Potassium: 3.9 mmol/L (ref 3.5–5.2)
Sodium: 146 mmol/L — ABNORMAL HIGH (ref 134–144)

## 2018-04-01 LAB — HIV ANTIBODY (ROUTINE TESTING W REFLEX): HIV SCREEN 4TH GENERATION: NONREACTIVE

## 2018-04-01 LAB — LIPID PANEL
Chol/HDL Ratio: 2.3 ratio (ref 0.0–4.4)
Cholesterol, Total: 96 mg/dL — ABNORMAL LOW (ref 100–199)
HDL: 41 mg/dL (ref >39)
LDL Calculated: 41 mg/dL (ref 0–99)
Triglycerides: 71 mg/dL (ref 0–149)
VLDL CHOLESTEROL CAL: 14 mg/dL (ref 5–40)

## 2018-04-05 ENCOUNTER — Telehealth: Payer: Self-pay

## 2018-04-05 DIAGNOSIS — E1165 Type 2 diabetes mellitus with hyperglycemia: Secondary | ICD-10-CM

## 2018-04-05 MED ORDER — BASAGLAR KWIKPEN 100 UNIT/ML ~~LOC~~ SOPN: 40.0000 [IU] | PEN_INJECTOR | Freq: Every day | SUBCUTANEOUS | 3 refills | Status: DC

## 2018-04-05 NOTE — Telephone Encounter (Signed)
Received fax from pharmacy, PA needed on Lantus Solostar.    According to CoverMyMeds:  "Using [employer] 2020's published formulary we have determined that preferred agents for your patients are likely:  Basaglar KwikPen Preferred Levemir Preferred Levemir FlexTouch Preferred Evaristo Bury FlexTouch Preferred"  Please send in preferred alternative or let RN clinic know if you'd like to attempt a PA on Lantus Solostar.  Ples Specter, RN [facility] RN)

## 2018-04-05 NOTE — Telephone Encounter (Signed)
Change to basaglar per formulary.

## 2018-04-06 NOTE — Telephone Encounter (Signed)
Attempted to contact patient to inform of medication change. LVM to have patient call me back to discuss.

## 2018-04-06 NOTE — Telephone Encounter (Signed)
Patient aware. Alisa Brake, RN ([facility] RN)  

## 2018-05-03 ENCOUNTER — Ambulatory Visit: Payer: 59 | Admitting: Family Medicine

## 2018-05-26 ENCOUNTER — Ambulatory Visit (INDEPENDENT_AMBULATORY_CARE_PROVIDER_SITE_OTHER): Payer: 59

## 2018-05-26 ENCOUNTER — Ambulatory Visit: Payer: 59 | Admitting: Podiatry

## 2018-05-26 ENCOUNTER — Encounter: Payer: Self-pay | Admitting: Podiatry

## 2018-05-26 ENCOUNTER — Other Ambulatory Visit: Payer: Self-pay

## 2018-05-26 DIAGNOSIS — M2042 Other hammer toe(s) (acquired), left foot: Secondary | ICD-10-CM | POA: Diagnosis not present

## 2018-05-26 DIAGNOSIS — M79674 Pain in right toe(s): Secondary | ICD-10-CM

## 2018-05-26 DIAGNOSIS — B351 Tinea unguium: Secondary | ICD-10-CM

## 2018-05-26 DIAGNOSIS — M79675 Pain in left toe(s): Secondary | ICD-10-CM

## 2018-05-26 DIAGNOSIS — M722 Plantar fascial fibromatosis: Secondary | ICD-10-CM | POA: Diagnosis not present

## 2018-05-26 MED ORDER — TRIAMCINOLONE ACETONIDE 10 MG/ML IJ SUSP
10.0000 mg | Freq: Once | INTRAMUSCULAR | Status: AC
  Administered 2018-05-26: 10 mg

## 2018-05-26 NOTE — Progress Notes (Signed)
Subjective:   Patient ID: Virginia [name], female   DOB: 48 y.o.   MRN: [id number]   HPI Patient presents stating she is had a flareup in her right heel a few days ago after 6 months of relief and also has significant nail disease bilateral which are thickened dystrophic and hard for her to cut.  Patient does have diabetes and cannot take care of this herself   ROS      Objective:  Physical Exam  Exquisite discomfort plantar aspect right heel at the insertional point tendon calcaneus with mycotic nail infection with long-term diabetes     Assessment:  Reviewed condition discussed plan of fasciitis and the nail disease     Plan:  Sterile prep injected plantar fascial right 3 mg Kenalog 5 mg Xylocaine and went ahead debrided nailbeds 1-5 both feet with no iatrogenic bleeding noted

## 2018-06-04 ENCOUNTER — Other Ambulatory Visit: Payer: Self-pay | Admitting: Family Medicine

## 2018-07-05 ENCOUNTER — Telehealth: Payer: Self-pay | Admitting: *Deleted

## 2018-07-05 ENCOUNTER — Encounter: Payer: Self-pay | Admitting: Family Medicine

## 2018-07-05 ENCOUNTER — Telehealth (INDEPENDENT_AMBULATORY_CARE_PROVIDER_SITE_OTHER): Payer: 59 | Admitting: Family Medicine

## 2018-07-05 DIAGNOSIS — H8113 Benign paroxysmal vertigo, bilateral: Secondary | ICD-10-CM | POA: Diagnosis not present

## 2018-07-05 DIAGNOSIS — J301 Allergic rhinitis due to pollen: Secondary | ICD-10-CM

## 2018-07-05 MED ORDER — FLUTICASONE PROPIONATE 50 MCG/ACT NA SUSP: 2.0000 | Freq: Every day | NASAL | 6 refills | Status: DC

## 2018-07-05 MED ORDER — ONDANSETRON HCL 4 MG PO TABS: 4.0000 mg | ORAL_TABLET | Freq: Three times a day (TID) | ORAL | 0 refills | Status: DC | PRN

## 2018-07-05 NOTE — Telephone Encounter (Signed)
Pt calls back.  Since she works 3rd shift, she needs the letter to say return 07/13/18 or she will have to return on Sunday.   She would also like this faxed to [phone] Attn: Dawn  Will forward to Dr. Manson Passey, who completed telemedicine. Jone Baseman, CMA

## 2018-07-05 NOTE — Telephone Encounter (Signed)
Letter addended, please print and fax.   Terisa Starr, MD  Family Medicine Teaching Service

## 2018-07-05 NOTE — Telephone Encounter (Signed)
[facility] Telemedicine Visit  Patient consented to have virtual visit. Method of visit: Telephone  Encounter participants: Patient: Virginia [name] - located at home Provider: Westley Chandler - located at [facility] Others (if applicable): None   Chief Complaint: vertigo and headache   HPI:  Satia C Gopalakrishnan is a pleasant 48 year old woman with history of obesity, vertigo thought to be due to benign paroxysmal positional vertigo, type 2 diabetes and hypertension.  The patient calls in today regarding nausea and dizziness.  She consented to have a telephone visit to go to her insurance.  The patient reports a 4-day history of intermittent dizziness.  This is the worse when she lays on her left side.  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  She reports some associated nausea.  She has been taking her blood pressure.  Her most recent blood pressure is 120/80.  Standing does not make her dizziness worse.  She describes her dizziness as a sensation of disequilibrium as though she might fall when she turns her head to the left.  This is similar to her prior episodes of vertigo she reports.  She specifically denies numbness, weakness, changes in her speech, falls, difficulty eating or drinking.  She says Zofran helps.  She finds her meclizine ineffective she has never been prescribed exercises for this condition.  ROS: per HPI  Pertinent PMHx:  Type 2 diabetes, Hypertension History of vertigo Diabetic neuropathy  Exam:  Respiratory: Breathing comfortably, speaking in full sentences no distress  Assessment/Plan: Diagnoses and all orders for this visit:  Benign paroxysmal positional vertigo due to bilateral vestibular disorder, the patient endorses some left ear fullness and congestion, she may have triggered her vertigo in the setting of allergies.  Differential includes vestibular neuritis, less likely Mnire's disease.  No hearing  changes.  No tinnitus.  Reviewed strict return precautions including signs and symptoms of cerebrovascular accident.  The patient is to check her blood sugars given her poorly controlled type 2 diabetes.  Letter given for work. -     ondansetron (ZOFRAN) 4 MG tablet; Take 1 tablet (4 mg total) by mouth every 8 (eight) hours as needed for nausea or vomiting.  Seasonal allergic rhinitis due to pollen -     fluticasone (FLONASE) 50 MCG/ACT nasal spray; Place 2 sprays into both nostrils daily.   Time spent during visit with patient: 8 minutes

## 2018-07-05 NOTE — Progress Notes (Signed)
Letter addended, please fax to preferred location as detailed in nurse note.

## 2018-07-05 NOTE — Progress Notes (Signed)
Please mail letter and information to patient. Please call patient and let her know that in order to release via MyChart, she will need to activate her access.

## 2018-07-05 NOTE — Telephone Encounter (Signed)
Faxed

## 2018-07-07 ENCOUNTER — Telehealth: Payer: Self-pay | Admitting: Family Medicine

## 2018-07-07 NOTE — Telephone Encounter (Signed)
Clinical info completed on Limited Wage Continuation Plan form.  Place form in PCP's box for completion. Routed to PCP  Aquilla Solian, CMA

## 2018-07-07 NOTE — Telephone Encounter (Signed)
Limited Wage Continuation plan  form dropped off for at front desk for completion.  Verified that patient section of form has been completed.  Last DOS/WCC with PCP was02/17/20  Placed form in team folder to be completed by clinical staff.  Karna Dupes  Also per daughter states patient spoke on phone with Dr Manson Passey she said Dr Manson Passey stated to give to her.

## 2018-07-08 ENCOUNTER — Encounter: Payer: Self-pay | Admitting: Family Medicine

## 2018-07-08 NOTE — Telephone Encounter (Signed)
Called patient who has been written out of work vertigo from 4/20 to 4/28.  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  She has been doing her home exercises once a day.  Has not yet had much improvement.  Being in the car or bending down to  pick something up really triggers her vertigo at this time.  Form was completed and placed in fax file

## 2018-07-12 ENCOUNTER — Ambulatory Visit (INDEPENDENT_AMBULATORY_CARE_PROVIDER_SITE_OTHER): Payer: 59 | Admitting: Family Medicine

## 2018-07-12 ENCOUNTER — Other Ambulatory Visit: Payer: Self-pay

## 2018-07-12 VITALS — BP 130/89 | HR 96 | Temp 97.9°F | Ht 66.0 in | Wt 237.4 lb

## 2018-07-12 DIAGNOSIS — H8113 Benign paroxysmal vertigo, bilateral: Secondary | ICD-10-CM | POA: Diagnosis not present

## 2018-07-12 NOTE — Progress Notes (Signed)
   [name] Phone: [phone]   cc: vertigo  Subjective:  Has history of vertigo for ten years. 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  She Has to get up a ladder at work.  Has been on sick leave since her episode of vertigo at work approximately 10 days ago.Vertigo comes and goes  Got nauseous but didn't throw up.  Patient Does BPPV exercises in bed but they just make her more nauseous.  It felt like everthing 'was going sideways'  If she turns head or when she accelerates when driving. She does not have headaches, hearing changes, or tinnitus. She was having some photosensitivity last week.   Patient's Ears were hurting Saturday.  Describes them as 'Scratchy and itchy'.  Her Ears are just sore now. No longer itching. Has been taking nasal steroids since her ears became itchy.     ROS: See HPI for pertinent positives and negatives  Past Medical History  Family history reviewed for today's visit. No changes.   Objective: BP 130/89   Pulse 96   Temp 97.9 F (36.6 C) (Oral)   Ht 5\' 6"  (1.676 m)   Wt 237 lb 6.4 oz (107.7 kg)   SpO2 99%   BMI 38.32 kg/m  Gen: NAD, alert and oriented, cooperative with exam HEENT: No purulence or erythema of the tympanic membranes or ear canals bilaterally.  CV: normal rate, regular rhythm. No murmurs, no rubs.  Resp: LCTAB, no wheezes, crackles. normal work of breathing GI: nontender to palpation, BS present, no guarding or organomegaly Psych: Appropriate behavior  Assessment/Plan: BPPV (benign paroxysmal positional vertigo), bilateral Patient has been trying hapley maneuvers on her own but do not feel they are working.  - referral PT vestibular therapy.      Frederic Jericho, MD PGY-1

## 2018-07-12 NOTE — Patient Instructions (Signed)
I have put in a referral to a type of physical therapy called 'vestibular rehabilitation' for your vertigo, which I think is most likely caused by Benign Positional vertigo.  The maneuvers you do to help this can be difficult to do and you may not have been doing them correctly.  These people can help teach you how to do them correctly.    Benign Positional Vertigo Vertigo is the feeling that you or your surroundings are moving when they are not. Benign positional vertigo is the most common form of vertigo. This is usually a harmless condition (benign). This condition is positional. This means that symptoms are triggered by certain movements and positions. This condition can be dangerous if it occurs while you are doing something that could cause harm to you or others. This includes activities such as driving or operating machinery. What are the causes? In many cases, the cause of this condition is not known. It may be caused by a disturbance in an area of the inner ear that helps your brain to sense movement and balance. This disturbance can be caused by:  Viral infection (labyrinthitis).  Head injury.  Repetitive motion, such as jumping, dancing, or running. What increases the risk? You are more likely to develop this condition if:  You are a woman.  You are 27 years of age or older. What are the signs or symptoms? Symptoms of this condition usually happen when you move your head or your eyes in different directions. Symptoms may start suddenly, and usually last for less than a minute. They include:  Loss of balance and falling.  Feeling like you are spinning or moving.  Feeling like your surroundings are spinning or moving.  Nausea and vomiting.  Blurred vision.  Dizziness.  Involuntary eye movement (nystagmus). Symptoms can be mild and cause only minor problems, or they can be severe and interfere with daily life. Episodes of benign positional vertigo may return (recur) over  time. Symptoms may improve over time. How is this diagnosed? This condition may be diagnosed based on:  Your medical history.  Physical exam of the head, neck, and ears.  Tests, such as: ? MRI. ? CT scan. ? Eye movement tests. Your health care provider may ask you to change positions quickly while he or she watches you for symptoms of benign positional vertigo, such as nystagmus. Eye movement may be tested with a variety of exams that are designed to evaluate or stimulate vertigo. ? An electroencephalogram (EEG). This records electrical activity in your brain. ? Hearing tests. You may be referred to a health care provider who specializes in ear, nose, and throat (ENT) problems (otolaryngologist) or a provider who specializes in disorders of the nervous system (neurologist). How is this treated?  This condition may be treated in a session in which your health care provider moves your head in specific positions to adjust your inner ear back to normal. Treatment for this condition may take several sessions. Surgery may be needed in severe cases, but this is rare. In some cases, benign positional vertigo may resolve on its own in 2-4 weeks. Follow these instructions at home: Safety  Move slowly. Avoid sudden body or head movements or certain positions, as told by your health care provider.  Avoid driving until your health care provider says it is safe for you to do so.  Avoid operating heavy machinery until your health care provider says it is safe for you to do so.  Avoid doing any tasks  that would be dangerous to you or others if vertigo occurs.  If you have trouble walking or keeping your balance, try using a cane for stability. If you feel dizzy or unstable, sit down right away.  Return to your normal activities as told by your health care provider. Ask your health care provider what activities are safe for you. General instructions  Take over-the-counter and prescription  medicines only as told by your health care provider.  Drink enough fluid to keep your urine pale yellow.  Keep all follow-up visits as told by your health care provider. This is important. Contact a health care provider if:  You have a fever.  Your condition gets worse or you develop new symptoms.  Your family or friends notice any behavioral changes.  You have nausea or vomiting that gets worse.  You have numbness or a "pins and needles" sensation. Get help right away if you:  Have difficulty speaking or moving.  Are always dizzy.  Faint.  Develop severe headaches.  Have weakness in your legs or arms.  Have changes in your hearing or vision.  Develop a stiff neck.  Develop sensitivity to light. Summary  Vertigo is the feeling that you or your surroundings are moving when they are not. Benign positional vertigo is the most common form of vertigo.  The cause of this condition is not known. It may be caused by a disturbance in an area of the inner ear that helps your brain to sense movement and balance.  Symptoms include loss of balance and falling, feeling that you or your surroundings are moving, nausea and vomiting, and blurred vision.  This condition can be diagnosed based on symptoms, physical exam, and other tests, such as MRI, CT scan, eye movement tests, and hearing tests.  Follow safety instructions as told by your health care provider. You will also be told when to contact your health care provider in case of problems. This information is not intended to replace advice given to you by your health care provider. Make sure you discuss any questions you have with your health care provider. Document Released: 12/09/2005 Document Revised: 08/12/2017 Document Reviewed: 08/12/2017 Elsevier Interactive Patient Education  2019 [employer].   How to Perform the Epley Maneuver The Epley maneuver is an exercise that relieves symptoms of vertigo. Vertigo is the feeling  that you or your surroundings are moving when they are not. When you feel vertigo, you may feel like the room is spinning and have trouble walking. Dizziness is a little different than vertigo. When you are dizzy, you may feel unsteady or light-headed. You can do this maneuver at home whenever you have symptoms of vertigo. You can do it up to 3 times a day until your symptoms go away. Even though the Epley maneuver may relieve your vertigo for a few weeks, it is possible that your symptoms will return. This maneuver relieves vertigo, but it does not relieve dizziness. What are the risks? If it is done correctly, the Epley maneuver is considered safe. Sometimes it can lead to dizziness or nausea that goes away after a short time. If you develop other symptoms, such as changes in vision, weakness, or numbness, stop doing the maneuver and call your health care provider. How to perform the Epley maneuver 1. Sit on the edge of a bed or table with your back straight and your legs extended or hanging over the edge of the bed or table. 2. Turn your head halfway toward the  affected ear or side. 3. Lie backward quickly with your head turned until you are lying flat on your back. You may want to position a pillow under your shoulders. 4. Hold this position for 30 seconds. You may experience an attack of vertigo. This is normal. 5. Turn your head to the opposite direction until your unaffected ear is facing the floor. 6. Hold this position for 30 seconds. You may experience an attack of vertigo. This is normal. Hold this position until the vertigo stops. 7. Turn your whole body to the same side as your head. Hold for another 30 seconds. 8. Sit back up. You can repeat this exercise up to 3 times a day. Follow these instructions at home:  After doing the Epley maneuver, you can return to your normal activities.  Ask your health care provider if there is anything you should do at home to prevent vertigo. He or  she may recommend that you: ? Keep your head raised (elevated) with two or more pillows while you sleep. ? Do not sleep on the side of your affected ear. ? Get up slowly from bed. ? Avoid sudden movements during the day. ? Avoid extreme head movement, like looking up or bending over. Contact a health care provider if:  Your vertigo gets worse.  You have other symptoms, including: ? Nausea. ? Vomiting. ? Headache. Get help right away if:  You have vision changes.  You have a severe or worsening headache or neck pain.  You cannot stop vomiting.  You have new numbness or weakness in any part of your body. Summary  Vertigo is the feeling that you or your surroundings are moving when they are not.  The Epley maneuver is an exercise that relieves symptoms of vertigo.  If the Epley maneuver is done correctly, it is considered safe. You can do it up to 3 times a day. This information is not intended to replace advice given to you by your health care provider. Make sure you discuss any questions you have with your health care provider. Document Released: 03/08/2013 Document Revised: 01/22/2016 Document Reviewed: 01/22/2016 Elsevier Interactive Patient Education  2019 [employer]

## 2018-07-13 ENCOUNTER — Telehealth: Payer: Self-pay

## 2018-07-13 NOTE — Telephone Encounter (Signed)
Pt calls nurse line stating her employer will not accept the note from yesterday, as it does not have a specific date of return. I will recreate the letter and add return date 07/20/2018. This is to be faxed to [phone]. Done.

## 2018-07-14 DIAGNOSIS — H8113 Benign paroxysmal vertigo, bilateral: Secondary | ICD-10-CM | POA: Insufficient documentation

## 2018-07-14 NOTE — Assessment & Plan Note (Signed)
Patient has been trying hapley maneuvers on her own but do not feel they are working.  - referral PT vestibular therapy.

## 2018-07-19 ENCOUNTER — Telehealth (INDEPENDENT_AMBULATORY_CARE_PROVIDER_SITE_OTHER): Payer: 59 | Admitting: Family Medicine

## 2018-07-19 ENCOUNTER — Encounter: Payer: Self-pay | Admitting: Family Medicine

## 2018-07-19 ENCOUNTER — Other Ambulatory Visit: Payer: Self-pay

## 2018-07-19 DIAGNOSIS — H8113 Benign paroxysmal vertigo, bilateral: Secondary | ICD-10-CM

## 2018-07-19 NOTE — Telephone Encounter (Signed)
Pt is calling back again and said that she is still not feeling well from the vertigo and would like to know if Dr. Constance Goltz can have her work note extended. Pt must have this note before tomorrow or she could be terminated. Pt would like to know if it can be extended until next week May 12th.   Pt would like to have it faxed to [phone].

## 2018-07-19 NOTE — Progress Notes (Signed)
[facility] Telemedicine Visit  Patient consented to have virtual visit. Method of visit: Video  Encounter participants: Patient: Virginia [name] - located at home Provider: Leland Her - located at [facility] Others (if applicable): other  Chief Complaint: vertigo follow up  HPI:  Has been doing her home exercises regularly twice a day. Is starting to see some improvements is no longer quite as nauseous as she used to be with eye movements.  However she still has some significant dizziness with looking up.  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  She is doing a little bit better in the car as well. She has not been able to schedule with vestibular PT, she has tried calling.  ROS: per HPI  Pertinent PMHx: BPPV  Exam:  General: Well-appearing no acute distress Respiratory: Normal work of breathing Neuro: Alert, awake, no focal deficits  Assessment/Plan:  BPPV (benign paroxysmal positional vertigo), bilateral Improving with home exercises but not enough to safely return to work at this time.  She has not been able to establish with vestibular PT due to current pandemic restrictions.  Discussed with Dr. Durwin Nora. We will extend her work note to May 22 based on local and national timelines of reopening.  Patient stated that she would be able to return to work earlier if she felt better without a doctor's note. Work note created and placed in fax pile. She will continue her home exercises and given the improvement she is having on them she will increase to 4-5 times per day.   Leland Her, DO PGY-3, [facility] 07/19/2018 2:08 PM

## 2018-07-19 NOTE — Assessment & Plan Note (Signed)
Improving with home exercises but not enough to safely return to work at this time.  She has not been able to establish with vestibular PT due to current pandemic restrictions.  Discussed with Dr. Durwin Nora. We will extend her work note to May 22 based on local and national timelines of reopening.  Patient stated that she would be able to return to work earlier if she felt better without a doctor's note. Work note created and placed in fax pile. She will continue her home exercises and given the improvement she is having on them she will increase to 4-5 times per day.

## 2018-07-19 NOTE — Telephone Encounter (Signed)
It looks like [employer] already sent a letter this morning.

## 2018-08-03 NOTE — Progress Notes (Signed)
   Subjective:    Patient ID: Adella Nissen, female    DOB: 10/17/1970, 48 y.o.   MRN: [id number]   CC: BPPV follow up   HPI: BPPV Seen by Dr. Constance Goltz on 4/27. At that time referral to vestibular PT was made. Per telephone encounter on 5/4 with Dr. Artist Pais patient has not been able to see PT due to COVID restrictions.  Patient has now been out of work for 1 month now.  States that she is feeling much better today than she has in the past.  States that symptoms are "basically gone".  States that she has had no more episodes of vertigo in at least 1 week if not more.  Does remember having one episode over a week ago but this was mild and she could still function during the episode.  Previous episodes caused her to not be able to function.  Patient has been unable to get vestibular PT due to COVID restrictions preventing appointments but she has been doing exercises 4-5 times a day at home.  Patient works at [employer].  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  Does not operate any machinery that involves driving.  Has been driving for the past week at home without any problems.  Has even tried to do activities that previously gave her vertigo and has not had symptoms.   Objective:  BP (!) 144/72   Pulse 98   SpO2 98%  Vitals and nursing note reviewed  General: well nourished, in no acute distress HEENT: normocephalic, TM's visualized bilaterally, PERRL, EOMI, no scleral icterus or conjunctival pallor, no nasal discharge, moist mucous membranes, good dentition without erythema or discharge noted in posterior oropharynx Neck: supple, full ROM Cardiac: RRR, clear S1 and S2, no murmurs, rubs, or gallops Respiratory: clear to auscultation bilaterally, no increased work of breathing Abdomen: soft, nontender, nondistended, no masses or organomegaly. Bowel sounds present Extremities: no edema or cyanosis. Warm, well perfused. 5/5 muscle strength in all extremities,  normal grip strength  Skin: warm and dry, no rashes noted Neuro: alert and oriented, no focal deficits, neg Dix Hallpike maneuver, no finger to nose dysmetria, negative heel to shin, normal gait, CN2-12 intact   Assessment & Plan:    BPPV (benign paroxysmal positional vertigo), bilateral Significantly improved and symptoms have now resolved.  Patient has been asymptomatic for over 1 week.  No symptoms elicited with Dix-Hallpike maneuver.  Able to have full range of motion in neck and extraocular movements were intact.  Patient with normal gait.  Normal neuro exam.  Given that patient has been asymptomatic and neuro exam was normal in clinic she should be cleared to go back to work.  Strict return precautions given.  Advised that if she has any further symptoms of vertigo she must call clinic immediately as we may need to reevaluate her working status at that point.  She is agreeable with this plan.  Advised that she call either as virtual visit, in person visit, or or emergency line if we are closed and she needs an immediate answer if she can go to work the next day or not.  Discussed this with Dr. Jennette Kettle and Chambliss as well.  Note stating that patient is able to return to work given to patient at the end of visit.  Follow-up if no improvement.    Return if symptoms worsen or fail to improve.   Oralia Manis, DO, PGY-2

## 2018-08-04 ENCOUNTER — Ambulatory Visit: Payer: 59 | Admitting: Family Medicine

## 2018-08-04 ENCOUNTER — Other Ambulatory Visit: Payer: Self-pay

## 2018-08-04 ENCOUNTER — Encounter: Payer: Self-pay | Admitting: Family Medicine

## 2018-08-04 DIAGNOSIS — H8113 Benign paroxysmal vertigo, bilateral: Secondary | ICD-10-CM | POA: Diagnosis not present

## 2018-08-04 NOTE — Assessment & Plan Note (Signed)
Significantly improved and symptoms have now resolved.  Patient has been asymptomatic for over 1 week.  No symptoms elicited with Dix-Hallpike maneuver.  Able to have full range of motion in neck and extraocular movements were intact.  Patient with normal gait.  Normal neuro exam.  Given that patient has been asymptomatic and neuro exam was normal in clinic she should be cleared to go back to work.  Strict return precautions given.  Advised that if she has any further symptoms of vertigo she must call clinic immediately as we may need to reevaluate her working status at that point.  She is agreeable with this plan.  Advised that she call either as virtual visit, in person visit, or or emergency line if we are closed and she needs an immediate answer if she can go to work the next day or not.  Discussed this with Dr. Jennette Kettle and Chambliss as well.  Note stating that patient is able to return to work given to patient at the end of visit.  Follow-up if no improvement.

## 2018-08-04 NOTE — Patient Instructions (Signed)
Vertigo  Vertigo means that you feel like you are moving when you are not. Vertigo can also make you feel like things around you are moving when they are not. This feeling can come and go at any time. Vertigo often goes away on its own. Follow these instructions at home:  Avoid making fast movements.  Avoid driving.  Avoid using heavy machinery.  Avoid doing any task or activity that might cause danger to you or other people if you would have a vertigo attack while you are doing it.  Sit down right away if you feel dizzy or have trouble with your balance.  Take over-the-counter and prescription medicines only as told by your doctor.  Follow instructions from your doctor about which positions or movements you should avoid.  Drink enough fluid to keep your pee (urine) clear or pale yellow.  Keep all follow-up visits as told by your doctor. This is important. Contact a doctor if:  Medicine does not help your vertigo.  You have a fever.  Your problems get worse or you have new symptoms.  Your family or friends see changes in your behavior.  You feel sick to your stomach (nauseous) or you throw up (vomit).  You have a "pins and needles" feeling or you are numb in part of your body. Get help right away if:  You have trouble moving or talking.  You are always dizzy.  You pass out (faint).  You get very bad headaches.  You feel weak or have trouble using your hands, arms, or legs.  You have changes in your hearing.  You have changes in your seeing (vision).  You get a stiff neck.  Bright light starts to bother you. This information is not intended to replace advice given to you by your health care provider. Make sure you discuss any questions you have with your health care provider. Document Released: 12/11/2007 Document Revised: 08/09/2015 Document Reviewed: 06/26/2014 Elsevier Interactive Patient Education  [employer].   If you have further symptoms please  call for a virtual or in person visit.   Dr. Darin Engels

## 2018-08-05 ENCOUNTER — Telehealth: Payer: Self-pay | Admitting: *Deleted

## 2018-08-05 ENCOUNTER — Encounter: Payer: Self-pay | Admitting: Family Medicine

## 2018-08-05 NOTE — Telephone Encounter (Signed)
Pt informed.  Letter printed and placed up front for pick up at her request.  Jone Baseman, CMA

## 2018-08-05 NOTE — Telephone Encounter (Signed)
Have re-written work note to keep patient out of work till 08/06/2018 as Dr. Olegario Messier note stated. Have routed to p [facility] admin to either mail to patient or attach via mychart.   Orpah Clinton, PGY-2 [facility] 08/05/2018 11:15 AM

## 2018-08-05 NOTE — Telephone Encounter (Signed)
Pt calls because she was originally taken out of work till 5/22 (see letter from Dr. Artist Pais), the letter that she was given yesterday needs to match that as to not interfere with her STD.  Will forward to Dr. Darin Engels.

## 2018-10-25 ENCOUNTER — Ambulatory Visit: Payer: 59 | Admitting: Podiatry

## 2018-10-25 ENCOUNTER — Ambulatory Visit (INDEPENDENT_AMBULATORY_CARE_PROVIDER_SITE_OTHER): Payer: 59

## 2018-10-25 ENCOUNTER — Other Ambulatory Visit: Payer: Self-pay | Admitting: Podiatry

## 2018-10-25 ENCOUNTER — Encounter: Payer: Self-pay | Admitting: Podiatry

## 2018-10-25 ENCOUNTER — Ambulatory Visit: Payer: 59

## 2018-10-25 ENCOUNTER — Other Ambulatory Visit: Payer: Self-pay

## 2018-10-25 VITALS — Temp 97.8°F

## 2018-10-25 DIAGNOSIS — M7662 Achilles tendinitis, left leg: Secondary | ICD-10-CM | POA: Diagnosis not present

## 2018-10-25 DIAGNOSIS — M79672 Pain in left foot: Secondary | ICD-10-CM

## 2018-10-26 ENCOUNTER — Telehealth: Payer: Self-pay | Admitting: *Deleted

## 2018-10-26 ENCOUNTER — Encounter: Payer: Self-pay | Admitting: Podiatry

## 2018-10-26 DIAGNOSIS — M79662 Pain in left lower leg: Secondary | ICD-10-CM

## 2018-10-26 DIAGNOSIS — R609 Edema, unspecified: Secondary | ICD-10-CM

## 2018-10-26 NOTE — Telephone Encounter (Signed)
Orders faxed to [facility]. 

## 2018-10-26 NOTE — Progress Notes (Signed)
Subjective:   Patient ID: Virginia [name], female   DOB: 48 y.o.   MRN: [id number]   HPI Patient presents stating she is developed a lot of pain in the back of her left Achilles and states is been very hard for her to walk or be active or work at the current time.  States she does not remember injury and it is been going on for approximately a month   ROS      Objective:  Physical Exam  Neurovascular status unchanged and I did note mild discomfort in the calf but it appears to be more compensatory with no edema associated with it.  There is exquisite discomfort at the muscle tendon junction left posterior heel     Assessment:  Strong probability that this is a muscle tendon junction Achilles tendinitis left but I cannot rule out the small possibility of blood clot due to some mild discomfort in the calf     Plan:  Due to the pain she is having at the muscle tendon junction I did apply air fracture walker to mobilize but is precautionary measure I did send for Doppler to make sure there is no other issues noted.  Patient will be seen back in 3 weeks or I will let her know if I see any change on Doppler and I did advise on heat ice and if she develops any swelling of her calf or shortness of breath she is to go straight to the emergency room  X-ray was negative for signs of fracture or other bone pathology associated with injury

## 2018-10-28 ENCOUNTER — Telehealth: Payer: Self-pay | Admitting: Podiatry

## 2018-10-28 ENCOUNTER — Ambulatory Visit (HOSPITAL_COMMUNITY)
Admission: RE | Admit: 2018-10-28 | Discharge: 2018-10-28 | Disposition: A | Discharge status: Home / Self Care | Payer: 59 | Source: Ambulatory Visit | Attending: Cardiovascular Disease | Admitting: Cardiovascular Disease

## 2018-10-28 ENCOUNTER — Other Ambulatory Visit: Payer: Self-pay

## 2018-10-28 ENCOUNTER — Encounter (HOSPITAL_COMMUNITY): Payer: Self-pay

## 2018-10-28 DIAGNOSIS — M79662 Pain in left lower leg: Secondary | ICD-10-CM | POA: Diagnosis present

## 2018-10-28 DIAGNOSIS — R609 Edema, unspecified: Secondary | ICD-10-CM | POA: Insufficient documentation

## 2018-10-28 MED ORDER — DICLOFENAC SODIUM 75 MG PO TBEC: 75.0000 mg | DELAYED_RELEASE_TABLET | Freq: Two times a day (BID) | ORAL | 0 refills | Status: DC

## 2018-10-28 NOTE — Telephone Encounter (Signed)
Ok for diclofenac but does need a doppler to rule out blood clot

## 2018-10-28 NOTE — Telephone Encounter (Signed)
I informed pt I had sent a message to Dr. Paulla Dolly concerning the antiinflammatory medication and she could contact [facility] [phone] to schedule. Pt asked if she should go to PT and I told her if she did not feel comfortable going to PT until tested for the DVT to call PT and explain and reschedule.

## 2018-10-28 NOTE — Telephone Encounter (Signed)
Returning a call from the [facility] ( she believes )about anti inflammatory meds and Xray results.

## 2018-10-28 NOTE — Telephone Encounter (Signed)
Unable leave a message voicemail was full.

## 2018-10-28 NOTE — Progress Notes (Unsigned)
Left lower venous has been completed and is negative for DVT. Preliminary results can be found under CV proc through chart review.   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 This measures approximately 1.35 cm x1.67 cm. There is no vascularity noted.  Wilkie Aye RVT [facility]

## 2018-10-28 NOTE — Telephone Encounter (Signed)
Pt was seen in office on Monday and was supposed to have an anti-inflammatory medication sent in but her pharmacy has not received the prescription.   Pt was also supposed to be scheduled for an imaging appt to check for blood clots and has not heard anything yet. Pt is scheduled for physical therapy but is afraid to go to therapy before being checked for blood clots because the pain is so severe.

## 2018-10-28 NOTE — Addendum Note (Signed)
Addended by: Harriett Sine D on: 10/28/2018 12:42 PM   Modules accepted: Orders

## 2018-10-28 NOTE — Telephone Encounter (Signed)
I informed pt of Dr. Mellody Drown orders. Pt states she has an appt today at 3:00pm for the doppler.

## 2018-11-01 ENCOUNTER — Other Ambulatory Visit: Payer: Self-pay

## 2018-11-01 ENCOUNTER — Ambulatory Visit (INDEPENDENT_AMBULATORY_CARE_PROVIDER_SITE_OTHER): Payer: 59

## 2018-11-01 ENCOUNTER — Ambulatory Visit: Payer: 59 | Admitting: Podiatry

## 2018-11-01 ENCOUNTER — Encounter: Payer: Self-pay | Admitting: Podiatry

## 2018-11-01 ENCOUNTER — Other Ambulatory Visit: Payer: Self-pay | Admitting: Podiatry

## 2018-11-01 VITALS — Temp 97.2°F

## 2018-11-01 DIAGNOSIS — M79671 Pain in right foot: Secondary | ICD-10-CM

## 2018-11-01 DIAGNOSIS — M7662 Achilles tendinitis, left leg: Secondary | ICD-10-CM

## 2018-11-01 DIAGNOSIS — M7661 Achilles tendinitis, right leg: Secondary | ICD-10-CM

## 2018-11-01 DIAGNOSIS — M779 Enthesopathy, unspecified: Secondary | ICD-10-CM | POA: Diagnosis not present

## 2018-11-01 DIAGNOSIS — M778 Other enthesopathies, not elsewhere classified: Secondary | ICD-10-CM

## 2018-11-03 ENCOUNTER — Other Ambulatory Visit: Payer: Self-pay

## 2018-11-03 DIAGNOSIS — E1165 Type 2 diabetes mellitus with hyperglycemia: Secondary | ICD-10-CM

## 2018-11-03 MED ORDER — BASAGLAR KWIKPEN 100 UNIT/ML ~~LOC~~ SOPN: 40.0000 [IU] | PEN_INJECTOR | Freq: Every day | SUBCUTANEOUS | 3 refills | Status: DC

## 2018-11-03 NOTE — Telephone Encounter (Signed)
Please have patient make an appointment with me for a diabetes check. Thank you, have a good day

## 2018-11-03 NOTE — Progress Notes (Signed)
Subjective:   Patient ID: Virginia [name], female   DOB: 48 y.o.   MRN: [id number]   HPI Patient states she started to develop pain in the right ankle and the left Achilles is still hurting but it is moderately improved from previous.  She had a negative Doppler so does not appear to be related to blood clot or any other vascular pathology   ROS      Objective:  Physical Exam  H&P conditions reviewed with patient still having discomfort in the muscle tendon junction of the left Achilles moderate in intensity with mild improvement with immobilization nitro patches and stretching exercises.  Has exquisite discomfort in the sinus tarsi right     Assessment:  Achilles tendinitis left improving but still quite sore and present with muscle tendon junction involvement and sinus tarsitis right still present     Plan:  H&P conditions in both conditions reviewed and for the left we will continue stretching exercises and I am sending to physical therapy for possible dry needling and other techniques to try to improve condition.  I did do a sinus tarsi injection right 3 mg Kenalog 5 mg Xylocaine after appropriate sterile prep of the area which was tolerated well and will reevaluate again in the next several weeks  X-ray today indicated right there is no signs of acute fracture or other bone pathology appears to be soft tissue

## 2018-11-15 ENCOUNTER — Ambulatory Visit: Payer: 59 | Admitting: Podiatry

## 2018-11-19 ENCOUNTER — Ambulatory Visit: Payer: 59 | Admitting: Podiatry

## 2018-12-01 ENCOUNTER — Ambulatory Visit: Payer: 59 | Admitting: Family Medicine

## 2018-12-01 ENCOUNTER — Telehealth: Payer: Self-pay | Admitting: Family Medicine

## 2018-12-01 ENCOUNTER — Other Ambulatory Visit: Payer: Self-pay

## 2018-12-01 VITALS — BP 140/70 | HR 97 | Wt 250.0 lb

## 2018-12-01 DIAGNOSIS — M7660 Achilles tendinitis, unspecified leg: Secondary | ICD-10-CM | POA: Insufficient documentation

## 2018-12-01 MED ORDER — TRAMADOL HCL 50 MG PO TABS: 50.0000 mg | ORAL_TABLET | Freq: Four times a day (QID) | ORAL | 0 refills | Status: AC | PRN

## 2018-12-01 NOTE — Assessment & Plan Note (Signed)
Given the location and distribution of the patient's pain are Achilles tendinitis, Achilles tendinopathy with possible partial tearing of the Achilles tendon, or retrocalcaneal bursitis.  Without having a more accurate diagnosis is difficult to guide the next treatment option for the patient.  Think she would benefit tremendously from an Achilles tendon ultrasound.  I will refer her to sports medicine clinic for Achilles ultrasound and possible further management.  She can continue taking the diclofenac, Tylenol, icing and heating the area.  For her acute pain also give her 3-day supply of tramadol.

## 2018-12-01 NOTE — Telephone Encounter (Signed)
Patient's daughter came into office to drop off form to be completed by PCP for wage continuation plan. Patient's last in office apt was on 12-01-18, forms were placed in red team folder.

## 2018-12-01 NOTE — Progress Notes (Signed)
HPI 48 year old female who presents with left posterior lower leg pain.  Says the pain for started around 2 months ago when she was getting out of a chair and had a sudden sharp pain located in her Achilles tendon area.  She is on her feet for 12 hours a day for 6 or 7 days a week she works at [employer].  [facility] where x-rays were obtained which did not show any acute fracture.  They have her scheduled for an MRI but opted to not treat until it came back.  She is also seen in urgent care where she was given diclofenac, steroid Dosepak, and a boot.  She does not have a boot with her today.  States that the area initially had quite a bit of swelling and bruising but that has somewhat resolved in the last month.  She states the diclofenac did help her pain a little bit but she still has the pain.  Her pain is most significant when she is up on her feet at work or at night when she is trying to sleep.  Particularly going up stairs makes her pain much worse.  CC: Left lower leg pain   ROS:   Review of Systems See HPI for ROS.   CC, SH/smoking status, and VS noted  Objective: BP 140/70   Pulse 97   Wt 250 lb (113.4 kg)   SpO2 95%   BMI 40.35 kg/m  Gen: 48 year old African-American female, no acute distress, resting comfortably. CV: Regular rate rhythm, no M/R/G. Resp: Lungs clear to auscultation bilaterally, no wheezing, no accessory muscle use Neuro: Alert and oriented, Speech clear, No gross deficits Left lower extremity: Inspection: Mild swelling located right Achilles tendon area, lateral ankle. Palpation: Very tender to palpation directly overlying Achilles tendon, with some tenderness palpation more anterior Range of motion: Range of motion fully intact Strength: 5 out of 5 strength to foot extension and flexion.  Cannot stand and plantar flex left foot.  Cannot stand on 1 foot and plantar flex foot. Stability: Ankle joint fully stable  with no apprehension Neurovascular: Skin warm and dry.  Palpable PT DP bilaterally.  Sensation intact all nerve distributions Special test: Janee Mornhompson test with plantar flexion on squeezing of calf muscles.  Assessment and plan:  Achilles tendon pain Given the location and distribution of the patient's pain are Achilles tendinitis, Achilles tendinopathy with possible partial tearing of the Achilles tendon, or retrocalcaneal bursitis.  Without having a more accurate diagnosis is difficult to guide the next treatment option for the patient.  Think she would benefit tremendously from an Achilles tendon ultrasound.  I will refer her to sports medicine clinic for Achilles ultrasound and possible further management.  She can continue taking the diclofenac, Tylenol, icing and heating the area.  For her acute pain also give her 3-day supply of tramadol.   Orders Placed This Encounter  Procedures  . Ambulatory referral to Sports Medicine    Referral Priority:   Routine    Referral Type:   Consultation    Number of Visits Requested:   1    Meds ordered this encounter  Medications  . traMADol (ULTRAM) 50 MG tablet    Sig: Take 1 tablet (50 mg total) by mouth every 6 (six) hours as needed for up to 3 days.    Dispense:  12 tablet    Refill:  0   Myrene BuddyJacob Zubayr Bednarczyk MD PGY-3 Family Medicine Resident  12/01/2018 1:55 PM

## 2018-12-01 NOTE — Telephone Encounter (Signed)
Placed forms in MD box to be filled out. Deseree Kennon Holter, CMA

## 2018-12-01 NOTE — Telephone Encounter (Signed)
Pt lm on nurse line. She is requesting that the paperwork start on 11/30/18 is possible.Christen Bame, CMA

## 2018-12-01 NOTE — Patient Instructions (Addendum)
It was great meeting you today!  I am sorry about your left foot pain.  Given where your pain is at and her history I am concerned that you have 1 of 3 things.  You may have an Achilles tendon partial tear, the bursa behind her Achilles tendon could be inflamed, or you could have just tendinitis in your tendon.  You have been getting some of the right treatments for these but it would be nice to make a diagnosis of this prior to initiating long-term treatment.  For this I will send you to [facility] to get an ultrasound of this area to take a better look.  Up with her following today, he should hear back from them soon.  In the meantime I would continue taking her diclofenac 75 mg 2 times per day.  You can use Tylenol 325 every 4 hours to supplement.  If you are having a lot of pain at night unable to sleep I gave you a 3-day supply of tramadol he can take as needed to help you sleep.  You can also use your boot to help protect that area and use that for comfort if walking.  If Raliegh Ip did order an MRI I would encourage you to get this done.  In regards to getting some time off from your job, you can either fax [place] or bring in your FMLA paperwork I will be happy to fill out for you.

## 2018-12-01 NOTE — Telephone Encounter (Signed)
I will be in the office Friday and will look at and fill out the forms then if I can. Thank you have a great day.

## 2018-12-07 ENCOUNTER — Ambulatory Visit: Payer: Self-pay

## 2018-12-07 ENCOUNTER — Other Ambulatory Visit: Payer: Self-pay

## 2018-12-07 ENCOUNTER — Encounter: Payer: Self-pay | Admitting: Sports Medicine

## 2018-12-07 ENCOUNTER — Ambulatory Visit (INDEPENDENT_AMBULATORY_CARE_PROVIDER_SITE_OTHER): Payer: 59 | Admitting: Sports Medicine

## 2018-12-07 VITALS — BP 138/84 | Ht 66.0 in | Wt 250.0 lb

## 2018-12-07 DIAGNOSIS — M7660 Achilles tendinitis, unspecified leg: Secondary | ICD-10-CM

## 2018-12-07 NOTE — Telephone Encounter (Signed)
Placed form in Dr. Dorise Bullion box to fill out for patient as he saw her for this issue 11/25/2018. Thank you.

## 2018-12-07 NOTE — Progress Notes (Addendum)
[facility] [address] [place], [place] [address] Phone: [phone] Fax: [phone]   Patient Name: Virginia [name] Date of Birth: [birth year] Medical Record Number: [id number] Gender: female Date of Encounter: 12/07/2018  SUBJECTIVE:      Chief Complaint:  Left heel pain   HPI:  Ms. Virginia [name] is a 48 year old F presents with 6 weeks of left heel pain.  She originally saw her podiatrist, who placed her in a walking boot and scheduled physical therapy.  She was subsequently seen by both an orthopedist, urgent care, and her PCP, where x-rays were obtained but did not show any acute findings.  He has been taken off close to 3 weeks of work over this timeframe to rest.  She is scheduled to get a MRI tomorrow.  She describes the pain specifically being at the back of her heel, no tenderness on the inside or outside of her foot.  Aggravating factors include walking for prolonged periods of time.  She wears steel toe shoes at work, is on her feet 12 hours a day for 7 days a week on the job.  This is a new increase in hours do to coworkers being away.  He denies any numbness, tingling, skin changes, weakness, instability.  She felt the physical therapy only aggravated the issue.  The most alleviating factor has been rest and NWB.     ROS:     See HPI.   PERTINENT  PMH / PSH / FH / SH:  Past Medical, Surgical, Social, and Family History Reviewed & Updated in the EMR. Pertinent findings include:  Tobacco use, history of plantar fasciitis, diabetes   OBJECTIVE:  BP 138/84   Ht 5\' 6"  (1.676 m)   Wt 250 lb (113.4 kg)   BMI 40.35 kg/m  Physical Exam:  Vital signs are reviewed.   GEN: Alert and oriented, NAD Pulm: Breathing unlabored PSY: normal mood, congruent affect  MSK: Left ankle No gross deformity, swelling, ecchymoses FROM Strength is equal bilaterally, pain with resisted ROM in PF TTP along distal insertion of Achilles tendon with acute  thickening.  Mild TTP along medial tibia. No TTP at medial or lateral heel No TTP at medial calcaneal tuberosity Negative ant drawer and talar tilt.   Negative syndesmotic compression. Thompsons test negative. S1 reflex intact NV intact distally.  Right ankle No gross deformity, swelling, ecchymoses FROM No TTP Negative ant drawer and talar tilt.   Negative syndesmotic compression. Thompsons test negative. NV intact distally.  Gait analysis: Lateral pes planus with bilateral pronation during ambulation.  Limited MSK ultrasound: Left Achilles The Achilles tendon was traced in long and short axis to its insertion site.  There were mild hyperechoic changes at distal insertion site and long axis.  There is acute thickening of the distal Achilles tendon measured at 0.97 cm.  SAX demonstrated small hypoechoic changes in the distal Achilles insertion.  Impression: Left insertional Achilles tendinosis   ASSESSMENT & PLAN:   1. Left Achilles tendinosis  Patient will have MRI tomorrow, we fitted patient for bilateral heel lifts and taught her eccentric calf exercises to do at home.  Once she has results from MRI, is welcome to follow back with [place] if she desires.  Advised to bring copy of MRI and report if she returns to [place].  Continue to use walking boot.   Lanier Clam, DO, ATC Sports Medicine Fellow  Patient seen and evaluated with the sports medicine fellow.  I agree with the  above plan of care.  MSK ultrasound shows findings consistent with Achilles tendinopathy.  Treatment as above.  She does have an MRI scheduled for tomorrow.  If she would like to follow-up with [place] after that study I would be happy to review those findings with her.  She may ultimately benefit from custom orthotics.  Follow-up as needed.

## 2018-12-08 NOTE — Telephone Encounter (Signed)
Form faxed to [phone] per patients request.

## 2018-12-09 NOTE — Telephone Encounter (Signed)
Late entry  Filled out and placed form in box to be faxed.  Guadalupe Dawn MD PGY-3 Family Medicine Resident

## 2018-12-22 ENCOUNTER — Other Ambulatory Visit: Payer: Self-pay

## 2018-12-22 DIAGNOSIS — E782 Mixed hyperlipidemia: Secondary | ICD-10-CM

## 2018-12-23 MED ORDER — ATORVASTATIN CALCIUM 40 MG PO TABS: 40.0000 mg | ORAL_TABLET | Freq: Every day | ORAL | 3 refills | Status: DC

## 2019-01-13 ENCOUNTER — Other Ambulatory Visit: Payer: Self-pay | Admitting: *Deleted

## 2019-01-13 DIAGNOSIS — E11649 Type 2 diabetes mellitus with hypoglycemia without coma: Secondary | ICD-10-CM

## 2019-01-13 MED ORDER — EMPAGLIFLOZIN 25 MG PO TABS: 25.0000 mg | ORAL_TABLET | Freq: Every day | ORAL | 3 refills | Status: DC

## 2019-02-01 ENCOUNTER — Other Ambulatory Visit: Payer: Self-pay | Admitting: *Deleted

## 2019-02-02 MED ORDER — GABAPENTIN 300 MG PO CAPS: 600.0000 mg | ORAL_CAPSULE | Freq: Two times a day (BID) | ORAL | 3 refills | Status: DC

## 2019-03-15 ENCOUNTER — Other Ambulatory Visit: Payer: Self-pay

## 2019-03-15 ENCOUNTER — Ambulatory Visit: Payer: 59 | Admitting: Family Medicine

## 2019-03-15 VITALS — BP 140/80 | HR 101 | Wt 251.2 lb

## 2019-03-15 DIAGNOSIS — L089 Local infection of the skin and subcutaneous tissue, unspecified: Secondary | ICD-10-CM | POA: Diagnosis not present

## 2019-03-15 DIAGNOSIS — E11628 Type 2 diabetes mellitus with other skin complications: Secondary | ICD-10-CM | POA: Diagnosis not present

## 2019-03-15 DIAGNOSIS — E1165 Type 2 diabetes mellitus with hyperglycemia: Secondary | ICD-10-CM

## 2019-03-15 LAB — POCT GLYCOSYLATED HEMOGLOBIN (HGB A1C): HbA1c, POC (controlled diabetic range): 11.6 % — AB (ref 0.0–7.0)

## 2019-03-15 MED ORDER — DOXYCYCLINE HYCLATE 100 MG PO TABS: 100.0000 mg | ORAL_TABLET | Freq: Two times a day (BID) | ORAL | 0 refills | Status: AC

## 2019-03-15 MED ORDER — INSULIN PEN NEEDLE 31G X 5 MM MISC: 6 refills | Status: DC

## 2019-03-15 NOTE — Assessment & Plan Note (Addendum)
Upon initial inspection, appears as parynochia and area scaled with 15 blade. Ultimately, no pockets of purulence appreciated. Good blood flow to great toe was reassuring.  - obtiain ABI's  - needs f/u DM appointment - obtaining labs today  - Oral doxycycline BID 14 days  - f/u in two weeks  - return precautions

## 2019-03-15 NOTE — Progress Notes (Signed)
Subjective  CC: toe discoloration and swelling  HPI:Virginia [name] is a 48 y.o. female who presents today with the following problems:  Toe discoloration Patient presents with left great toe purulence. Patient reports this has been going on for about a week. She noticed earlier this week that when she pressed down, blood and pus came out. She reported throbbing feeling so she raised her legs and took ibuprofen for pain relief. She has a history of diabetic neuropathy and poorly controlled IDDM2. Patient denies foul smell, fever, chills. She is still able to walk on   Pertinent PM/FHx: IDDM2 poorly controlled, diabetic neuropathy   Social Hx: Virginia [name]  reports that she quit smoking about 20 years ago. Her smoking use included cigarettes. She started smoking about 28 years ago. She has a 1.75 pack-year smoking history. She has never used smokeless tobacco. She reports that she does not drink alcohol or use drugs.  ROS: Pertinent ROS included in HPI. Objective  Physical Exam:  BP 140/80   Pulse (!) 101   Wt 251 lb 3.2 oz (113.9 kg)   SpO2 95%   BMI 40.54 kg/m  General: Well appearing female, NAD  Diabetic foot exam: Upon inspection, patient's right great toe is discolored, and nail appears hyperpigmented; however, nail bed is difficult to see due to nail polish. On palpation, there is crepitus and serosanguinous fluid that arises from medial nail fold. Patient does not have intact monofilament sensation in great toe, second toe and ball of foot. Other toes have intact sensation.  Left 4th toe is darkened with dried healed circular lesion directly over DIP on dorsal surface. No maceration between toes. No draining wounds.  Patient has a normal gait and joint mobility.  Patient has preserved great toe proprioception bilaterally.  Incision and Drainage Procedure Note PRE-OP DIAGNOSIS: diabetic foot infection   POST-OP DIAGNOSIS: Same  PROCEDURE: incision and drainage purulent pocket on left great  toe  Performing Physician: Zettie Cooley, MD Supervising Physician (if applicable): Dr. Andria Frames    PROCEDURE:  A timeout protocol was performed prior to initiating the procedure.  The area was prepared and draped in the usual, sterile manner. No anesthetic was required. A 15 lade was used to carefully shave calloused tissue where serosanguinous fluid was expressed. A linear incision along perimeter of medial nail fold. Did not appreciate any large pocket of fluid or any expressible material. Minimal blood loss. Hemostasis with pressure gauze and dressed with bandage and triple antibiotic ointment.  Followup: The patient tolerated the procedure well without complications.  Standard post-procedure care is explained and return precautions are given.  Assessment & Plan    Problem List Items Addressed This Visit      Active Problems   DM (diabetes mellitus), type 2, uncontrolled ([place])   Relevant Medications   Insulin Pen Needle 31G X 5 MM MISC   Other Relevant Orders   HgB A1c (Completed)   CMP (comprehensive metabolic panel)   Lipid Panel   VAS [place] ABI WITH/WO TBI   Diabetic foot infection ([place]) - Primary    Upon initial inspection, appears as parynochia and area scaled with 15 blade. Ultimately, no pockets of purulence appreciated. Good blood flow to great toe was reassuring.  - obtiain ABI's  - needs f/u DM appointment - obtaining labs today  - Oral doxycycline BID 14 days  - f/u in two weeks  - return precautions          Virginia [name], M.D.  3:81  PM 03/15/2019

## 2019-03-15 NOTE — Patient Instructions (Addendum)
Dear Virginia [name],   It was good to see you! Thank you for taking your time to come in to be seen. Today, we discussed the following:   toe discoloration and swelling   Wound care and antibiotics  Please see attachment for wound care   Please call us if you do not notice any improvement, the infection worsens, there is a bad smell, or you get a fever or chills   Diabetes and high blood pressure    Please make an appointment to follow up for these chronic issues as soon as possible   Please take your blood sugars every morning prior to eating or drinking so that we can best adjust your medications at your follow up visit   Please call your eye doctor to make an appointment to check for diabetic related eye issues    Be well,   Genia Hotter, M.D   [facility] [phone]  *Sign up for MyChart for instant access to your health profile, labs, orders, upcoming appointments or to contact your provider with questions*  ===================================================================================  Wound Care, Adult Taking care of your wound properly can help to prevent pain, infection, and scarring. It can also help your wound to heal more quickly. How to care for your wound Wound care      Follow instructions from your health care provider about how to take care of your wound. Make sure you: ? Wash your hands with soap and water before you change the bandage (dressing). If soap and water are not available, use hand sanitizer. ? Change your dressing as told by your health care provider. ? Leave stitches (sutures), skin glue, or adhesive strips in place. These skin closures may need to stay in place for 2 weeks or longer. If adhesive strip edges start to loosen and curl up, you may trim the loose edges. Do not remove adhesive strips completely unless your health care provider tells you to do that.  Check your wound area every day for signs of infection. Check  for: ? Redness, swelling, or pain. ? Fluid or blood. ? Warmth. ? Pus or a bad smell.  Ask your health care provider if you should clean the wound with mild soap and water. Doing this may include: ? Using a clean towel to pat the wound dry after cleaning it. Do not rub or scrub the wound. ? Applying a cream or ointment. Do this only as told by your health care provider. ? Covering the incision with a clean dressing.  Ask your health care provider when you can leave the wound uncovered.  Keep the dressing dry until your health care provider says it can be removed. Do not take baths, swim, use a hot tub, or do anything that would put the wound underwater until your health care provider approves. Ask your health care provider if you can take showers. You may only be allowed to take sponge baths. Medicines   If you were prescribed an antibiotic medicine, cream, or ointment, take or use the antibiotic as told by your health care provider. Do not stop taking or using the antibiotic even if your condition improves.  Take over-the-counter and prescription medicines only as told by your health care provider. If you were prescribed pain medicine, take it 30 or more minutes before you do any wound care or as told by your health care provider. General instructions  Return to your normal activities as told by your health care provider. Ask your  health care provider what activities are safe.  Do not scratch or pick at the wound.  Do not use any products that contain nicotine or tobacco, such as cigarettes and e-cigarettes. These may delay wound healing. If you need help quitting, ask your health care provider.  Keep all follow-up visits as told by your health care provider. This is important.  Eat a diet that includes protein, vitamin A, vitamin C, and other nutrient-rich foods to help the wound heal. ? Foods rich in protein include meat, dairy, beans, nuts, and other sources. ? Foods rich in  vitamin A include carrots and dark green, leafy vegetables. ? Foods rich in vitamin C include citrus, tomatoes, and other fruits and vegetables. ? Nutrient-rich foods have protein, carbohydrates, fat, vitamins, or minerals. Eat a variety of healthy foods including vegetables, fruits, and whole grains. Contact a health care provider if:  You received a tetanus shot and you have swelling, severe pain, redness, or bleeding at the injection site.  Your pain is not controlled with medicine.  You have redness, swelling, or pain around the wound.  You have fluid or blood coming from the wound.  Your wound feels warm to the touch.  You have pus or a bad smell coming from the wound.  You have a fever or chills.  You are nauseous or you vomit.  You are dizzy. Get help right away if:  You have a red streak going away from your wound.  The edges of the wound open up and separate.  Your wound is bleeding, and the bleeding does not stop with gentle pressure.  You have a rash.  You faint.  You have trouble breathing. Summary  Always wash your hands with soap and water before changing your bandage (dressing).  To help with healing, eat foods that are rich in protein, vitamin A, vitamin C, and other nutrients.  Check your wound every day for signs of infection. Contact your health care provider if you suspect that your wound is infected. This information is not intended to replace advice given to you by your health care provider. Make sure you discuss any questions you have with your health care provider. Document Released: 12/11/2007 Document Revised: 06/21/2018 Document Reviewed: 09/18/2015 Elsevier Patient Education  2020 [employer]  Diabetes Mellitus and Foot Care Foot care is an important part of your health, especially when you have diabetes. Diabetes may cause you to have problems because of poor blood flow (circulation) to your feet and legs, which can cause your skin  to:  Become thinner and drier.  Break more easily.  Heal more slowly.  Peel and crack. You may also have nerve damage (neuropathy) in your legs and feet, causing decreased feeling in them. This means that you may not notice minor injuries to your feet that could lead to more serious problems. Noticing and addressing any potential problems early is the best way to prevent future foot problems. How to care for your feet Foot hygiene  Wash your feet daily with warm water and mild soap. Do not use hot water. Then, pat your feet and the areas between your toes until they are completely dry. Do not soak your feet as this can dry your skin.  Trim your toenails straight across. Do not dig under them or around the cuticle. File the edges of your nails with an emery board or nail file.  Apply a moisturizing lotion or petroleum jelly to the skin on your feet and to dry,  brittle toenails. Use lotion that does not contain alcohol and is unscented. Do not apply lotion between your toes. Shoes and socks  Wear clean socks or stockings every day. Make sure they are not too tight. Do not wear knee-high stockings since they may decrease blood flow to your legs.  Wear shoes that fit properly and have enough cushioning. Always look in your shoes before you put them on to be sure there are no objects inside.  To break in new shoes, wear them for just a few hours a day. This prevents injuries on your feet. Wounds, scrapes, corns, and calluses  Check your feet daily for blisters, cuts, bruises, sores, and redness. If you cannot see the bottom of your feet, use a mirror or ask someone for help.  Do not cut corns or calluses or try to remove them with medicine.  If you find a minor scrape, cut, or break in the skin on your feet, keep it and the skin around it clean and dry. You may clean these areas with mild soap and water. Do not clean the area with peroxide, alcohol, or iodine.  If you have a wound,  scrape, corn, or callus on your foot, look at it several times a day to make sure it is healing and not infected. Check for: ? Redness, swelling, or pain. ? Fluid or blood. ? Warmth. ? Pus or a bad smell. General instructions  Do not cross your legs. This may decrease blood flow to your feet.  Do not use heating pads or hot water bottles on your feet. They may burn your skin. If you have lost feeling in your feet or legs, you may not know this is happening until it is too late.  Protect your feet from hot and cold by wearing shoes, such as at the beach or on hot pavement.  Schedule a complete foot exam at least once a year (annually) or more often if you have foot problems. If you have foot problems, report any cuts, sores, or bruises to your health care provider immediately. Contact a health care provider if:  You have a medical condition that increases your risk of infection and you have any cuts, sores, or bruises on your feet.  You have an injury that is not healing.  You have redness on your legs or feet.  You feel burning or tingling in your legs or feet.  You have pain or cramps in your legs and feet.  Your legs or feet are numb.  Your feet always feel cold.  You have pain around a toenail. Get help right away if:  You have a wound, scrape, corn, or callus on your foot and: ? You have pain, swelling, or redness that gets worse. ? You have fluid or blood coming from the wound, scrape, corn, or callus. ? Your wound, scrape, corn, or callus feels warm to the touch. ? You have pus or a bad smell coming from the wound, scrape, corn, or callus. ? You have a fever. ? You have a red line going up your leg. Summary  Check your feet every day for cuts, sores, red spots, swelling, and blisters.  Moisturize feet and legs daily.  Wear shoes that fit properly and have enough cushioning.  If you have foot problems, report any cuts, sores, or bruises to your health care  provider immediately.  Schedule a complete foot exam at least once a year (annually) or more often if you have foot problems.  This information is not intended to replace advice given to you by your health care provider. Make sure you discuss any questions you have with your health care provider. Document Released: 02/29/2000 Document Revised: 04/15/2017 Document Reviewed: 04/04/2016 Elsevier Patient Education  2020 [employer].

## 2019-03-16 ENCOUNTER — Ambulatory Visit (HOSPITAL_COMMUNITY)
Admission: RE | Admit: 2019-03-16 | Discharge: 2019-03-16 | Disposition: A | Discharge status: Home / Self Care | Payer: 59 | Source: Ambulatory Visit | Attending: Family Medicine | Admitting: Family Medicine

## 2019-03-16 DIAGNOSIS — E1165 Type 2 diabetes mellitus with hyperglycemia: Secondary | ICD-10-CM | POA: Diagnosis not present

## 2019-03-16 LAB — COMPREHENSIVE METABOLIC PANEL
ALT: 14 IU/L (ref 0–32)
AST: 10 IU/L (ref 0–40)
Albumin/Globulin Ratio: 1.2 (ref 1.2–2.2)
Albumin: 4.1 g/dL (ref 3.8–4.8)
Alkaline Phosphatase: 191 IU/L — ABNORMAL HIGH (ref 39–117)
BUN/Creatinine Ratio: 32 — ABNORMAL HIGH (ref 9–23)
BUN: 29 mg/dL — ABNORMAL HIGH (ref 6–24)
Bilirubin Total: <0.2 mg/dL (ref 0.0–1.2)
CO2: 23 mmol/L (ref 20–29)
Calcium: 9.2 mg/dL (ref 8.7–10.2)
Chloride: 103 mmol/L (ref 96–106)
Creatinine, Ser: 0.92 mg/dL (ref 0.57–1.00)
GFR calc Af Amer: 85 mL/min/{1.73_m2} (ref >59)
GFR calc non Af Amer: 73 mL/min/{1.73_m2} (ref >59)
Globulin, Total: 3.4 g/dL (ref 1.5–4.5)
Glucose: 245 mg/dL — ABNORMAL HIGH (ref 65–99)
Potassium: 4.3 mmol/L (ref 3.5–5.2)
Sodium: 141 mmol/L (ref 134–144)
Total Protein: 7.5 g/dL (ref 6.0–8.5)

## 2019-03-16 LAB — LIPID PANEL
Chol/HDL Ratio: 2.5 ratio (ref 0.0–4.4)
Cholesterol, Total: 108 mg/dL (ref 100–199)
HDL: 43 mg/dL (ref >39)
LDL Chol Calc (NIH): 51 mg/dL (ref 0–99)
Triglycerides: 65 mg/dL (ref 0–149)
VLDL Cholesterol Cal: 14 mg/dL (ref 5–40)

## 2019-03-25 ENCOUNTER — Ambulatory Visit: Payer: 59 | Admitting: Student in an Organized Health Care Education/Training Program

## 2019-03-28 ENCOUNTER — Other Ambulatory Visit: Payer: Self-pay

## 2019-03-28 ENCOUNTER — Encounter: Payer: Self-pay | Admitting: Family Medicine

## 2019-03-28 ENCOUNTER — Ambulatory Visit: Payer: 59 | Admitting: Family Medicine

## 2019-03-28 DIAGNOSIS — E1165 Type 2 diabetes mellitus with hyperglycemia: Secondary | ICD-10-CM

## 2019-03-28 DIAGNOSIS — L089 Local infection of the skin and subcutaneous tissue, unspecified: Secondary | ICD-10-CM

## 2019-03-28 DIAGNOSIS — E11628 Type 2 diabetes mellitus with other skin complications: Secondary | ICD-10-CM

## 2019-03-28 NOTE — Progress Notes (Signed)
   Subjective:    Patient ID: Virginia [name], female    DOB: [birth year], 49 y.o.   MRN: [id number]   CC: Virginia [name] is a 49 yr old female who presents today for a follow up for diabetic foot infection  HPI:  Diabetes foot infection  Pt has background of poorly controled IDDM and Diabetic neuropathy and seen in clinic on 12/29 by Dr Selena Batten for left toe purulence, discoloration, pain and erythema. I&D was performed but no pockets of purulence were appreciated. Treated with 14 day course of Doxycycline. ABI ordered which were normal in both toes. Presents today for f/u as discoloration of the toe is about the same. The swelling has improved around the toe but has on going unilateral swelling and pain of left foot. Is concerned why it has not improved. Did report she was involved in trauma a few months ago requiring orthopedics at [facility] referral. No calf tenderness of either calf. No calf swelling.   Diabetes  Pt has been taking Jardiance 10mg  and Lantus 40 units once nightly. Tolerating well with no side effects. . Fasting CBGs are usually 89-198. Has hypoglycemic symptoms when CBGs <80/90. Has done her best to cut back on sugary foods and adopt a healthier diet but feel like she could use some help with nutrition. Yesterday ate bbq food, sandwhich and some strawberries. Has tried working with dietician before but has helped much. Would like to see a dietician again.  Smoking status reviewed   ROS: pertinent noted in the HPI    Past medical history, surgical, family, and social history reviewed and updated in the EMR as appropriate. Reviewed problem list.   Objective:  BP 124/74   Pulse (!) 101   Wt 258 lb 3.2 oz (117.1 kg)   SpO2 100%   BMI 41.67 kg/m   Vitals and nursing note reviewed  General: NAD, pleasant, able to participate in exam Extremities: no edema or cyanosis. Skin: warm and dry, no rashes noted Foot: on inspection there is gross dark discoloration of first and second toe  with evidence of erythema and swelling. Left foot grossly edematous. On palpation tenderness over left toe. Normal active ROM of feet. Normal strength and sensation bilaterally.            Assessment & Plan:    DM (diabetes mellitus), type 2, uncontrolled (HCC) Poorly controlled diabetes with Lantus. Recommended that patient increase Lantus by 1 unit every time CBG is over 150 and maintained at the dose where CBGs remain under 150. Recommended that patient keep CBG diary at home. Follow-up with PCP for A1c in 2 to 3 months. Provided patient with DM diet nutrition counseling and offered patient referral to dietitian: Dr. and was enthusiastic about this. Will refer to Dr. Gerilyn Pilgrim for further nutritional assessment and management of her diabetes.  Diabetic foot infection (HCC) Etiology of toe discoloration, pain and edema is unclear. ABIs normal so low suspicion for peripheral artery disease. DVT less likely as swelling is in the foot and there is no calf tenderness. Given that patient sustained trauma to this area in the recent months and has been seen by orthopedics at [facility], recommended patient contact them again regarding her foot symptoms.Can consider referral to podiatry if follow-up with orthopedics is unsuccessful.   [facility], MD  [facility] PGY-1

## 2019-03-28 NOTE — Patient Instructions (Addendum)
  Diet Recommendations for Diabetes   Starchy (carb) foods: Bread, rice, pasta, potatoes, corn, cereal, grits, crackers, bagels, muffins, all baked goods.  (Fruits, milk, and yogurt also have carbohydrate, but most of these foods will not spike your blood sugar as most starchy foods will.)  A few fruits do cause high blood sugars; use small portions of bananas (limit to 1/2 at a time), grapes, watermelon, oranges, and most tropical fruits.    Protein foods: Meat, fish, poultry, eggs, dairy foods, and beans such as pinto and kidney beans (beans also provide carbohydrate).   1. Eat at least 3 meals and 1-2 snacks per day. Never go more than 4-5 hours while awake without eating. Eat breakfast within the first hour of getting up.   2. Limit starchy foods to TWO per meal and ONE per snack. ONE portion of a starchy  food is equal to the following:   - ONE slice of bread (or its equivalent, such as half of a hamburger bun).   - 1/2 cup of a "scoopable" starchy food such as potatoes or rice.   - 15 grams of Total Carbohydrate as shown on food label.  3. Include at every meal: a protein food, a carb food, and vegetables and/or fruit.   - Obtain twice the volume of veg's as protein or carbohydrate foods for both lunch and dinner.   - Fresh or frozen veg's are best.   - Keep frozen veg's on hand for a quick vegetable serving.     Increase insulin by 1 unit every day if your blood sugars are above 150. Keep increasing by 1 unit until sugars are below 150. Please keep diary of sugars. Please come back in March for a diabetic check. I will refer you to our dietician to help you with your diet. Please see orthopedics for your foot swelling. Keep feet moisturized with Vaseline.  Kind regards,  Dr Allena Katz

## 2019-03-31 NOTE — Assessment & Plan Note (Addendum)
Poorly controlled diabetes with Lantus. Recommended that patient increase Lantus by 1 unit every time CBG is over 150 and maintained at the dose where CBGs remain under 150. Recommended that patient keep CBG diary at home. Follow-up with PCP for A1c in 2 to 3 months. Provided patient with DM diet nutrition counseling and offered patient referral to dietitian: Dr. Gerilyn Pilgrim and was enthusiastic about this. Will refer to Dr. Gerilyn Pilgrim for further nutritional assessment and management of her diabetes.

## 2019-03-31 NOTE — Assessment & Plan Note (Signed)
Etiology of toe discoloration, pain and edema is unclear. ABIs normal so low suspicion for peripheral artery disease. DVT less likely as swelling is in the foot and there is no calf tenderness. Given that patient sustained trauma to this area in the recent months and has been seen by orthopedics at [facility], recommended patient contact them again regarding her foot symptoms.Can consider referral to podiatry if follow-up with orthopedics is unsuccessful.

## 2019-04-01 ENCOUNTER — Other Ambulatory Visit: Payer: Self-pay | Admitting: Family Medicine

## 2019-04-01 DIAGNOSIS — E1165 Type 2 diabetes mellitus with hyperglycemia: Secondary | ICD-10-CM

## 2019-04-04 ENCOUNTER — Other Ambulatory Visit: Payer: Self-pay | Admitting: *Deleted

## 2019-04-04 DIAGNOSIS — I10 Essential (primary) hypertension: Secondary | ICD-10-CM

## 2019-04-04 MED ORDER — HYDROCHLOROTHIAZIDE 25 MG PO TABS: 25.0000 mg | ORAL_TABLET | Freq: Every day | ORAL | 3 refills | Status: DC

## 2019-04-04 NOTE — Telephone Encounter (Signed)
Patient needs a refill on her HCTZ.  Johncarlo Maalouf,CMA

## 2019-04-06 ENCOUNTER — Encounter: Payer: Self-pay | Admitting: Podiatry

## 2019-04-06 ENCOUNTER — Ambulatory Visit (INDEPENDENT_AMBULATORY_CARE_PROVIDER_SITE_OTHER): Payer: 59

## 2019-04-06 ENCOUNTER — Ambulatory Visit: Payer: 59 | Admitting: Podiatry

## 2019-04-06 ENCOUNTER — Other Ambulatory Visit: Payer: Self-pay

## 2019-04-06 DIAGNOSIS — E11628 Type 2 diabetes mellitus with other skin complications: Secondary | ICD-10-CM | POA: Diagnosis not present

## 2019-04-06 DIAGNOSIS — M2062 Acquired deformities of toe(s), unspecified, left foot: Secondary | ICD-10-CM

## 2019-04-06 DIAGNOSIS — M79671 Pain in right foot: Secondary | ICD-10-CM

## 2019-04-06 DIAGNOSIS — M869 Osteomyelitis, unspecified: Secondary | ICD-10-CM | POA: Diagnosis not present

## 2019-04-06 DIAGNOSIS — E1351 Other specified diabetes mellitus with diabetic peripheral angiopathy without gangrene: Secondary | ICD-10-CM

## 2019-04-06 NOTE — Patient Instructions (Signed)
Pre-Operative Instructions  Congratulations, you have decided to take an important step towards improving your quality of life.  You can be assured that the doctors and staff at [facility] will be with you every step of the way.  Here are some important things you should know:  1. Plan to be at the surgery center/hospital at least 1 (one) hour prior to your scheduled time, unless otherwise directed by the surgical center/hospital staff.  You must have a responsible adult accompany you, remain during the surgery and drive you home.  Make sure you have directions to the surgical center/hospital to ensure you arrive on time. 2. If you are having surgery at [facility] or [facility] hospitals, you will need a copy of your medical history and physical form from your family physician within one month prior to the date of surgery. We will give you a form for your primary physician to complete.  3. We make every effort to accommodate the date you request for surgery.  However, there are times where surgery dates or times have to be moved.  We will contact you as soon as possible if a change in schedule is required.   4. No aspirin/ibuprofen for one week before surgery.  If you are on aspirin, any non-steroidal anti-inflammatory medications (Mobic, Aleve, Ibuprofen) should not be taken seven (7) days prior to your surgery.  You make take Tylenol for pain prior to surgery.  5. Medications - If you are taking daily heart and blood pressure medications, seizure, reflux, allergy, asthma, anxiety, pain or diabetes medications, make sure you notify the surgery center/hospital before the day of surgery so they can tell you which medications you should take or avoid the day of surgery. 6. No food or drink after midnight the night before surgery unless directed otherwise by surgical center/hospital staff. 7. No alcoholic beverages 24-hours prior to surgery.  No smoking 24-hours prior or 24-hours after  surgery. 8. Wear loose pants or shorts. They should be loose enough to fit over bandages, boots, and casts. 9. Don't wear slip-on shoes. Sneakers are preferred. 10. Bring your boot with you to the surgery center/hospital.  Also bring crutches or a walker if your physician has prescribed it for you.  If you do not have this equipment, it will be provided for you after surgery. 11. If you have not been contacted by the surgery center/hospital by the day before your surgery, call to confirm the date and time of your surgery. 12. Leave-time from work may vary depending on the type of surgery you have.  Appropriate arrangements should be made prior to surgery with your employer. 13. Prescriptions will be provided immediately following surgery by your doctor.  Fill these as soon as possible after surgery and take the medication as directed. Pain medications will not be refilled on weekends and must be approved by the doctor. 14. Remove nail polish on the operative foot and avoid getting pedicures prior to surgery. 15. Wash the night before surgery.  The night before surgery wash the foot and leg well with water and the antibacterial soap provided. Be sure to pay special attention to beneath the toenails and in between the toes.  Wash for at least three (3) minutes. Rinse thoroughly with water and dry well with a towel.  Perform this wash unless told not to do so by your physician.  Enclosed: 1 Ice pack (please put in freezer the night before surgery)   1 Hibiclens skin cleaner     Pre-op instructions  If you have any questions regarding the instructions, please do not hesitate to call our office.  [place]: [address], [place], [place] [address] -- [phone]  [place]: [address] [place], [place] [address] -- [phone]  [place]: [address], [place],  [address] -- [phone]   Website: [email or web address] 

## 2019-04-07 ENCOUNTER — Other Ambulatory Visit: Payer: Self-pay | Admitting: Podiatry

## 2019-04-07 ENCOUNTER — Telehealth: Payer: Self-pay | Admitting: *Deleted

## 2019-04-07 ENCOUNTER — Encounter: Payer: Self-pay | Admitting: Podiatry

## 2019-04-07 DIAGNOSIS — E1351 Other specified diabetes mellitus with diabetic peripheral angiopathy without gangrene: Secondary | ICD-10-CM

## 2019-04-07 DIAGNOSIS — M869 Osteomyelitis, unspecified: Secondary | ICD-10-CM

## 2019-04-07 DIAGNOSIS — E11628 Type 2 diabetes mellitus with other skin complications: Secondary | ICD-10-CM

## 2019-04-07 NOTE — Telephone Encounter (Signed)
-----   Message from Candelaria Stagers, DPM sent at 04/07/2019 10:46 AM EST ----- Regarding: Vascular follow up Hi Said Rueb,  Can he have this patient follow with Dr. Ruthy Dick for vascular work-up.  Thank you

## 2019-04-07 NOTE — Telephone Encounter (Signed)
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 Call Ref# [id number]

## 2019-04-07 NOTE — Telephone Encounter (Signed)
Pt states she needs the antibiotic called in.

## 2019-04-07 NOTE — Telephone Encounter (Signed)
Faxed referral, clinicals and demographics to [facility]. 

## 2019-04-07 NOTE — Progress Notes (Signed)
Subjective:  Patient ID: Virginia [name], female    DOB: 11/03/1970,  MRN: [id number]  Chief Complaint  Patient presents with  . Toe Pain    pt is here for left great hallux, turning black, pt states it has been going on for a couple of weeks, pt states it is painful to the touch, and a states that it is most aggravated when getting off of work.    49 y.o. female presents with the above complaint.  Patient presents with a left great toe infection which has started turning gangrenous in nature.  Patient states she is a diabetic with last A1c of 11.6.  She states that she is try to bring it down but has not been very successful.  Patient states the swelling has been going on for about 3 weeks.  She states that the pain is elevated when she gets off work.  There is a throbbing sensation.  Pain scale is 3 out of 10.  She denies any purulent drainage that was expressed.  She denies any nausea fever chills vomiting.  I believe patient will also benefit from a vascular follow-up given her last ABIs PVR showed the left ABI was noncompressible.   Review of Systems: Negative except as noted in the HPI. Denies N/V/F/Ch.  Past Medical History:  Diagnosis Date  . Diabetes mellitus   . Hypertension    on HCTZ  . Obesity   . Wears glasses     Current Outpatient Medications:  .  Alcohol Swabs (ALCOHOL PADS) 70 % PADS, 1 each by Does not apply route as directed., Disp: 90 each, Rfl: 3 .  atorvastatin (LIPITOR) 40 MG tablet, Take 1 tablet (40 mg total) by mouth daily., Disp: 90 tablet, Rfl: 3 .  Blood Glucose Monitoring Suppl (ONETOUCH VERIO) w/Device KIT, 1 kit by Does not apply route daily., Disp: 1 kit, Rfl: 1 .  empagliflozin (JARDIANCE) 25 MG TABS tablet, Take 25 mg by mouth daily., Disp: 90 tablet, Rfl: 3 .  gabapentin (NEURONTIN) 300 MG capsule, Take 2 capsules (600 mg total) by mouth 2 (two) times daily., Disp: 120 capsule, Rfl: 3 .  glucose blood (ONETOUCH VERIO) test strip, Use 3 times daily, Disp:  100 each, Rfl: 12 .  hydrochlorothiazide (HYDRODIURIL) 25 MG tablet, Take 1 tablet (25 mg total) by mouth daily., Disp: 90 tablet, Rfl: 3 .  Insulin Glargine (BASAGLAR KWIKPEN) 100 UNIT/ML SOPN, INJECT 40 UNITS SUBCUTANEOUSLY ONCE DAILY, Disp: 15 mL, Rfl: 0 .  Insulin Pen Needle 31G X 5 MM MISC, Use one needle per application, Disp: 320 each, Rfl: 6 .  irbesartan (AVAPRO) 75 MG tablet, Take 1 tablet (75 mg total) by mouth daily., Disp: 90 tablet, Rfl: 3 .  meloxicam (MOBIC) 15 MG tablet, Take 15 mg by mouth daily., Disp: , Rfl:  .  Multiple Vitamins-Minerals (MULTIVITAMIN WITH MINERALS) tablet, Take 1 tablet by mouth daily. Reported on 07/09/2015, Disp: , Rfl:  .  ONETOUCH DELICA LANCETS FINE MISC, 1 each by Does not apply route 2 (two) times daily., Disp: 100 each, Rfl: 5  Social History   Tobacco Use  Smoking Status Former Smoker  . Packs/day: 0.25  . Years: 7.00  . Pack years: 1.75  . Types: Cigarettes  . Start date: 03/18/1991  . Quit date: 05/03/1998  . Years since quitting: 20.9  Smokeless Tobacco Never Used  Tobacco Comment   Works at [employer] does NOT smoke.     Allergies  Allergen Reactions  .  Liraglutide Nausea And Vomiting    Projectile Vomiting  . Shrimp [Shellfish Allergy] Shortness Of Breath  . Ace Inhibitors Cough  . Metformin And Related Diarrhea and Nausea And Vomiting  . Rosuvastatin Other (See Comments)    Myalgia - Legs reported - resolved upon discontinuation.    Objective:  There were no vitals filed for this visit. There is no height or weight on file to calculate BMI. Constitutional Well developed. Well nourished.  Vascular Dorsalis pedis non  pulses palpable bilaterally. Posterior tibial non pulses palpable bilaterally. Capillary refill normal to all digits.  No cyanosis or clubbing noted. Pedal hair growth normal.  Neurologic Normal speech. Oriented to person, place, and time. Epicritic sensation to light touch grossly present bilaterally.    Dermatologic Nails well groomed and normal in appearance. No open wounds. No skin lesions.  Orthopedic:  Mild pain on palpation to the left great toe.  There is no purulent drainage was expressed.  However the beginning stages of gangrenous changes noted to the left great toe.   Radiographs: 3 views of skeletally mature adult foot.  There is cortical destruction noted of the distal phalanx consistent with osteomyelitis of the distal phalanx of the left great toe.  No soft tissue emphysema noted.  No foreign body noted. Assessment:   1. Pain in right foot   2. Acquired deformity of joint of big toe of left foot   3. Osteomyelitis of left foot, unspecified type ([place])   4. 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 Osteomyelitis of great toe of left foot ([place])   6. Peripheral vascular disease due to secondary diabetes [facility])    Plan:  Patient was evaluated and treated and all questions answered.  Left hallux osteomyelitis -I explained to the patient that given that there is acute osteomyelitis with underlying swelling there are radiographically present I believe patient will need hallux amputation to prevent the spreading of the infection.  I went over the x-rays and reviewed all the chart material with the patient and I believe that patient will benefit from a hallux amputation to control the infection.  However given that there is no other clinical signs of infection except for osteomyelitis this is not an emergent or urgent situation, I plan on doing this procedure as an outpatient.  I explained to the patient that if she starts noticing any clinical signs of infection including erythema purulent drainage or worsening of pain call me right away or go to the emergency room. -I believe patient will also benefit from a vascular follow-up given her most recent vascular study showed possibly noncompressible arteries to the left lower extremity.  I will have patient  follow-up with my colleague Dr. Damita Dunnings for further vascular work-up. -In the interim I plan on performing the hallux amputation with primary closure of the left side.  Patient agrees with the plan and would like to proceed with surgical amputation.  Given that patient has a high A1c I have asked her to control her glucose as much as possible as there is a risk of dehiscence after the procedure.  Patient states understanding. -Informed surgical risk consent was reviewed and read aloud to the patient.  I reviewed the films.  I have discussed my findings with the patient in great detail.  I have discussed all risks including but not limited to infection, stiffness, scarring, limp, disability, deformity, damage to blood vessels and nerves, numbness, poor healing, need for braces, arthritis, chronic  pain, amputation, death.  All benefits and realistic expectations discussed in great detail.  I have made no promises as to the outcome.  I have provided realistic expectations.  I have offered the patient a 2nd opinion, which they have declined and assured me they preferred to proceed despite the risks   No follow-ups on file.

## 2019-04-08 ENCOUNTER — Encounter: Payer: Self-pay | Admitting: Podiatry

## 2019-04-08 MED ORDER — DOXYCYCLINE HYCLATE 100 MG PO TABS: 100.0000 mg | ORAL_TABLET | Freq: Two times a day (BID) | ORAL | 0 refills | Status: DC

## 2019-04-08 NOTE — Telephone Encounter (Signed)
It wasn't clically infected when I saw her in clinic. However, I have sent doxy to her pharmacy. Thanks

## 2019-04-08 NOTE — Telephone Encounter (Signed)
Left message informing pt of Dr. Eliane Decree orders.

## 2019-04-09 LAB — WOUND CULTURE
MICRO NUMBER:: 10061734
RESULT:: NO GROWTH
SPECIMEN QUALITY:: ADEQUATE

## 2019-04-11 ENCOUNTER — Encounter: Payer: Self-pay | Admitting: Podiatry

## 2019-04-11 ENCOUNTER — Other Ambulatory Visit: Payer: Self-pay | Admitting: Podiatry

## 2019-04-11 ENCOUNTER — Telehealth: Payer: Self-pay | Admitting: *Deleted

## 2019-04-11 DIAGNOSIS — M86672 Other chronic osteomyelitis, left ankle and foot: Secondary | ICD-10-CM | POA: Diagnosis not present

## 2019-04-11 MED ORDER — OXYCODONE-ACETAMINOPHEN 10-325 MG PO TABS: 1.0000 | ORAL_TABLET | Freq: Four times a day (QID) | ORAL | 0 refills | Status: DC | PRN

## 2019-04-11 MED ORDER — OXYCODONE-ACETAMINOPHEN 5-325 MG PO TABS: 1.0000 | ORAL_TABLET | Freq: Four times a day (QID) | ORAL | 0 refills | Status: DC | PRN

## 2019-04-11 NOTE — Addendum Note (Signed)
Addended by: Nicholes Rough on: 04/11/2019 10:19 AM   Modules accepted: Orders

## 2019-04-11 NOTE — Telephone Encounter (Signed)
[facility] - Pharmacist-Wynn states the Percocet 10/325mg  is to and needs to be lowered, and needs a new prescription sent in.

## 2019-04-11 NOTE — Telephone Encounter (Signed)
I spoke with Virginia [name] and she states could cut dosage to 3 times a day and would decrease the mEq, and should be escribed again.

## 2019-04-11 NOTE — Telephone Encounter (Signed)
Pt called states she is at the pharmacy and her medication dose is too high.

## 2019-04-11 NOTE — Telephone Encounter (Signed)
I see Dr. Allena Katz redid it as a lower dose. Do I still need to do anything?

## 2019-04-11 NOTE — Telephone Encounter (Signed)
Pt's husband called and I told him Dr. Allena Katz was still not in the office but I would send a message.

## 2019-04-11 NOTE — Progress Notes (Signed)
err

## 2019-04-11 NOTE — Telephone Encounter (Signed)
I informed pt Dr. Allena Katz had changed the pain medication and sent to the [facility].

## 2019-04-18 ENCOUNTER — Telehealth: Payer: 59 | Admitting: Family Medicine

## 2019-04-18 ENCOUNTER — Other Ambulatory Visit: Payer: Self-pay

## 2019-04-19 ENCOUNTER — Other Ambulatory Visit: Payer: Self-pay | Admitting: Family Medicine

## 2019-04-19 ENCOUNTER — Ambulatory Visit
Admission: RE | Admit: 2019-04-19 | Discharge: 2019-04-19 | Disposition: A | Discharge status: Home / Self Care | Payer: 59 | Source: Ambulatory Visit | Attending: Podiatry | Admitting: Podiatry

## 2019-04-19 ENCOUNTER — Encounter: Payer: Self-pay | Admitting: *Deleted

## 2019-04-19 DIAGNOSIS — E1351 Other specified diabetes mellitus with diabetic peripheral angiopathy without gangrene: Secondary | ICD-10-CM

## 2019-04-19 DIAGNOSIS — E11628 Type 2 diabetes mellitus with other skin complications: Secondary | ICD-10-CM

## 2019-04-19 DIAGNOSIS — M869 Osteomyelitis, unspecified: Secondary | ICD-10-CM

## 2019-04-19 HISTORY — PX: IR RADIOLOGIST EVAL & MGMT: IMG5224

## 2019-04-19 NOTE — Consult Note (Signed)
Chief Complaint: Left Great Toe Osteomyelitis  Referring Physician(s): Patel,Kevin P  History of Present Illness: Virginia [name] is a 49 y.o. female presenting as a scheduled consultation to [facility] today, kindly referred by Dr. Posey Pronto, for evaluation of CLI/CLTI.   Virginia [name] joins [place] today for the visit by herself.   She tells me that her left great toe wound started about 1 month ago, and she followed up quickly with [facility].  She underwent a left great toe amputation about 1 week ago Monday for osteomyelitis.    Her first post-op visit is tomorrow on Wednesday.    She reports that she has had no prior wound of the lower extremity.  She says she had a right fourth toe color change but no wound, and that the darkening she observed got better.   I cannot elicit any history of claudication.    Her CV risk factors include: DM, HTN, remote smoking history, quit 20 years ago.    She has never had MI or stroke.    Her PCP is [facility].    Non-invasive study performed today shows non-compressible ABI and pressures, with normal doppler and PVR to the ankles.    Past Medical History:  Diagnosis Date  . Diabetes mellitus   . Hypertension    on HCTZ  . Obesity   . Wears glasses     Past Surgical History:  Procedure Laterality Date  . CHOLECYSTECTOMY    . SHOULDER ARTHROSCOPY WITH ROTATOR CUFF REPAIR AND SUBACROMIAL DECOMPRESSION Left 06/03/2013   Procedure: LEFT SHOULDER ARTHROSCOPY DEBRIDEMENT EXTENTSIVE/SUBACROMIAL DECOMPRESSION/PARTIAL ACROMIOPLASTY DISTAL CLAVICLE RESECTION WITH CORACOACROMIAL RELEASE/ ROTATOR CUFF REPAIR ;  Surgeon: Johnny Bridge, MD;  Location: [facility];  Service: Orthopedics;  Laterality: Left;  ANESTHESIA: GENERAL, PRE/POST OP SCALENE  . TUBAL LIGATION      Allergies: Liraglutide, Shrimp [shellfish allergy], Ace inhibitors, Metformin and related, and Rosuvastatin  Medications: Prior to Admission  medications   Medication Sig Start Date End Date Taking? Authorizing Provider  Alcohol Swabs (ALCOHOL PADS) 70 % PADS 1 each by Does not apply route as directed. 03/31/18   Bufford Lope, DO  atorvastatin (LIPITOR) 40 MG tablet Take 1 tablet (40 mg total) by mouth daily. 12/23/18   Autry-Lott, Naaman Plummer, DO  Blood Glucose Monitoring Suppl (ONETOUCH VERIO) w/Device KIT 1 kit by Does not apply route daily. 08/22/16   Carlyle Dolly, MD  doxycycline (VIBRA-TABS) 100 MG tablet Take 1 tablet (100 mg total) by mouth 2 (two) times daily. 04/08/19   Felipa Furnace, DPM  empagliflozin (JARDIANCE) 25 MG TABS tablet Take 25 mg by mouth daily. 01/13/19   Autry-Lott, Naaman Plummer, DO  gabapentin (NEURONTIN) 300 MG capsule Take 2 capsules (600 mg total) by mouth 2 (two) times daily. 02/02/19   Autry-Lott, Naaman Plummer, DO  glucose blood (ONETOUCH VERIO) test strip Use 3 times daily 12/21/17   Bufford Lope, DO  hydrochlorothiazide (HYDRODIURIL) 25 MG tablet Take 1 tablet (25 mg total) by mouth daily. 04/04/19   Autry-Lott, Naaman Plummer, DO  Insulin Glargine (BASAGLAR KWIKPEN) 100 UNIT/ML SOPN INJECT 40 UNITS SUBCUTANEOUSLY ONCE DAILY 04/01/19   Autry-Lott, Naaman Plummer, DO  Insulin Pen Needle 31G X 5 MM MISC Use one needle per application 40/98/11   Wilber Oliphant, MD  irbesartan (AVAPRO) 75 MG tablet Take 1 tablet (75 mg total) by mouth daily. 02/17/18   Bufford Lope, DO  meloxicam (MOBIC) 15 MG tablet Take  15 mg by mouth daily. 03/15/19   [provider]  Multiple Vitamins-Minerals (MULTIVITAMIN WITH MINERALS) tablet Take 1 tablet by mouth daily. Reported on 07/09/2015    [provider]  [place] 1 each by Does not apply route 2 (two) times daily. 12/21/17   Bufford Lope, DO  oxyCODONE-acetaminophen (PERCOCET) 5-325 MG tablet Take 1 tablet by mouth every 6 (six) hours as needed for severe pain. 04/11/19   Felipa Furnace, DPM     Family History  Problem Relation Age of Onset  . Diabetes Mother   .  Heart disease Mother   . Hypertension Mother   . Hyperlipidemia Mother   . Cancer Mother   . Breast cancer Mother   . Diabetes Father   . Heart disease Father   . Hypertension Father   . Hyperlipidemia Father   . Breast cancer Maternal Aunt     Social History   Socioeconomic History  . Marital status: Married    Spouse name: Not on file  . Number of children: Not on file  . Years of education: Not on file  . Highest education level: Not on file  Occupational History    Comment: Runs cigarette making machine  Tobacco Use  . Smoking status: Former Smoker    Packs/day: 0.25    Years: 7.00    Pack years: 1.75    Types: Cigarettes    Start date: 03/18/1991    Quit date: 05/03/1998    Years since quitting: 20.9  . Smokeless tobacco: Never Used  . Tobacco comment: Works at [employer] does NOT smoke.   Substance and Sexual Activity  . Alcohol use: No    Comment: rare  . Drug use: No  . Sexual activity: Not on file  Other Topics Concern  . Not on file  Social History Narrative  . Not on file   Social Determinants of Health   Financial Resource Strain:   . Difficulty of Paying Living Expenses: Not on file  Food Insecurity:   . Worried About Charity fundraiser in the Last Year: Not on file  . Ran Out of Food in the Last Year: Not on file  Transportation Needs:   . Lack of Transportation (Medical): Not on file  . Lack of Transportation (Non-Medical): Not on file  Physical Activity:   . Days of Exercise per Week: Not on file  . Minutes of Exercise per Session: Not on file  Stress:   . Feeling of Stress : Not on file  Social Connections:   . Frequency of Communication with Friends and Family: Not on file  . Frequency of Social Gatherings with Friends and Family: Not on file  . Attends Religious Services: Not on file  . Active Member of Clubs or Organizations: Not on file  . Attends Archivist Meetings: Not on file  . Marital Status: Not on file       Review of Systems: A 12 point ROS discussed and pertinent positives are indicated in the HPI above.  All other systems are negative.  Review of Systems  Vital Signs: BP 140/75 (BP Location: Left Arm)   Pulse 90   Temp 98 F (36.7 C)   SpO2 97%   Physical Exam General: 49 yo female appearing stated age.  Well-developed, well-nourished.  No distress. HEENT: Atraumatic, normocephalic.  Conjugate gaze, extra-ocular motor intact. No scleral icterus or scleral injection. No lesions on external ears, nose, lips,  or gums.  Oral mucosa moist, pink.  Neck: Symmetric with no goiter enlargement.  Chest/Lungs:  Symmetric chest with inspiration/expiration.  No labored breathing.  Clear to auscultation with no wheezes, rhonchi, or rales.  Heart:  RRR, with no third heart sounds appreciated. No JVD appreciated.  Abdomen:  Soft, NT/ND, with + bowel sounds.   Genito-urinary: Deferred Neurologic: Alert & Oriented to person, place, and time.   Normal affect and insight.  Appropriate questions.  Moving all 4 extremities with gross sensory intact.  Pulse Exam:   Doppler maintained bilateral PT/AT Extremities: Surgical dressing in place on the left great toe.    Mallampati Score:     Imaging: US ARTERIAL SEG MULTIPLE LE (ABI, SEGMENTAL PRESSURES, PVR'S)  Result Date: 04/19/2019 CLINICAL DATA:  49 year old female with a history of left great toe wound, amputation left great toe 1 week ago EXAM: NONINVASIVE PHYSIOLOGIC VASCULAR STUDY OF BILATERAL LOWER EXTREMITIES TECHNIQUE: Evaluation of both lower extremities was performed at rest, including calculation of ankle-brachial indices, multiple segmental pressure evaluation, segmental Doppler and segmental pulse volume recording. COMPARISON:  None. FINDINGS: Right: Resting ankle brachial index:  Non calculable Segmental blood pressure: Upper extremity pressures are symmetric. Noncompressible arteries throughout thigh, calf, ankles Doppler: Triphasic waveforms  throughout the right lower extremity Pulse volume recording: Segmental PVR demonstrates waveforms maintained throughout the right lower extremity. Left: Resting ankle brachial index: Non calculable Segmental blood pressure: Upper extremity pressures are symmetric. Noncompressible arteries of the thigh, calf, ankle Doppler: Triphasic waveforms throughout the left lower extremity Pulse volume recording: Segmental PVR demonstrates waveforms maintained throughout the left lower extremity Additional: IMPRESSION: Resting ABI of the bilateral lower extremity are non calculable. The segmental exam demonstrates waveforms maintained throughout the bilateral lower extremities, with no evidence of significant arterial occlusive disease to the ankles. Signed, Dulcy Fanny. Dellia Nims, RPVI [facility] Electronically Signed   By: Corrie Mckusick D.O.   On: 04/19/2019 10:42   DG Foot Complete Left  Result Date: 04/06/2019 Please see detailed radiograph report in office note.   Labs:  CBC: No results for input(s): WBC, HGB, HCT, PLT in the last 8760 hours.  COAGS: No results for input(s): INR, APTT in the last 8760 hours.  BMP: Recent Labs    03/15/19 1033  NA 141  K 4.3  CL 103  CO2 23  GLUCOSE 245*  BUN 29*  CALCIUM 9.2  CREATININE 0.92  GFRNONAA 73  GFRAA 85    LIVER FUNCTION TESTS: Recent Labs    03/15/19 1033  BILITOT <0.2  AST 10  ALT 14  ALKPHOS 191*  PROT 7.5  ALBUMIN 4.1    TUMOR MARKERS: No results for input(s): AFPTM, CEA, CA199, CHROMGRNA in the last 8760 hours.    Assessment:  Virginia Scroggin is a 49 yo female presenting with a left great toe osteomyelitis treated with amputation 1 week ago.    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   I had a long discussion with Virginia Wery  regarding anatomy, pathology/pathophysiology, natural history, and prognosis of PAD/CLI.  Specifically, I reviewed with her the well-known data describing the "pathway to amputation", which tells [place] that a DM patient with an amputation today has a very high chance of further amputation in the next 1-2 years.    Regarding medical management, maximal medical therapy for reduction of risk  factors is indicated as recommended by updated AHA guidelines1.  This includes anti-platelet medication, tight blood glucose control to a HbA1c < 7, tight blood pressure control, maximum-dose HMG-CoA reductase inhibitor, and smoking cessation.     My discussion with her is that given her almost normal non-invasive exam, we can defer an angiogram at this time, in lieu of very close observation during her post-operative follow up.  If she were to have any plateau or signs of non-healing, my recommendation to her would be for formal angiogram and possible intervention.   She understands.   Plan: - Given her almost normal non-invasive study, I think close clinic follow up is indicated to assure that she heals her surgical wound. - If she has any plateau or stall of healing, she understands that our recommendation is for angiogram and possible intervention - Given her history and diagnosis, annual surveillance ABI is now recommended. We will schedule her for ABI in 1 year, in the case that she need no further intervention before then. -Recommend continuing maximal medical therapy for cardiovascular risk reduction, including anti-platelet therapy. -Annual flu vaccination is recommended in the setting of known PAD, in the absence of contra-indications.    ___________________________________________________________________   1Morley Kos MD, et al. 2016 AHA/ACC Guideline on the Management of Patients With Lower Extremity Peripheral Artery Disease: Executive Summary: A Report of the American College of  Cardiology/American Heart Association Task Force on Clinical Practice Guidelines. J Am Coll Cardiol. 2017 Mar 21;69(11):1465-1508. doi: 10.1016/j.jacc.2016.11.008.   2 - Norgren L, et al. TASC II Working Group. Inter-society consensus for the management of peripheral arterial disease. Int Tressia Miners. 2007 Jun;26(2):81-157. Review. PubMed PMID: 69678938  3 - Hingorani A, et al. The management of diabetic foot: A clinical practice guideline by the Society for Vascular Surgery in collaboration with the [place] and the Society  for Vascular Medicine. J Vasc Surg. 2016 Feb;63(2 Suppl):3S-21S. doi: 10.1016/j.jvs.2015.10.003. PubMed PMID: 10175102.  4 - Corinna Gab, Saab FA, Luberta Mutter, Grant Ruts, Ewell Poe, Driver VR, McKenzie, Lookstein R, van den Baldemar Lenis, Jaff MR, Guadalupe Dawn, Henao S, AlMahameed A, Katzen B. Digital Subtraction Angiography Prior to an Amputation for Critical Limb Ischemia (CLI): An Expert Recommendation Statement From the CLI Global Society to Optimize Limb Salvage. J Endovasc Ther. 2020 Aug;27(4):540-546. doi: 10.1177/1526602820928590. Epub 2020 May 29. PMID: 58527782.    Thank you for this interesting consult.  I greatly enjoyed meeting Anandi C Tawney and look forward to participating in their care.  A copy of this report was sent to the requesting provider on this date.  Electronically Signed: Corrie Mckusick 04/19/2019, 10:45 AM   I spent a total of  40 Minutes   in face to face in clinical consultation, greater than 50% of which was counseling/coordinating care for left great toe wound, possible angiogram and intervention

## 2019-04-20 ENCOUNTER — Encounter: Payer: Self-pay | Admitting: Podiatry

## 2019-04-20 ENCOUNTER — Ambulatory Visit (INDEPENDENT_AMBULATORY_CARE_PROVIDER_SITE_OTHER): Payer: 59

## 2019-04-20 ENCOUNTER — Ambulatory Visit (INDEPENDENT_AMBULATORY_CARE_PROVIDER_SITE_OTHER): Payer: 59 | Admitting: Podiatry

## 2019-04-20 ENCOUNTER — Other Ambulatory Visit: Payer: Self-pay

## 2019-04-20 DIAGNOSIS — M2062 Acquired deformities of toe(s), unspecified, left foot: Secondary | ICD-10-CM | POA: Diagnosis not present

## 2019-04-20 DIAGNOSIS — M79671 Pain in right foot: Secondary | ICD-10-CM

## 2019-04-20 DIAGNOSIS — S98112A Complete traumatic amputation of left great toe, initial encounter: Secondary | ICD-10-CM

## 2019-04-20 NOTE — Progress Notes (Addendum)
Subjective:  Patient ID: Virginia [name], female    DOB: Apr 25, 1970,  MRN: [id number]  Chief Complaint  Patient presents with  . Routine Post Op    POV#1 DOS 04/11/2019 AMPUTATION TOE MPJ JOINT HALLUX LT, pt shows no signs of infection, pt is well wrapped, pt also states that she is not taking any pain meds    49 y.o. female returns for post-op check.  Patient states she is doing well.  Her pain is well controlled on ibuprofen.  She does not like the Percocet medication.  She has been ambulating with a cam boot.  No clinical signs of infection noted.  Review of Systems: Negative except as noted in the HPI. Denies N/V/F/Ch.  Past Medical History:  Diagnosis Date  . Diabetes mellitus   . Hypertension    on HCTZ  . Obesity   . Wears glasses     Current Outpatient Medications:  .  Alcohol Swabs (ALCOHOL PADS) 70 % PADS, 1 each by Does not apply route as directed., Disp: 90 each, Rfl: 3 .  atorvastatin (LIPITOR) 40 MG tablet, Take 1 tablet (40 mg total) by mouth daily., Disp: 90 tablet, Rfl: 3 .  Blood Glucose Monitoring Suppl (ONETOUCH VERIO) w/Device KIT, 1 kit by Does not apply route daily., Disp: 1 kit, Rfl: 1 .  doxycycline (VIBRA-TABS) 100 MG tablet, Take 1 tablet (100 mg total) by mouth 2 (two) times daily., Disp: 20 tablet, Rfl: 0 .  empagliflozin (JARDIANCE) 25 MG TABS tablet, Take 25 mg by mouth daily., Disp: 90 tablet, Rfl: 3 .  gabapentin (NEURONTIN) 300 MG capsule, Take 2 capsules (600 mg total) by mouth 2 (two) times daily., Disp: 120 capsule, Rfl: 3 .  glucose blood (ONETOUCH VERIO) test strip, Use 3 times daily, Disp: 100 each, Rfl: 12 .  hydrochlorothiazide (HYDRODIURIL) 25 MG tablet, Take 1 tablet (25 mg total) by mouth daily., Disp: 90 tablet, Rfl: 3 .  Insulin Glargine (BASAGLAR KWIKPEN) 100 UNIT/ML SOPN, INJECT 40 UNITS SUBCUTANEOUSLY ONCE DAILY, Disp: 15 mL, Rfl: 0 .  Insulin Pen Needle 31G X 5 MM MISC, Use one needle per application, Disp: 793 each, Rfl: 6 .   irbesartan (AVAPRO) 75 MG tablet, Take 1 tablet (75 mg total) by mouth daily., Disp: 90 tablet, Rfl: 3 .  meloxicam (MOBIC) 15 MG tablet, Take 15 mg by mouth daily., Disp: , Rfl:  .  Multiple Vitamins-Minerals (MULTIVITAMIN WITH MINERALS) tablet, Take 1 tablet by mouth daily. Reported on 07/09/2015, Disp: , Rfl:  .  ONETOUCH DELICA LANCETS FINE MISC, 1 each by Does not apply route 2 (two) times daily., Disp: 100 each, Rfl: 5 .  oxyCODONE-acetaminophen (PERCOCET) 5-325 MG tablet, Take 1 tablet by mouth every 6 (six) hours as needed for severe pain., Disp: 30 tablet, Rfl: 0  Social History   Tobacco Use  Smoking Status Former Smoker  . Packs/day: 0.25  . Years: 7.00  . Pack years: 1.75  . Types: Cigarettes  . Start date: 03/18/1991  . Quit date: 05/03/1998  . Years since quitting: 20.9  Smokeless Tobacco Never Used  Tobacco Comment   Works at [employer] does NOT smoke.     Allergies  Allergen Reactions  . Liraglutide Nausea And Vomiting    Projectile Vomiting  . Shrimp [Shellfish Allergy] Shortness Of Breath  . Ace Inhibitors Cough  . Metformin And Related Diarrhea and Nausea And Vomiting  . Rosuvastatin Other (See Comments)    Myalgia - Legs reported - resolved  upon discontinuation.    Objective:  There were no vitals filed for this visit. There is no height or weight on file to calculate BMI. Constitutional Well developed. Well nourished.  Vascular Foot warm and well perfused. Capillary refill normal to all digits.   Neurologic Normal speech. Oriented to person, place, and time. Epicritic sensation to light touch grossly present bilaterally.  Dermatologic Skin healing well without signs of infection. Skin edges well coapted without signs of infection.  Orthopedic: Tenderness to palpation noted about the surgical site.   Radiographs: 3 views of skeletally mature adult foot: Amputation noted at the level of the metatarsophalangeal joint of the left foot.  No concern for  cortical irregularity noted. Assessment:   1. Acquired deformity of joint of big toe of left foot   2. Pain in right foot   3. Amputation of left great toe [facility])    Plan:  Patient was evaluated and treated and all questions answered.  S/p foot surgery left -Progressing as expected post-operatively. -XR: See above -WB Status: Weightbearing as tolerated in cam boot.  I will plan on transitioning to surgical shoe next visit if everything looks good -Sutures: Intact without any signs of dehiscence.  No clinical signs of infection noted. -Medications: None -Foot redressed. -I reviewed the ABIs PVRs with/Dr. Mayer Camel findings with the patient.  If the wound does not heal, we will consider doing a vascular intervention at that time.  But at the moment she has the best flow to the left lower extremity up to the level of the ankle.  No follow-ups on file.

## 2019-04-21 ENCOUNTER — Other Ambulatory Visit: Payer: Self-pay

## 2019-04-21 DIAGNOSIS — I10 Essential (primary) hypertension: Secondary | ICD-10-CM

## 2019-04-21 DIAGNOSIS — E1165 Type 2 diabetes mellitus with hyperglycemia: Secondary | ICD-10-CM

## 2019-04-22 ENCOUNTER — Encounter: Payer: Self-pay | Admitting: Podiatry

## 2019-04-23 MED ORDER — IRBESARTAN 75 MG PO TABS: 75.0000 mg | ORAL_TABLET | Freq: Every day | ORAL | 3 refills | Status: DC

## 2019-04-27 ENCOUNTER — Other Ambulatory Visit: Payer: Self-pay

## 2019-04-27 ENCOUNTER — Ambulatory Visit (INDEPENDENT_AMBULATORY_CARE_PROVIDER_SITE_OTHER): Payer: 59 | Admitting: Podiatry

## 2019-04-27 DIAGNOSIS — S98112A Complete traumatic amputation of left great toe, initial encounter: Secondary | ICD-10-CM

## 2019-04-27 DIAGNOSIS — E1351 Other specified diabetes mellitus with diabetic peripheral angiopathy without gangrene: Secondary | ICD-10-CM

## 2019-04-27 DIAGNOSIS — M79671 Pain in right foot: Secondary | ICD-10-CM | POA: Diagnosis not present

## 2019-04-28 ENCOUNTER — Other Ambulatory Visit: Payer: Self-pay | Admitting: Family Medicine

## 2019-04-28 DIAGNOSIS — E1165 Type 2 diabetes mellitus with hyperglycemia: Secondary | ICD-10-CM

## 2019-04-29 ENCOUNTER — Other Ambulatory Visit: Payer: Self-pay | Admitting: Family Medicine

## 2019-04-29 ENCOUNTER — Encounter: Payer: Self-pay | Admitting: Podiatry

## 2019-04-29 NOTE — Progress Notes (Signed)
Subjective:  Patient ID: Virginia [name], female    DOB: 1971-02-10,  MRN: [id number]  Chief Complaint  Patient presents with  . Routine Post Op    POV#2 DOS 04/11/2019 AMPUTATION TOE MPJ JOINT 1ST LT pt shows no signs of infection, pt is well wrapped,    49 y.o. female returns for post-op check.  Patient states she is doing well.  Her pain is well controlled on ibuprofen.  She does not like the Percocet medication.  She has been ambulating with a cam boot.  No clinical signs of infection noted.  Review of Systems: Negative except as noted in the HPI. Denies N/V/F/Ch.  Past Medical History:  Diagnosis Date  . Diabetes mellitus   . Hypertension    on HCTZ  . Obesity   . Wears glasses     Current Outpatient Medications:  .  Alcohol Swabs (ALCOHOL PADS) 70 % PADS, 1 each by Does not apply route as directed., Disp: 90 each, Rfl: 3 .  atorvastatin (LIPITOR) 40 MG tablet, Take 1 tablet (40 mg total) by mouth daily., Disp: 90 tablet, Rfl: 3 .  Blood Glucose Monitoring Suppl (ONETOUCH VERIO) w/Device KIT, 1 kit by Does not apply route daily., Disp: 1 kit, Rfl: 1 .  doxycycline (VIBRA-TABS) 100 MG tablet, Take 1 tablet (100 mg total) by mouth 2 (two) times daily., Disp: 20 tablet, Rfl: 0 .  empagliflozin (JARDIANCE) 25 MG TABS tablet, Take 25 mg by mouth daily., Disp: 90 tablet, Rfl: 3 .  gabapentin (NEURONTIN) 300 MG capsule, Take 2 capsules (600 mg total) by mouth 2 (two) times daily., Disp: 120 capsule, Rfl: 3 .  glucose blood (ONETOUCH VERIO) test strip, Use 3 times daily, Disp: 100 each, Rfl: 12 .  hydrochlorothiazide (HYDRODIURIL) 25 MG tablet, Take 1 tablet (25 mg total) by mouth daily., Disp: 90 tablet, Rfl: 3 .  Insulin Glargine (BASAGLAR KWIKPEN) 100 UNIT/ML SOPN, INJECT 40 UNITS SUBCUTANEOUSLY ONCE DAILY, Disp: 15 mL, Rfl: 0 .  Insulin Pen Needle 31G X 5 MM MISC, Use one needle per application, Disp: 546 each, Rfl: 6 .  irbesartan (AVAPRO) 75 MG tablet, Take 1 tablet (75 mg total) by  mouth daily., Disp: 90 tablet, Rfl: 3 .  meloxicam (MOBIC) 15 MG tablet, Take 15 mg by mouth daily., Disp: , Rfl:  .  Multiple Vitamins-Minerals (MULTIVITAMIN WITH MINERALS) tablet, Take 1 tablet by mouth daily. Reported on 07/09/2015, Disp: , Rfl:  .  ONETOUCH DELICA LANCETS FINE MISC, 1 each by Does not apply route 2 (two) times daily., Disp: 100 each, Rfl: 5 .  oxyCODONE-acetaminophen (PERCOCET) 5-325 MG tablet, Take 1 tablet by mouth every 6 (six) hours as needed for severe pain., Disp: 30 tablet, Rfl: 0  Social History   Tobacco Use  Smoking Status Former Smoker  . Packs/day: 0.25  . Years: 7.00  . Pack years: 1.75  . Types: Cigarettes  . Start date: 03/18/1991  . Quit date: 05/03/1998  . Years since quitting: 21.0  Smokeless Tobacco Never Used  Tobacco Comment   Works at [employer] does NOT smoke.     Allergies  Allergen Reactions  . Liraglutide Nausea And Vomiting    Projectile Vomiting  . Shrimp [Shellfish Allergy] Shortness Of Breath  . Ace Inhibitors Cough  . Metformin And Related Diarrhea and Nausea And Vomiting  . Rosuvastatin Other (See Comments)    Myalgia - Legs reported - resolved upon discontinuation.    Objective:  There were no vitals  filed for this visit. There is no height or weight on file to calculate BMI. Constitutional Well developed. Well nourished.  Vascular Foot warm and well perfused. Capillary refill normal to all digits.   Neurologic Normal speech. Oriented to person, place, and time. Epicritic sensation to light touch grossly present bilaterally.  Dermatologic Skin healing well without signs of infection. Skin edges well coapted without signs of infection.  Skin incision well reepithelialized.  Orthopedic:  No tenderness to palpation noted about the surgical site.   Radiographs: None Assessment:   No diagnosis found. Plan:  Patient was evaluated and treated and all questions answered.  S/p foot surgery left -Progressing as expected  post-operatively. -XR: See above -WB Status: Transition to surgical shoe from a cam boot. -Sutures: Sutures were removed no signs of dehiscence noted.  Incision is well coapted and reepithelialized. -Medications: None -Foot redressed. -I reviewed the ABIs PVRs with/Dr. Mayer Camel findings with the patient.  If the wound does not heal, we will consider doing a vascular intervention at that time.  But at the moment she has the best flow to the left lower extremity up to the level of the ankle.  Return in about 2 weeks (around 05/11/2019).

## 2019-05-11 ENCOUNTER — Ambulatory Visit (INDEPENDENT_AMBULATORY_CARE_PROVIDER_SITE_OTHER): Payer: 59 | Admitting: Podiatry

## 2019-05-11 ENCOUNTER — Other Ambulatory Visit: Payer: Self-pay

## 2019-05-11 ENCOUNTER — Ambulatory Visit (INDEPENDENT_AMBULATORY_CARE_PROVIDER_SITE_OTHER): Payer: 59

## 2019-05-11 DIAGNOSIS — S98112A Complete traumatic amputation of left great toe, initial encounter: Secondary | ICD-10-CM

## 2019-05-11 DIAGNOSIS — E1351 Other specified diabetes mellitus with diabetic peripheral angiopathy without gangrene: Secondary | ICD-10-CM

## 2019-05-11 DIAGNOSIS — M79671 Pain in right foot: Secondary | ICD-10-CM

## 2019-05-11 DIAGNOSIS — M2042 Other hammer toe(s) (acquired), left foot: Secondary | ICD-10-CM

## 2019-05-13 ENCOUNTER — Encounter: Payer: Self-pay | Admitting: Podiatry

## 2019-05-13 NOTE — Progress Notes (Signed)
Subjective:  Patient ID: Virginia [name], female    DOB: Jul 29, 1970,  MRN: [id number]  Chief Complaint  Patient presents with  . Routine Post Op    POV#3 DOS 04/11/2019 AMPUTATION TOE MPJ JOINT 1ST LT    49 y.o. female returns for post-op check.  Patient states that overall she is doing amazing.  Her left hallux side has completely healed.  She would like to know what is the next step in terms of her getting back to work and ambulating rides regularly.  She denies any other acute complaints.  Her pain is 1 out of 10.  She states that she does not have any diabetic insoles or any orthotics.  Review of Systems: Negative except as noted in the HPI. Denies N/V/F/Ch.  Past Medical History:  Diagnosis Date  . Diabetes mellitus   . Hypertension    on HCTZ  . Obesity   . Wears glasses     Current Outpatient Medications:  .  Alcohol Swabs (ALCOHOL PADS) 70 % PADS, 1 each by Does not apply route as directed., Disp: 90 each, Rfl: 3 .  atorvastatin (LIPITOR) 40 MG tablet, Take 1 tablet (40 mg total) by mouth daily., Disp: 90 tablet, Rfl: 3 .  Blood Glucose Monitoring Suppl (ONETOUCH VERIO) w/Device KIT, 1 kit by Does not apply route daily., Disp: 1 kit, Rfl: 1 .  doxycycline (VIBRA-TABS) 100 MG tablet, Take 1 tablet (100 mg total) by mouth 2 (two) times daily., Disp: 20 tablet, Rfl: 0 .  empagliflozin (JARDIANCE) 25 MG TABS tablet, Take 25 mg by mouth daily., Disp: 90 tablet, Rfl: 3 .  gabapentin (NEURONTIN) 300 MG capsule, Take 2 capsules (600 mg total) by mouth 2 (two) times daily., Disp: 120 capsule, Rfl: 3 .  glucose blood (ONETOUCH VERIO) test strip, Use 3 times daily, Disp: 100 each, Rfl: 12 .  hydrochlorothiazide (HYDRODIURIL) 25 MG tablet, Take 1 tablet (25 mg total) by mouth daily., Disp: 90 tablet, Rfl: 3 .  Insulin Glargine (BASAGLAR KWIKPEN) 100 UNIT/ML SOPN, INJECT 40 UNITS SUBCUTANEOUSLY ONCE DAILY, Disp: 15 mL, Rfl: 0 .  Insulin Pen Needle 31G X 5 MM MISC, Use one needle per  application, Disp: 962 each, Rfl: 6 .  irbesartan (AVAPRO) 75 MG tablet, Take 1 tablet (75 mg total) by mouth daily., Disp: 90 tablet, Rfl: 3 .  meloxicam (MOBIC) 15 MG tablet, Take 15 mg by mouth daily., Disp: , Rfl:  .  Multiple Vitamins-Minerals (MULTIVITAMIN WITH MINERALS) tablet, Take 1 tablet by mouth daily. Reported on 07/09/2015, Disp: , Rfl:  .  ONETOUCH DELICA LANCETS FINE MISC, 1 each by Does not apply route 2 (two) times daily., Disp: 100 each, Rfl: 5 .  oxyCODONE-acetaminophen (PERCOCET) 5-325 MG tablet, Take 1 tablet by mouth every 6 (six) hours as needed for severe pain., Disp: 30 tablet, Rfl: 0  Social History   Tobacco Use  Smoking Status Former Smoker  . Packs/day: 0.25  . Years: 7.00  . Pack years: 1.75  . Types: Cigarettes  . Start date: 03/18/1991  . Quit date: 05/03/1998  . Years since quitting: 21.0  Smokeless Tobacco Never Used  Tobacco Comment   Works at [employer] does NOT smoke.     Allergies  Allergen Reactions  . Liraglutide Nausea And Vomiting    Projectile Vomiting  . Shrimp [Shellfish Allergy] Shortness Of Breath  . Ace Inhibitors Cough  . Metformin And Related Diarrhea and Nausea And Vomiting  . Rosuvastatin Other (See Comments)  Myalgia - Legs reported - resolved upon discontinuation.    Objective:  There were no vitals filed for this visit. There is no height or weight on file to calculate BMI. Constitutional Well developed. Well nourished.  Vascular Foot warm and well perfused. Capillary refill normal to all digits.   Neurologic Normal speech. Oriented to person, place, and time. Epicritic sensation to light touch grossly present bilaterally.  Dermatologic  skin has completely reepithelialized without any dehiscence.  No clinical signs of infection noted.  No probe to bone needed.  No malodor or erythema noted.  Orthopedic:  No tenderness to palpation noted about the surgical site.   Radiographs: Right views of skeletally mature  adult foot noted: Mild arthritic changes noted in the dorsal midfoot.  All other bony abnormalities are within normal limits. Assessment:   1. Peripheral vascular disease due to secondary diabetes ([place])   2. Pain in right foot   3. Amputation of left great toe ([place])   4. Hammer toe of left foot    Plan:  Patient was evaluated and treated and all questions answered.  S/p foot surgery left -Progressing as expected post-operatively. -XR: See above -WB Status: Patient can continue her surgical shoe until she gets orthotics with failure and then can start transitioning into regular sneakers. -Sutures: Sutures were removed without any signs of dehiscence or complication noted.  No clinical signs of infection noted. 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  Patient will be scheduled to see Liliane Channel or [facility] for custom-made orthotics.  Return in 2 weeks (on 05/25/2019), or See Dr. Posey Pronto, for also see Liliane Channel for orthotics ASAP.

## 2019-05-17 ENCOUNTER — Other Ambulatory Visit: Payer: Self-pay | Admitting: Family Medicine

## 2019-05-17 DIAGNOSIS — E1165 Type 2 diabetes mellitus with hyperglycemia: Secondary | ICD-10-CM

## 2019-05-19 ENCOUNTER — Telehealth: Payer: Self-pay | Admitting: Podiatry

## 2019-05-19 NOTE — Telephone Encounter (Signed)
No, we just need to know what type:  sneaker (NB or Nike, etc) or dress shoe (if dress shoe, I would prefer to send a shoe with the orderO

## 2019-05-19 NOTE — Telephone Encounter (Signed)
Pt left message with questions about her appt tomorrow for casting for inserts. Does she need to bring shoes in with her?  Returned call and voicemail is full so not able to leave a message.

## 2019-05-20 ENCOUNTER — Other Ambulatory Visit: Payer: 59 | Admitting: Orthotics

## 2019-05-20 ENCOUNTER — Other Ambulatory Visit: Payer: Self-pay

## 2019-05-20 DIAGNOSIS — Z89412 Acquired absence of left great toe: Secondary | ICD-10-CM

## 2019-05-20 DIAGNOSIS — E1351 Other specified diabetes mellitus with diabetic peripheral angiopathy without gangrene: Secondary | ICD-10-CM

## 2019-05-25 ENCOUNTER — Other Ambulatory Visit: Payer: Self-pay

## 2019-05-25 ENCOUNTER — Ambulatory Visit (INDEPENDENT_AMBULATORY_CARE_PROVIDER_SITE_OTHER): Payer: 59 | Admitting: Podiatry

## 2019-05-25 ENCOUNTER — Ambulatory Visit (INDEPENDENT_AMBULATORY_CARE_PROVIDER_SITE_OTHER): Payer: 59

## 2019-05-25 DIAGNOSIS — B351 Tinea unguium: Secondary | ICD-10-CM | POA: Diagnosis not present

## 2019-05-25 DIAGNOSIS — E1351 Other specified diabetes mellitus with diabetic peripheral angiopathy without gangrene: Secondary | ICD-10-CM

## 2019-05-25 DIAGNOSIS — S98112A Complete traumatic amputation of left great toe, initial encounter: Secondary | ICD-10-CM

## 2019-05-25 LAB — HEPATIC FUNCTION PANEL
AG Ratio: 1.3 (calc) (ref 1.0–2.5)
ALT: 10 U/L (ref 6–29)
AST: 12 U/L (ref 10–35)
Albumin: 3.9 g/dL (ref 3.6–5.1)
Alkaline phosphatase (APISO): 163 U/L — ABNORMAL HIGH (ref 31–125)
Bilirubin, Direct: 0.1 mg/dL (ref 0.0–0.2)
Globulin: 3.1 g/dL (calc) (ref 1.9–3.7)
Indirect Bilirubin: 0.2 mg/dL (calc) (ref 0.2–1.2)
Total Bilirubin: 0.3 mg/dL (ref 0.2–1.2)
Total Protein: 7 g/dL (ref 6.1–8.1)

## 2019-05-26 ENCOUNTER — Encounter: Payer: Self-pay | Admitting: Podiatry

## 2019-05-26 MED ORDER — TERBINAFINE HCL 250 MG PO TABS: 250.0000 mg | ORAL_TABLET | Freq: Every day | ORAL | 0 refills | Status: DC

## 2019-05-26 NOTE — Progress Notes (Signed)
Subjective:  Patient ID: Virginia [name], female    DOB: Sep 06, 1970,  MRN: [id number]  Chief Complaint  Patient presents with  . Routine Post Op    POV#3 DOS 04/11/2019 AMPUTATION TOE MPJ JOINT 1ST LT, pt is doing a lot better since the last time she was here, pt also states that she is looking to talk about orthotics    49 y.o. female returns for post-op check.  Patient states she is doing really well.  She has completely healed the wound she does not have any acute complaints.  She has been wearing surgical shoe.  She is scheduled to get her diabetic shoes/rec for orthotics.  She has secondary complaints of nail fungus to the rest of the digits.  She states that she is tried everything over-the-counter and she wants to know if there is any other interventions that could be done to help decrease the fungal growth.  I discussed with her the management of fungal toenails.  Review of Systems: Negative except as noted in the HPI. Denies N/V/F/Ch.  Past Medical History:  Diagnosis Date  . Diabetes mellitus   . Hypertension    on HCTZ  . Obesity   . Wears glasses     Current Outpatient Medications:  .  Alcohol Swabs (ALCOHOL PADS) 70 % PADS, 1 each by Does not apply route as directed., Disp: 90 each, Rfl: 3 .  atorvastatin (LIPITOR) 40 MG tablet, Take 1 tablet (40 mg total) by mouth daily., Disp: 90 tablet, Rfl: 3 .  Blood Glucose Monitoring Suppl (ONETOUCH VERIO) w/Device KIT, 1 kit by Does not apply route daily., Disp: 1 kit, Rfl: 1 .  doxycycline (VIBRA-TABS) 100 MG tablet, Take 1 tablet (100 mg total) by mouth 2 (two) times daily., Disp: 20 tablet, Rfl: 0 .  empagliflozin (JARDIANCE) 25 MG TABS tablet, Take 25 mg by mouth daily., Disp: 90 tablet, Rfl: 3 .  gabapentin (NEURONTIN) 300 MG capsule, Take 2 capsules (600 mg total) by mouth 2 (two) times daily., Disp: 120 capsule, Rfl: 3 .  glucose blood (ONETOUCH VERIO) test strip, Use 3 times daily, Disp: 100 each, Rfl: 12 .  hydrochlorothiazide  (HYDRODIURIL) 25 MG tablet, Take 1 tablet (25 mg total) by mouth daily., Disp: 90 tablet, Rfl: 3 .  Insulin Glargine (BASAGLAR KWIKPEN) 100 UNIT/ML SOPN, INJECT 40 UNITS SUBCUTANEOUSLY ONCE DAILY, Disp: 15 mL, Rfl: 0 .  Insulin Pen Needle 31G X 5 MM MISC, Use one needle per application, Disp: 364 each, Rfl: 6 .  irbesartan (AVAPRO) 75 MG tablet, Take 1 tablet (75 mg total) by mouth daily., Disp: 90 tablet, Rfl: 3 .  meloxicam (MOBIC) 15 MG tablet, Take 15 mg by mouth daily., Disp: , Rfl:  .  Multiple Vitamins-Minerals (MULTIVITAMIN WITH MINERALS) tablet, Take 1 tablet by mouth daily. Reported on 07/09/2015, Disp: , Rfl:  .  ONETOUCH DELICA LANCETS FINE MISC, 1 each by Does not apply route 2 (two) times daily., Disp: 100 each, Rfl: 5 .  oxyCODONE-acetaminophen (PERCOCET) 5-325 MG tablet, Take 1 tablet by mouth every 6 (six) hours as needed for severe pain., Disp: 30 tablet, Rfl: 0 .  terbinafine (LAMISIL) 250 MG tablet, Take 1 tablet (250 mg total) by mouth daily., Disp: 90 tablet, Rfl: 0  Social History   Tobacco Use  Smoking Status Former Smoker  . Packs/day: 0.25  . Years: 7.00  . Pack years: 1.75  . Types: Cigarettes  . Start date: 03/18/1991  . Quit date: 05/03/1998  .  Years since quitting: 21.0  Smokeless Tobacco Never Used  Tobacco Comment   Works at [employer] does NOT smoke.     Allergies  Allergen Reactions  . Liraglutide Nausea And Vomiting    Projectile Vomiting  . Shrimp [Shellfish Allergy] Shortness Of Breath  . Ace Inhibitors Cough  . Metformin And Related Diarrhea and Nausea And Vomiting  . Rosuvastatin Other (See Comments)    Myalgia - Legs reported - resolved upon discontinuation.    Objective:  There were no vitals filed for this visit. There is no height or weight on file to calculate BMI. Constitutional Well developed. Well nourished.  Vascular Foot warm and well perfused. Capillary refill normal to all digits.   Neurologic Normal speech. Oriented to  person, place, and time. Epicritic sensation to light touch grossly present bilaterally.  Dermatologic  skin has completely reepithelialized without any dehiscence.  No clinical signs of infection noted.  No probe to bone needed.  No malodor or erythema noted.  Orthopedic:  No tenderness to palpation noted about the surgical site.   Radiographs: None s. Assessment:   1. Peripheral vascular disease due to secondary diabetes ([place])   2. Nail fungus   3. Amputation of left great toe ([place])   4. Onychomycosis due to dermatophyte    Plan:  Patient was evaluated and treated and all questions answered.  S/p foot surgery left amputation hallux -Completely reepithelialized.  No concern for dehiscence or infection.  At this time patient is discharged from my care.  I have told the patient that if there is any foot and ankle issues arise in the future come back and see me.  If the wound reopens or another wound DU develops make an appointment right away.  Patient states understanding and she will do so. -I would like her to transition directly from surgical shoe into her diabetic shoes.  Patient states understanding and will do so.  Onychomycosis toenails x9 Educated the patient on the etiology of onychomycosis and various treatment options associated with improving the fungal load.  I explained to the patient that there is 3 treatment options available to treat the onychomycosis including topical, p.o., laser treatment.  Patient elected to undergo p.o. options with Lamisil/terbinafine therapy.  In order for me to start the medication therapy, I explained to the patient the importance of evaluating the liver and obtaining the liver function test.  Once the liver function test comes back normal I will start him on 74-monthcourse of Lamisil therapy.  Patient understood all risk and would like to proceed with Lamisil therapy.  I have asked the patient to immediately stop the Lamisil therapy if she has any  reactions to it and call the office or go to the emergency room right away.  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  [facility]  No follow-ups on file.

## 2019-05-31 ENCOUNTER — Other Ambulatory Visit: Payer: Self-pay | Admitting: Family Medicine

## 2019-05-31 ENCOUNTER — Other Ambulatory Visit: Payer: Self-pay

## 2019-05-31 DIAGNOSIS — E1165 Type 2 diabetes mellitus with hyperglycemia: Secondary | ICD-10-CM

## 2019-05-31 MED ORDER — ONETOUCH VERIO VI STRP: ORAL_STRIP | 12 refills | Status: DC

## 2019-05-31 MED ORDER — ONETOUCH ULTRASOFT LANCETS MISC: 12 refills | Status: DC

## 2019-05-31 NOTE — Telephone Encounter (Signed)
Patient calls nurse line requesting lancets, test strips, and Basaglar.. Placed orders for provider and advised patient [name] has already been refilled.

## 2019-06-02 ENCOUNTER — Other Ambulatory Visit: Payer: Self-pay

## 2019-06-02 ENCOUNTER — Other Ambulatory Visit (INDEPENDENT_AMBULATORY_CARE_PROVIDER_SITE_OTHER): Payer: 59

## 2019-06-02 DIAGNOSIS — E1165 Type 2 diabetes mellitus with hyperglycemia: Secondary | ICD-10-CM

## 2019-06-02 LAB — POCT GLYCOSYLATED HEMOGLOBIN (HGB A1C): HbA1c, POC (controlled diabetic range): 9.1 % — AB (ref 0.0–7.0)

## 2019-06-02 NOTE — Progress Notes (Signed)
21 

## 2019-06-06 ENCOUNTER — Other Ambulatory Visit: Payer: Self-pay | Admitting: *Deleted

## 2019-06-07 MED ORDER — ONETOUCH ULTRASOFT LANCETS MISC: 12 refills | Status: DC

## 2019-06-08 ENCOUNTER — Telehealth: Payer: Self-pay | Admitting: Podiatry

## 2019-06-08 ENCOUNTER — Ambulatory Visit: Payer: 59 | Admitting: Orthotics

## 2019-06-08 ENCOUNTER — Other Ambulatory Visit: Payer: Self-pay

## 2019-06-08 ENCOUNTER — Encounter: Payer: Self-pay | Admitting: Family Medicine

## 2019-06-08 DIAGNOSIS — S98112A Complete traumatic amputation of left great toe, initial encounter: Secondary | ICD-10-CM

## 2019-06-08 DIAGNOSIS — B351 Tinea unguium: Secondary | ICD-10-CM

## 2019-06-08 DIAGNOSIS — E1351 Other specified diabetes mellitus with diabetic peripheral angiopathy without gangrene: Secondary | ICD-10-CM

## 2019-06-08 MED ORDER — ONETOUCH ULTRASOFT LANCETS MISC: 12 refills | Status: DC

## 2019-06-08 NOTE — Telephone Encounter (Signed)
Pt left message yesterday about her appt scheduled for today. The shoes she is wanting the orthotics for will not be in until Monday.Should she still come in and bring shoes with her to pick them up..  I returned call and left message that we could try the orthotics in a different shoe today we just want to make sure they fit her arch properly. Then once her shoes come in she can try them in those and if there is an issue she can schedule an appt to see Raiford Noble.

## 2019-06-08 NOTE — Progress Notes (Signed)
Patient came in today to pick up custom made foot orthotics.  The goals were accomplished and the patient reported no dissatisfaction with said orthotics.  Patient was advised of breakin period and how to report any issues. 

## 2019-06-08 NOTE — Addendum Note (Signed)
Addended by: Veronda Prude on: 06/08/2019 05:05 PM   Modules accepted: Orders

## 2019-06-24 ENCOUNTER — Ambulatory Visit (INDEPENDENT_AMBULATORY_CARE_PROVIDER_SITE_OTHER): Payer: 59

## 2019-06-24 ENCOUNTER — Encounter: Payer: Self-pay | Admitting: Podiatry

## 2019-06-24 ENCOUNTER — Other Ambulatory Visit: Payer: Self-pay

## 2019-06-24 ENCOUNTER — Ambulatory Visit: Payer: 59 | Admitting: Podiatry

## 2019-06-24 DIAGNOSIS — B351 Tinea unguium: Secondary | ICD-10-CM

## 2019-06-24 DIAGNOSIS — E1351 Other specified diabetes mellitus with diabetic peripheral angiopathy without gangrene: Secondary | ICD-10-CM | POA: Diagnosis not present

## 2019-06-24 DIAGNOSIS — S98112A Complete traumatic amputation of left great toe, initial encounter: Secondary | ICD-10-CM

## 2019-06-24 DIAGNOSIS — M7752 Other enthesopathy of left foot: Secondary | ICD-10-CM

## 2019-06-24 NOTE — Progress Notes (Signed)
Subjective:  Patient ID: Virginia [name], female    DOB: September 07, 1970,  MRN: [id number]  Chief Complaint  Patient presents with  . Foot Pain    pt is here for possible foot pain of the left foot, pt is concerned that her left foot has a fracture to it as well.    49 y.o. female returns for post-op check.  Patient states that she is doing well from the amputation site.  She has recently gone back to work where she is working 12 hours shift for a whole week at a time.  She states that there is lot of pain and swelling to the left second toe dorsal aspect.  She states been going on for quite some time.  She denies any other acute complaints.  She would like to know what is going on she just was to make sure that there is nothing deeper going on.  Review of Systems: Negative except as noted in the HPI. Denies N/V/F/Ch.  Past Medical History:  Diagnosis Date  . Diabetes mellitus   . Hypertension    on HCTZ  . Obesity   . Wears glasses     Current Outpatient Medications:  .  Alcohol Swabs (ALCOHOL PADS) 70 % PADS, 1 each by Does not apply route as directed., Disp: 90 each, Rfl: 3 .  atorvastatin (LIPITOR) 40 MG tablet, Take 1 tablet (40 mg total) by mouth daily., Disp: 90 tablet, Rfl: 3 .  Blood Glucose Monitoring Suppl (ONETOUCH VERIO) w/Device KIT, 1 kit by Does not apply route daily., Disp: 1 kit, Rfl: 1 .  doxycycline (VIBRA-TABS) 100 MG tablet, Take 1 tablet (100 mg total) by mouth 2 (two) times daily., Disp: 20 tablet, Rfl: 0 .  empagliflozin (JARDIANCE) 25 MG TABS tablet, Take 25 mg by mouth daily., Disp: 90 tablet, Rfl: 3 .  gabapentin (NEURONTIN) 300 MG capsule, Take 2 capsules (600 mg total) by mouth 2 (two) times daily., Disp: 120 capsule, Rfl: 3 .  glucose blood (ONETOUCH VERIO) test strip, Use 3 times daily, Disp: 100 each, Rfl: 12 .  hydrochlorothiazide (HYDRODIURIL) 25 MG tablet, Take 1 tablet (25 mg total) by mouth daily., Disp: 90 tablet, Rfl: 3 .  Insulin Glargine (BASAGLAR  KWIKPEN) 100 UNIT/ML, INJECT 40 UNITS SUBCUTANEOUSLY ONCE DAILY, Disp: 15 mL, Rfl: 0 .  Insulin Pen Needle 31G X 5 MM MISC, Use one needle per application, Disp: 867 each, Rfl: 6 .  irbesartan (AVAPRO) 75 MG tablet, Take 1 tablet (75 mg total) by mouth daily., Disp: 90 tablet, Rfl: 3 .  Lancets (ONETOUCH ULTRASOFT) lancets, Please use to check blood sugar 3 times daily before meals. E11.65, Disp: 100 each, Rfl: 12 .  meloxicam (MOBIC) 15 MG tablet, Take 15 mg by mouth daily., Disp: , Rfl:  .  Multiple Vitamins-Minerals (MULTIVITAMIN WITH MINERALS) tablet, Take 1 tablet by mouth daily. Reported on 07/09/2015, Disp: , Rfl:  .  ONETOUCH DELICA LANCETS FINE MISC, 1 each by Does not apply route 2 (two) times daily., Disp: 100 each, Rfl: 5 .  oxyCODONE-acetaminophen (PERCOCET) 5-325 MG tablet, Take 1 tablet by mouth every 6 (six) hours as needed for severe pain., Disp: 30 tablet, Rfl: 0 .  terbinafine (LAMISIL) 250 MG tablet, Take 1 tablet (250 mg total) by mouth daily., Disp: 90 tablet, Rfl: 0  Social History   Tobacco Use  Smoking Status Former Smoker  . Packs/day: 0.25  . Years: 7.00  . Pack years: 1.75  . Types: Cigarettes  .  Start date: 03/18/1991  . Quit date: 05/03/1998  . Years since quitting: 21.1  Smokeless Tobacco Never Used  Tobacco Comment   Works at [employer] does NOT smoke.     Allergies  Allergen Reactions  . Liraglutide Nausea And Vomiting    Projectile Vomiting  . Shrimp [Shellfish Allergy] Shortness Of Breath  . Ace Inhibitors Cough  . Metformin And Related Diarrhea and Nausea And Vomiting  . Rosuvastatin Other (See Comments)    Myalgia - Legs reported - resolved upon discontinuation.    Objective:  There were no vitals filed for this visit. There is no height or weight on file to calculate BMI. Constitutional Well developed. Well nourished.  Vascular Foot warm and well perfused. Capillary refill normal to all digits.   Neurologic Normal speech. Oriented to  person, place, and time. Epicritic sensation to light touch grossly present bilaterally.  Dermatologic  painful thickened elongated mycotic toenails x10.  Orthopedic:  Left second metatarsophalangeal joint pain with range of motion of the second MPJ active and passive.  Intra-articular pain noted.  Hammertoe contracture of the second digit clinically noted with dorsal dislocation of the second toe likely due to plantar plate rupture.   Radiographs: 3 views of skeletally mature adult foot: No stress fracture noted no signs of osteomyelitic changes noted.  Severe hammertoe contracture with dorsal subluxation of the second metatarsophalangeal joint. Assessment:   1. Peripheral vascular disease due to secondary diabetes ([place])   2. Capsulitis of toe of left foot   3. Nail fungus   4. Amputation of left great toe [facility])    Plan:  Patient was evaluated and treated and all questions answered.  Left second digit capsulitis -I explained to the patient the etiology of capsulitis likely in the setting of severe hammertoe contracture with subluxation dorsally putting a retrograde pressure inferiorly into the metatarsal head.  At this time given the amount of pain that she is having I believe she will benefit from steroid injection help decrease the acute inflammatory component of pain.  Patient agrees with the plan would like to proceed with the injection. -A steroid injection was performed at left second metatarsophalangeal joint using 1% plain Lidocaine and 10 mg of Kenalog. This was well tolerated.   S/p foot surgery left amputation hallux -Healed.  Onychomycosis toenails x9 Educated the patient on the etiology of onychomycosis and various treatment options associated with improving the fungal load.  I explained to the patient that there is 3 treatment options available to treat the onychomycosis including topical, p.o., laser treatment.  Patient elected to undergo p.o. options with Lamisil/terbinafine  therapy.  In order for me to start the medication therapy, I explained to the patient the importance of evaluating the liver and obtaining the liver function test.  Once the liver function test comes back normal I will start him on 65-monthcourse of Lamisil therapy.  Patient understood all risk and would like to proceed with Lamisil therapy.  I have asked the patient to immediately stop the Lamisil therapy if she has any reactions to it and call the office or go to the emergency room right away.  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  [facility]  No follow-ups on file.

## 2019-07-25 ENCOUNTER — Other Ambulatory Visit: Payer: Self-pay | Admitting: Family Medicine

## 2019-07-25 DIAGNOSIS — E1165 Type 2 diabetes mellitus with hyperglycemia: Secondary | ICD-10-CM

## 2019-08-10 ENCOUNTER — Encounter: Payer: Self-pay | Admitting: Podiatry

## 2019-08-10 ENCOUNTER — Ambulatory Visit (INDEPENDENT_AMBULATORY_CARE_PROVIDER_SITE_OTHER): Payer: 59

## 2019-08-10 ENCOUNTER — Other Ambulatory Visit: Payer: Self-pay

## 2019-08-10 ENCOUNTER — Ambulatory Visit: Payer: 59 | Admitting: Podiatry

## 2019-08-10 VITALS — Temp 97.2°F

## 2019-08-10 DIAGNOSIS — S90822A Blister (nonthermal), left foot, initial encounter: Secondary | ICD-10-CM | POA: Diagnosis not present

## 2019-08-10 DIAGNOSIS — M14672 Charcot's joint, left ankle and foot: Secondary | ICD-10-CM

## 2019-08-10 DIAGNOSIS — L089 Local infection of the skin and subcutaneous tissue, unspecified: Secondary | ICD-10-CM

## 2019-08-10 MED ORDER — DOXYCYCLINE HYCLATE 100 MG PO TABS
100.0000 mg | ORAL_TABLET | Freq: Two times a day (BID) | ORAL | 0 refills | Status: DC
Start: 2019-08-10 — End: 2019-10-25

## 2019-08-10 NOTE — Progress Notes (Signed)
Subjective:   Patient ID: Virginia [name], female   DOB: 49 y.o.   MRN: [id number]   HPI Patient states her foot is starting to bother her again and then she developed a blister that popped that she works third shift with steel toe shoes and it simply difficult for her to do her last A1c being 9.1 and she has had an amputation of the left hallux   ROS      Objective:  Physical Exam  Neurovascular status unchanged with patient having had blister of the left plantar lateral foot measuring about 6 x 6 cm that at currently is not drainage no erythema is noted currently associated with it and there is what appears to be moderate collapse of the arch     Assessment:  Probability for Charcot foot structure secondary to going back into steel toe shoes on cement floors with blister which appears to be localized with no indication of proximal spread     Plan:  H&P reviewed condition and I have recommended antibiotics precautionary measure soaks and I did dispense air fracture walker to try to stabilize and mobilize with consideration long-term for [facility] walker or possibly boot over her shoe.  I did discuss it can be very difficult for her to work with the problem she is having especially since she is now in an active Charcot foot on the left.  Patient is off of work and was written prescription for doxycycline twice daily  X-rays indicate there is destruction of the midfoot left Lisfranc's joint lateral side strongly indicating a Charcot structure

## 2019-08-16 ENCOUNTER — Other Ambulatory Visit: Payer: Self-pay | Admitting: Family Medicine

## 2019-08-24 ENCOUNTER — Encounter: Payer: Self-pay | Admitting: Podiatry

## 2019-08-24 ENCOUNTER — Other Ambulatory Visit: Payer: Self-pay

## 2019-08-24 ENCOUNTER — Ambulatory Visit (INDEPENDENT_AMBULATORY_CARE_PROVIDER_SITE_OTHER): Payer: 59 | Admitting: Podiatry

## 2019-08-24 DIAGNOSIS — L97422 Non-pressure chronic ulcer of left heel and midfoot with fat layer exposed: Secondary | ICD-10-CM

## 2019-08-24 DIAGNOSIS — S90822A Blister (nonthermal), left foot, initial encounter: Secondary | ICD-10-CM

## 2019-08-24 DIAGNOSIS — M14672 Charcot's joint, left ankle and foot: Secondary | ICD-10-CM

## 2019-08-25 ENCOUNTER — Telehealth: Payer: Self-pay | Admitting: Podiatry

## 2019-08-25 ENCOUNTER — Other Ambulatory Visit: Payer: Self-pay | Admitting: Family Medicine

## 2019-08-25 DIAGNOSIS — E1165 Type 2 diabetes mellitus with hyperglycemia: Secondary | ICD-10-CM

## 2019-08-25 NOTE — Telephone Encounter (Signed)
Pt called and cancel appt for tomorrow with [facility] for the crow walker. She stated her work is stating she cannot wear that at work. She has an appt in 2 wks with you and will discuss other options at that time.

## 2019-08-25 NOTE — Progress Notes (Signed)
Subjective:  Patient ID: Virginia [name], female    DOB: 15-Sep-1970,  MRN: [id number]  Chief Complaint  Patient presents with  . Routine Post Op    / DOS 04/11/2019 AMPUTATION TOE MPJ JOINT 1ST LT    49 y.o. female presents for wound care.  Patient presents with a new complaint of a new ulceration that started forming on the left plantar midfoot wound.  Patient states that it came out of nowhere and progressive and to getting worse.  Patient was seen by Dr. Paulla Dolly while I was away and was being managed with local wound care.  Patient also had a new onset of Charcot breakdown to the midfoot and appears to be still consolidating.  Patient is currently in a cam boot and I have asked her to not put any weight on the foot.  She states that she would do her best however she is still working and her work does require her to be nonweightbearing.  Pain scale 7 out of 10.  Her amputation site that had previously done has healed considerably without any acute issues.   Review of Systems: Negative except as noted in the HPI. Denies N/V/F/Ch.  Past Medical History:  Diagnosis Date  . Diabetes mellitus   . Hypertension    on HCTZ  . Obesity   . Wears glasses     Current Outpatient Medications:  .  Alcohol Swabs (ALCOHOL PADS) 70 % PADS, 1 each by Does not apply route as directed., Disp: 90 each, Rfl: 3 .  atorvastatin (LIPITOR) 40 MG tablet, Take 1 tablet (40 mg total) by mouth daily., Disp: 90 tablet, Rfl: 3 .  Blood Glucose Monitoring Suppl (ONETOUCH VERIO) w/Device KIT, 1 kit by Does not apply route daily., Disp: 1 kit, Rfl: 1 .  doxycycline (VIBRA-TABS) 100 MG tablet, Take 1 tablet (100 mg total) by mouth 2 (two) times daily., Disp: 20 tablet, Rfl: 0 .  doxycycline (VIBRA-TABS) 100 MG tablet, Take 1 tablet (100 mg total) by mouth 2 (two) times daily., Disp: 20 tablet, Rfl: 0 .  empagliflozin (JARDIANCE) 25 MG TABS tablet, Take 25 mg by mouth daily., Disp: 90 tablet, Rfl: 3 .  gabapentin (NEURONTIN)  300 MG capsule, Take 2 capsules (600 mg total) by mouth 2 (two) times daily., Disp: 360 capsule, Rfl: 0 .  glucose blood (ONETOUCH VERIO) test strip, Use 3 times daily, Disp: 100 each, Rfl: 12 .  hydrochlorothiazide (HYDRODIURIL) 25 MG tablet, Take 1 tablet (25 mg total) by mouth daily., Disp: 90 tablet, Rfl: 3 .  Insulin Glargine (BASAGLAR KWIKPEN) 100 UNIT/ML, INJECT 40 UNITS SUBCUTANEOUSLY ONCE DAILY, Disp: 15 mL, Rfl: 0 .  Insulin Pen Needle 31G X 5 MM MISC, Use one needle per application, Disp: 372 each, Rfl: 6 .  irbesartan (AVAPRO) 75 MG tablet, Take 1 tablet (75 mg total) by mouth daily., Disp: 90 tablet, Rfl: 3 .  Lancets (ONETOUCH ULTRASOFT) lancets, Please use to check blood sugar 3 times daily before meals. E11.65, Disp: 100 each, Rfl: 12 .  meloxicam (MOBIC) 15 MG tablet, Take 15 mg by mouth daily., Disp: , Rfl:  .  Multiple Vitamins-Minerals (MULTIVITAMIN WITH MINERALS) tablet, Take 1 tablet by mouth daily. Reported on 07/09/2015, Disp: , Rfl:  .  ONETOUCH DELICA LANCETS FINE MISC, 1 each by Does not apply route 2 (two) times daily., Disp: 100 each, Rfl: 5 .  terbinafine (LAMISIL) 250 MG tablet, Take 1 tablet (250 mg total) by mouth daily., Disp: 90 tablet, Rfl:  0 .  doxycycline (VIBRA-TABS) 100 MG tablet, Take 1 tablet (100 mg total) by mouth 2 (two) times daily., Disp: 28 tablet, Rfl: 0  Social History   Tobacco Use  Smoking Status Former Smoker  . Packs/day: 0.25  . Years: 7.00  . Pack years: 1.75  . Types: Cigarettes  . Start date: 03/18/1991  . Quit date: 05/03/1998  . Years since quitting: 21.3  Smokeless Tobacco Never Used  Tobacco Comment   Works at [employer] does NOT smoke.     Allergies  Allergen Reactions  . Liraglutide Nausea And Vomiting    Projectile Vomiting  . Shrimp [Shellfish Allergy] Shortness Of Breath  . Ace Inhibitors Cough  . Metformin And Related Diarrhea and Nausea And Vomiting  . Rosuvastatin Other (See Comments)    Myalgia - Legs  reported - resolved upon discontinuation.    Objective:  There were no vitals filed for this visit. There is no height or weight on file to calculate BMI. Constitutional Well developed. Well nourished.  Vascular Dorsalis pedis pulses palpable bilaterally. Posterior tibial pulses palpable bilaterally. Capillary refill normal to all digits.  No cyanosis or clubbing noted. Pedal hair growth normal.  Neurologic Normal speech. Oriented to person, place, and time. Protective sensation absent  Dermatologic Wound Location: Left plantar midfoot Charcot wound probes with fat layer exposed.  Does not probe down to bone.  Mild erythema and maceration noted.  No malodor present no purulent drainage was expressed Wound Base: Mixed Granular/Fibrotic Peri-wound: Calloused Exudate: Scant/small amount Serosanguinous exudate Wound Measurements: -See below  Orthopedic: No pain to palpation either foot.   Radiographs: Previous x-rays taken on 526 were reviewed by me: Patient has an acute Charcot breakdown of the Lisfranc.  It appears to be consolidating.  There is swelling/increasing soft tissue volume/density across the dorsal aspect of the foot.  Patient does have a negative cuboid height. Assessment:   1. Charcot's joint of left ankle   2. Infected blister of left foot, initial encounter    Plan:  Patient was evaluated and treated and all questions answered.  Ulcer left plantar midfoot ulcer with fat layer exposed with underlying Charcot changes entering consolidation phase -Debridement as below. -Dressed with Betadine wet-to-dry, DSD. -Continue off-loading with cam boot. -I discussed with the patient that given in the setting of acute Charcot breakdown it is important to maintain nonweightbearing status even with a cam boot on.  Clinically patient does not have any redness, erythema or any acute inflammatory component associated with Charcot.  I believe the patient is entering consolidation stage  of Charcot.  However at this time patient would definitely benefit from a [facility] walker.  I have asked her to see Liliane Channel to obtain [facility] walker.  However patient is working currently and she does not want to take any time off as she cannot afford to do so she will try to find out if she is able to wear her [employer] walker at work. -I will continue to local wound care with debridement to attempt to heal the ulceration however patient does have negative day of cuboid height and may need surgical intervention such as cuboid planing to take the pressure off and therefore heel ulceration.   Procedure: Excisional Debridement of Wound Tool: Sharp chisel blade/tissue nipper Rationale: Removal of non-viable soft tissue from the wound to promote healing.  Anesthesia: none Pre-Debridement Wound Measurements: 4 cm x 4 cm x 0.4 cm  Post-Debridement Wound Measurements: 4.2 cm x 4.1 cm x  0.4 cm  Type of Debridement: Sharp Excisional Tissue Removed: Non-viable soft tissue Blood loss: Minimal (<50cc) Depth of Debridement: subcutaneous tissue. Technique: Sharp excisional debridement to bleeding, viable wound base.  Wound Progress: This is my initial encounter I will continue to monitor progression of the ulceration. Site healing conversation 7 Dressing: Dry, sterile, compression dressing. Disposition: Patient tolerated procedure well. Patient to return in 1 week for follow-up.  No follow-ups on file.

## 2019-08-25 NOTE — Telephone Encounter (Signed)
Pt called and wanted Dr.Patel to know that her job said she could not wear the crow boot at work she just wanted you to be informed and she canceled her appt with rick

## 2019-08-25 NOTE — Telephone Encounter (Signed)
Pt was seen in office yesterday and was supposed to have an antibiotic sent in to her pharmacy, pt checked with pharmacy and they have not received it.  Please send prescription to [facility] on [address]

## 2019-08-26 ENCOUNTER — Telehealth: Payer: Self-pay

## 2019-08-26 ENCOUNTER — Ambulatory Visit: Payer: 59 | Admitting: Orthotics

## 2019-08-26 ENCOUNTER — Other Ambulatory Visit: Payer: Self-pay | Admitting: Family Medicine

## 2019-08-26 ENCOUNTER — Encounter: Payer: Self-pay | Admitting: Podiatry

## 2019-08-26 DIAGNOSIS — E1165 Type 2 diabetes mellitus with hyperglycemia: Secondary | ICD-10-CM

## 2019-08-26 MED ORDER — BASAGLAR KWIKPEN 100 UNIT/ML ~~LOC~~ SOPN: 40.0000 [IU] | PEN_INJECTOR | Freq: Every day | SUBCUTANEOUS | 0 refills | Status: DC

## 2019-08-26 MED ORDER — DOXYCYCLINE HYCLATE 100 MG PO TABS
100.0000 mg | ORAL_TABLET | Freq: Two times a day (BID) | ORAL | 0 refills | Status: DC
Start: 2019-08-26 — End: 2019-10-25

## 2019-08-26 NOTE — Telephone Encounter (Signed)
Insuline refilled. Thank you have a good day.

## 2019-08-26 NOTE — Telephone Encounter (Signed)
I sent doxy over to her pharmacy. Thanks

## 2019-08-26 NOTE — Telephone Encounter (Signed)
Patient calling to check status of insulin refill.   Veronda Prude, RN

## 2019-08-26 NOTE — Telephone Encounter (Signed)
Left message informing pt the medication had been called to the [id number].

## 2019-08-26 NOTE — Addendum Note (Signed)
Addended by: Nicholes Rough on: 08/26/2019 06:54 AM   Modules accepted: Orders

## 2019-09-07 ENCOUNTER — Ambulatory Visit: Payer: 59 | Admitting: Podiatry

## 2019-09-07 ENCOUNTER — Other Ambulatory Visit: Payer: Self-pay

## 2019-09-07 ENCOUNTER — Encounter: Payer: Self-pay | Admitting: Podiatry

## 2019-09-07 DIAGNOSIS — L97422 Non-pressure chronic ulcer of left heel and midfoot with fat layer exposed: Secondary | ICD-10-CM | POA: Diagnosis not present

## 2019-09-07 DIAGNOSIS — M14672 Charcot's joint, left ankle and foot: Secondary | ICD-10-CM

## 2019-09-07 NOTE — Progress Notes (Signed)
Subjective:  Patient ID: Virginia [name], female    DOB: June 19, 1970,  MRN: [id number]  Chief Complaint  Patient presents with  . Foot Pain    pt is here for a wound check of the right foot, pt states that she is looking to find an alteranative route to the crow walker.    49 y.o. female presents for wound care.  Patient presents with a follow-up of left plantar midfoot ulceration.  Patient states the ulceration is going down.  Patient also has a Charcot breakdown of the foot.  She has been nonweightbearing with a cam boot.  She is here today for wound evaluation as well as make sure the Charcot has not gone progressively worse.  She is not able to get a [place] walker as her work will not allow it.  She will take time off of work and be complete nonweightbearing with a cam boot.  If there is any issues I would discussed total contact casting.   Review of Systems: Negative except as noted in the HPI. Denies N/V/F/Ch.  Past Medical History:  Diagnosis Date  . Diabetes mellitus   . Hypertension    on HCTZ  . Obesity   . Wears glasses     Current Outpatient Medications:  .  Alcohol Swabs (ALCOHOL PADS) 70 % PADS, 1 each by Does not apply route as directed., Disp: 90 each, Rfl: 3 .  atorvastatin (LIPITOR) 40 MG tablet, Take 1 tablet (40 mg total) by mouth daily., Disp: 90 tablet, Rfl: 3 .  Blood Glucose Monitoring Suppl (ONETOUCH VERIO) w/Device KIT, 1 kit by Does not apply route daily., Disp: 1 kit, Rfl: 1 .  doxycycline (VIBRA-TABS) 100 MG tablet, Take 1 tablet (100 mg total) by mouth 2 (two) times daily., Disp: 20 tablet, Rfl: 0 .  doxycycline (VIBRA-TABS) 100 MG tablet, Take 1 tablet (100 mg total) by mouth 2 (two) times daily., Disp: 20 tablet, Rfl: 0 .  doxycycline (VIBRA-TABS) 100 MG tablet, Take 1 tablet (100 mg total) by mouth 2 (two) times daily., Disp: 28 tablet, Rfl: 0 .  empagliflozin (JARDIANCE) 25 MG TABS tablet, Take 25 mg by mouth daily., Disp: 90 tablet, Rfl: 3 .  gabapentin  (NEURONTIN) 300 MG capsule, Take 2 capsules (600 mg total) by mouth 2 (two) times daily., Disp: 360 capsule, Rfl: 0 .  glucose blood (ONETOUCH VERIO) test strip, Use 3 times daily, Disp: 100 each, Rfl: 12 .  hydrochlorothiazide (HYDRODIURIL) 25 MG tablet, Take 1 tablet (25 mg total) by mouth daily., Disp: 90 tablet, Rfl: 3 .  Insulin Glargine (BASAGLAR KWIKPEN) 100 UNIT/ML, Inject 0.4 mLs (40 Units total) into the skin daily., Disp: 15 mL, Rfl: 0 .  Insulin Pen Needle 31G X 5 MM MISC, Use one needle per application, Disp: 503 each, Rfl: 6 .  irbesartan (AVAPRO) 75 MG tablet, Take 1 tablet (75 mg total) by mouth daily., Disp: 90 tablet, Rfl: 3 .  Lancets (ONETOUCH ULTRASOFT) lancets, Please use to check blood sugar 3 times daily before meals. E11.65, Disp: 100 each, Rfl: 12 .  meloxicam (MOBIC) 15 MG tablet, Take 15 mg by mouth daily., Disp: , Rfl:  .  Multiple Vitamins-Minerals (MULTIVITAMIN WITH MINERALS) tablet, Take 1 tablet by mouth daily. Reported on 07/09/2015, Disp: , Rfl:  .  ONETOUCH DELICA LANCETS FINE MISC, 1 each by Does not apply route 2 (two) times daily., Disp: 100 each, Rfl: 5 .  terbinafine (LAMISIL) 250 MG tablet, Take 1 tablet (250 mg  total) by mouth daily., Disp: 90 tablet, Rfl: 0  Social History   Tobacco Use  Smoking Status Former Smoker  . Packs/day: 0.25  . Years: 7.00  . Pack years: 1.75  . Types: Cigarettes  . Start date: 03/18/1991  . Quit date: 05/03/1998  . Years since quitting: 21.3  Smokeless Tobacco Never Used  Tobacco Comment   Works at [employer] does NOT smoke.     Allergies  Allergen Reactions  . Liraglutide Nausea And Vomiting    Projectile Vomiting  . Shrimp [Shellfish Allergy] Shortness Of Breath  . Ace Inhibitors Cough  . Metformin And Related Diarrhea and Nausea And Vomiting  . Rosuvastatin Other (See Comments)    Myalgia - Legs reported - resolved upon discontinuation.    Objective:  There were no vitals filed for this visit. There is  no height or weight on file to calculate BMI. Constitutional Well developed. Well nourished.  Vascular Dorsalis pedis pulses palpable bilaterally. Posterior tibial pulses palpable bilaterally. Capillary refill normal to all digits.  No cyanosis or clubbing noted. Pedal hair growth normal.  Neurologic Normal speech. Oriented to person, place, and time. Protective sensation absent  Dermatologic Wound Location: Left plantar midfoot Charcot wound probes with fat layer exposed.  Does not probe down to bone.  Mild erythema and maceration noted.  No malodor present no purulent drainage was expressed Wound Base: Mixed Granular/Fibrotic Peri-wound: Calloused Exudate: Scant/small amount Serosanguinous exudate Wound Measurements: -See below  Orthopedic: No pain to palpation either foot.   Radiographs: Previous x-rays taken on 526 were reviewed by me: Patient has an acute Charcot breakdown of the Lisfranc.  It appears to be consolidating.  There is swelling/increasing soft tissue volume/density across the dorsal aspect of the foot.  Patient does have a negative cuboid height. Assessment:   1. Charcot's joint of left ankle   2. Left midfoot ulcer, with fat layer exposed ([place])    Plan:  Patient was evaluated and treated and all questions answered.  Ulcer left plantar midfoot ulcer with fat layer exposed with underlying Charcot changes consolidating -Debridement as below. -Dressed with Betadine wet-to-dry, DSD. -Continue off-loading with cam boot. -I discussed with the patient that given in the setting of acute Charcot breakdown it is important to maintain nonweightbearing status even with a cam boot on.  Clinically patient does not have any redness, erythema or any acute inflammatory component associated with Charcot.  I believe the bones are consolidating.  Patient is not able to obtain [facility] walker as of work will not allow it.  At this time patient has taken time off and she states that she will  take it easy and be nonweightbearing to that left lower extremity.  I have asked her of the importance of maintaining nonweightbearing status as the bones itself are still breaking down.  Patient states understanding. -If patient is unable to maintain nonweightbearing status we will plan on applying total contact cast. -I will continue to local wound care with debridement to attempt to heal the ulceration however patient does have negative day of cuboid height and may need surgical intervention such as cuboid planing to take the pressure off and therefore heel ulceration.   Procedure: Excisional Debridement of Wound Tool: Sharp chisel blade/tissue nipper Rationale: Removal of non-viable soft tissue from the wound to promote healing.  Anesthesia: none Pre-Debridement Wound Measurements: 3 cm x 3 cm x 0.3 cm Post-Debridement Wound Measurements: 3.2 cm x 3.2 cm x 0.3 cm Type of Debridement:  Sharp Excisional Tissue Removed: Non-viable soft tissue Blood loss: Minimal (<50cc) Depth of Debridement: subcutaneous tissue. Technique: Sharp excisional debridement to bleeding, viable wound base.  Wound Progress: The wound is decreasing in size based on measurements. Site healing conversation 7 Dressing: Dry, sterile, compression dressing. Disposition: Patient tolerated procedure well. Patient to return in 1 week for follow-up.  No follow-ups on file.

## 2019-09-08 ENCOUNTER — Encounter: Payer: Self-pay | Admitting: Podiatry

## 2019-09-26 ENCOUNTER — Other Ambulatory Visit: Payer: Self-pay | Admitting: Family Medicine

## 2019-09-26 DIAGNOSIS — E1165 Type 2 diabetes mellitus with hyperglycemia: Secondary | ICD-10-CM

## 2019-09-28 ENCOUNTER — Ambulatory Visit (INDEPENDENT_AMBULATORY_CARE_PROVIDER_SITE_OTHER): Payer: 59 | Admitting: Podiatry

## 2019-09-28 ENCOUNTER — Other Ambulatory Visit: Payer: Self-pay

## 2019-09-28 ENCOUNTER — Encounter: Payer: Self-pay | Admitting: Podiatry

## 2019-09-28 DIAGNOSIS — L97422 Non-pressure chronic ulcer of left heel and midfoot with fat layer exposed: Secondary | ICD-10-CM | POA: Diagnosis not present

## 2019-09-28 DIAGNOSIS — M79675 Pain in left toe(s): Secondary | ICD-10-CM | POA: Diagnosis not present

## 2019-09-28 DIAGNOSIS — B351 Tinea unguium: Secondary | ICD-10-CM | POA: Diagnosis not present

## 2019-09-28 DIAGNOSIS — M79674 Pain in right toe(s): Secondary | ICD-10-CM

## 2019-09-28 DIAGNOSIS — M14672 Charcot's joint, left ankle and foot: Secondary | ICD-10-CM

## 2019-09-28 NOTE — Telephone Encounter (Signed)
Patient needs to make an appointment for diabetes follow up.

## 2019-09-29 ENCOUNTER — Encounter: Payer: Self-pay | Admitting: Podiatry

## 2019-09-29 NOTE — Progress Notes (Signed)
Subjective:  Patient ID: Virginia [name], female    DOB: [birth year],  MRN: [id number]  Chief Complaint  Patient presents with  . Wound Check    Left plantar wound check. Pt states improvement "I think it is much better"  . Nail Problem    Nail trim 1-5 bilateral "My right 1st toenail is getting caught"    49 y.o. female presents for wound care.  Patient presents with a follow-up of left plantar midfoot ulceration secondary to Charcot breakdown.  Patient has been nonweightbearing with a cam boot.  Patient states she is doing really well.  Her foot is no longer red hot swollen.  She states that her ulceration has also gone down considerably.  She always has a history of partial hallux amp that had done in the past.  She states that she would also like to have her nails debrided down as is causing her a lot of pain.  She denies any other acute complaints.  Review of Systems: Negative except as noted in the HPI. Denies N/V/F/Ch.  Past Medical History:  Diagnosis Date  . Diabetes mellitus   . Hypertension    on HCTZ  . Obesity   . Wears glasses     Current Outpatient Medications:  .  Alcohol Swabs (ALCOHOL PADS) 70 % PADS, 1 each by Does not apply route as directed., Disp: 90 each, Rfl: 3 .  atorvastatin (LIPITOR) 40 MG tablet, Take 1 tablet (40 mg total) by mouth daily., Disp: 90 tablet, Rfl: 3 .  Blood Glucose Monitoring Suppl (ONETOUCH VERIO) w/Device KIT, 1 kit by Does not apply route daily., Disp: 1 kit, Rfl: 1 .  doxycycline (VIBRA-TABS) 100 MG tablet, Take 1 tablet (100 mg total) by mouth 2 (two) times daily., Disp: 20 tablet, Rfl: 0 .  doxycycline (VIBRA-TABS) 100 MG tablet, Take 1 tablet (100 mg total) by mouth 2 (two) times daily., Disp: 20 tablet, Rfl: 0 .  doxycycline (VIBRA-TABS) 100 MG tablet, Take 1 tablet (100 mg total) by mouth 2 (two) times daily., Disp: 28 tablet, Rfl: 0 .  empagliflozin (JARDIANCE) 25 MG TABS tablet, Take 25 mg by mouth daily., Disp: 90 tablet, Rfl: 3 .   gabapentin (NEURONTIN) 300 MG capsule, Take 2 capsules (600 mg total) by mouth 2 (two) times daily., Disp: 360 capsule, Rfl: 0 .  glucose blood (ONETOUCH VERIO) test strip, Use 3 times daily, Disp: 100 each, Rfl: 12 .  hydrochlorothiazide (HYDRODIURIL) 25 MG tablet, Take 1 tablet (25 mg total) by mouth daily., Disp: 90 tablet, Rfl: 3 .  Insulin Pen Needle 31G X 5 MM MISC, Use one needle per application, Disp: 944 each, Rfl: 6 .  irbesartan (AVAPRO) 75 MG tablet, Take 1 tablet (75 mg total) by mouth daily., Disp: 90 tablet, Rfl: 3 .  Lancets (ONETOUCH ULTRASOFT) lancets, Please use to check blood sugar 3 times daily before meals. E11.65, Disp: 100 each, Rfl: 12 .  meloxicam (MOBIC) 15 MG tablet, Take 15 mg by mouth daily., Disp: , Rfl:  .  Multiple Vitamins-Minerals (MULTIVITAMIN WITH MINERALS) tablet, Take 1 tablet by mouth daily. Reported on 07/09/2015, Disp: , Rfl:  .  ONETOUCH DELICA LANCETS FINE MISC, 1 each by Does not apply route 2 (two) times daily., Disp: 100 each, Rfl: 5 .  terbinafine (LAMISIL) 250 MG tablet, Take 1 tablet (250 mg total) by mouth daily., Disp: 90 tablet, Rfl: 0 .  Insulin Glargine (BASAGLAR KWIKPEN) 100 UNIT/ML, INJECT 40 UNITS SUBCUTANEOUSLY ONCE DAILY, Disp: 15  mL, Rfl: 0  Social History   Tobacco Use  Smoking Status Former Smoker  . Packs/day: 0.25  . Years: 7.00  . Pack years: 1.75  . Types: Cigarettes  . Start date: 03/18/1991  . Quit date: 05/03/1998  . Years since quitting: 21.4  Smokeless Tobacco Never Used  Tobacco Comment   Works at [employer] does NOT smoke.     Allergies  Allergen Reactions  . Liraglutide Nausea And Vomiting    Projectile Vomiting  . Shrimp [Shellfish Allergy] Shortness Of Breath  . Ace Inhibitors Cough  . Metformin And Related Diarrhea and Nausea And Vomiting  . Rosuvastatin Other (See Comments)    Myalgia - Legs reported - resolved upon discontinuation.    Objective:  There were no vitals filed for this visit. There is  no height or weight on file to calculate BMI. Constitutional Well developed. Well nourished.  Vascular Dorsalis pedis pulses palpable bilaterally. Posterior tibial pulses palpable bilaterally. Capillary refill normal to all digits.  No cyanosis or clubbing noted. Pedal hair growth normal.  Neurologic Normal speech. Oriented to person, place, and time. Protective sensation absent  Dermatologic Wound Location: Left plantar midfoot Charcot wound probes with fat layer exposed.  Does not probe down to bone.  Mild erythema and maceration noted.  No malodor present no purulent drainage was expressed Wound Base: Mixed Granular/Fibrotic Peri-wound: Calloused Exudate: Scant/small amount Serosanguinous exudate Wound Measurements: -See below  Thick elongated dystrophic toenails x9.  Orthopedic: No pain to palpation either foot.  History of partial hallux amp left side   Radiographs: Previous x-rays taken on 526 were reviewed by me: Patient has an acute Charcot breakdown of the Lisfranc.  It appears to be consolidating.  There is swelling/increasing soft tissue volume/density across the dorsal aspect of the foot.  Patient does have a negative cuboid height. Assessment:   1. Charcot's joint of left ankle   2. Left midfoot ulcer, with fat layer exposed ([place])   3. Pain due to onychomycosis of toenails of both feet    Plan:  Patient was evaluated and treated and all questions answered.  Ulcer left plantar midfoot ulcer with fat layer exposed with underlying Charcot changes consolidating~decreasing -Debridement as below. -Dressed with Betadine wet-to-dry, DSD. -Continue off-loading with cam boot. -I discussed with the patient that given in the setting of acute Charcot breakdown it is important to maintain nonweightbearing status even with a cam boot on.  Clinically patient does not have any redness, erythema or any acute inflammatory component associated with Charcot.  I believe the bones are  consolidating.  Patient is not able to obtain [facility] walker as of work will not allow it.  -Continue nonweightbearing with a cam boot.  The wound is considerably decreasing. -If patient is unable to maintain nonweightbearing status we will plan on applying total contact cast. -I will continue to local wound care with debridement to attempt to heal the ulceration however patient does have negative day of cuboid height and may need surgical intervention such as cuboid planing to take the pressure off and therefore heel ulceration.  Onychomycosis with pain  -Nails palliatively debrided as below. -Educated on self-care  Procedure: Nail Debridement Rationale: pain  Type of Debridement: manual, sharp debridement. Instrumentation: Nail nipper, rotary burr. Number of Nails: 9  Procedures and Treatment: Consent by patient was obtained for treatment procedures. The patient understood the discussion of treatment and procedures well. All questions were answered thoroughly reviewed. Debridement of mycotic and hypertrophic toenails,  1 through 5 bilateral and clearing of subungual debris. No ulceration, no infection noted.  Return Visit-Office Procedure: Patient instructed to return to the office for a follow up visit 3 months for continued evaluation and treatment.  Boneta Lucks, DPM    No follow-ups on file.    Procedure: Excisional Debridement of Wound~decreasing  tool: Sharp chisel blade/tissue nipper Rationale: Removal of non-viable soft tissue from the wound to promote healing.  Anesthesia: none Pre-Debridement Wound Measurements: 0.6 x 0.6 x 0.3 Post-Debridement Wound Measurements: 0.7 cm x 0.7 cm x 0.3 cm Type of Debridement: Sharp Excisional Tissue Removed: Non-viable soft tissue Blood loss: Minimal (<50cc) Depth of Debridement: subcutaneous tissue. Technique: Sharp excisional debridement to bleeding, viable wound base.  Wound Progress: The wound is decreasing in size based on  measurements. Site healing conversation 7 Dressing: Dry, sterile, compression dressing. Disposition: Patient tolerated procedure well. Patient to return in 1 week for follow-up.  No follow-ups on file.

## 2019-10-25 ENCOUNTER — Encounter: Payer: Self-pay | Admitting: Family Medicine

## 2019-10-25 ENCOUNTER — Telehealth: Payer: Self-pay | Admitting: Family Medicine

## 2019-10-25 ENCOUNTER — Ambulatory Visit: Payer: 59 | Admitting: Family Medicine

## 2019-10-25 ENCOUNTER — Other Ambulatory Visit: Payer: Self-pay

## 2019-10-25 VITALS — BP 136/82 | HR 88 | Wt 262.2 lb

## 2019-10-25 DIAGNOSIS — E1165 Type 2 diabetes mellitus with hyperglycemia: Secondary | ICD-10-CM | POA: Diagnosis not present

## 2019-10-25 DIAGNOSIS — I10 Essential (primary) hypertension: Secondary | ICD-10-CM

## 2019-10-25 DIAGNOSIS — Z0000 Encounter for general adult medical examination without abnormal findings: Secondary | ICD-10-CM | POA: Diagnosis not present

## 2019-10-25 LAB — POCT GLYCOSYLATED HEMOGLOBIN (HGB A1C): HbA1c, POC (controlled diabetic range): 9.3 % — AB (ref 0.0–7.0)

## 2019-10-25 NOTE — Patient Instructions (Addendum)
It was very nice to meet you today. Please enjoy the rest of your week. Today you were seen for diabetes and high blood pressure. Please continue your current medication regimens. Follow up in 3 months for diabetes follow-up or sooner if needed.   Please call the clinic at [phone] if your symptoms worsen or you have any concerns. It was our pleasure to serve you.   Diabetes Mellitus and Nutrition, Adult When you have diabetes (diabetes mellitus), it is very important to have healthy eating habits because your blood sugar (glucose) levels are greatly affected by what you eat and drink. Eating healthy foods in the appropriate amounts, at about the same times every day, can help you:  Control your blood glucose.  Lower your risk of heart disease.   Improve your blood pressure.  Reach or maintain a healthy weight. Every person with diabetes is different, and each person has different needs for a meal plan. Your health care provider may recommend that you work with a diet and nutrition specialist (dietitian) to make a meal plan that is best for you. Your meal plan may vary depending on factors such as:  The calories you need.  The medicines you take.  Your weight.  Your blood glucose, blood pressure, and cholesterol levels.  Your activity level.  Other health conditions you have, such as heart or kidney disease. How do carbohydrates affect me? Carbohydrates, also called carbs, affect your blood glucose level more than any other type of food. Eating carbs naturally raises the amount of glucose in your blood. Carb counting is a method for keeping track of how many carbs you eat. Counting carbs is important to keep your blood glucose at a healthy level, especially if you use insulin or take certain oral diabetes medicines. It is important to know how many carbs you can safely have in each meal. This is different for every person. Your dietitian can help you calculate how many carbs you  should have at each meal and for each snack. Foods that contain carbs include:  Bread, cereal, rice, pasta, and crackers.  Potatoes and corn.  Peas, beans, and lentils.  Milk and yogurt.  Fruit and juice.  Desserts, such as cakes, cookies, ice cream, and candy. How does alcohol affect me? Alcohol can cause a sudden decrease in blood glucose (hypoglycemia), especially if you use insulin or take certain oral diabetes medicines. Hypoglycemia can be a life-threatening condition. Symptoms of hypoglycemia (sleepiness, dizziness, and confusion) are similar to symptoms of having too much alcohol. If your health care provider says that alcohol is safe for you, follow these guidelines:  Limit alcohol intake to no more than 1 drink per day for nonpregnant women and 2 drinks per day for men. One drink equals 12 oz of beer, 5 oz of wine, or 1 oz of hard liquor.  Do not drink on an empty stomach.  Keep yourself hydrated with water, diet soda, or unsweetened iced tea.  Keep in mind that regular soda, juice, and other mixers may contain a lot of sugar and must be counted as carbs. What are tips for following this plan?  Reading food labels  Start by checking the serving size on the "Nutrition Facts" label of packaged foods and drinks. The amount of calories, carbs, fats, and other nutrients listed on the label is based on one serving of the item. Many items contain more than one serving per package.  Check the total grams (g) of carbs in one serving.  You can calculate the number of servings of carbs in one serving by dividing the total carbs by 15. For example, if a food has 30 g of total carbs, it would be equal to 2 servings of carbs.  Check the number of grams (g) of saturated and trans fats in one serving. Choose foods that have low or no amount of these fats.  Check the number of milligrams (mg) of salt (sodium) in one serving. Most people should limit total sodium intake to less than 2,300  mg per day.  Always check the nutrition information of foods labeled as "low-fat" or "nonfat". These foods may be higher in added sugar or refined carbs and should be avoided.  Talk to your dietitian to identify your daily goals for nutrients listed on the label. Shopping  Avoid buying canned, premade, or processed foods. These foods tend to be high in fat, sodium, and added sugar.  Shop around the outside edge of the grocery store. This includes fresh fruits and vegetables, bulk grains, fresh meats, and fresh dairy. Cooking  Use low-heat cooking methods, such as baking, instead of high-heat cooking methods like deep frying.  Cook using healthy oils, such as olive, canola, or sunflower oil.  Avoid cooking with butter, cream, or high-fat meats. Meal planning  Eat meals and snacks regularly, preferably at the same times every day. Avoid going long periods of time without eating.  Eat foods high in fiber, such as fresh fruits, vegetables, beans, and whole grains. Talk to your dietitian about how many servings of carbs you can eat at each meal.  Eat 4-6 ounces (oz) of lean protein each day, such as lean meat, chicken, fish, eggs, or tofu. One oz of lean protein is equal to: ? 1 oz of meat, chicken, or fish. ? 1 egg. ?  cup of tofu.  Eat some foods each day that contain healthy fats, such as avocado, nuts, seeds, and fish. Lifestyle  Check your blood glucose regularly.  Exercise regularly as told by your health care provider. This may include: ? 150 minutes of moderate-intensity or vigorous-intensity exercise each week. This could be brisk walking, biking, or water aerobics. ? Stretching and doing strength exercises, such as yoga or weightlifting, at least 2 times a week.  Take medicines as told by your health care provider.  Do not use any products that contain nicotine or tobacco, such as cigarettes and e-cigarettes. If you need help quitting, ask your health care  provider.  Work with a Veterinary surgeon or diabetes educator to identify strategies to manage stress and any emotional and social challenges. Questions to ask a health care provider  Do I need to meet with a diabetes educator?  Do I need to meet with a dietitian?  What number can I call if I have questions?  When are the best times to check my blood glucose? Where to find more information:  American Diabetes Association: diabetes.org  Academy of Nutrition and Dietetics: www.eatright.[employer] of Diabetes and Digestive and Kidney Diseases (NIH): CarFlippers.tn Summary  A healthy meal plan will help you control your blood glucose and maintain a healthy lifestyle.  Working with a diet and nutrition specialist (dietitian) can help you make a meal plan that is best for you.  Keep in mind that carbohydrates (carbs) and alcohol have immediate effects on your blood glucose levels. It is important to count carbs and to use alcohol carefully. This information is not intended to replace advice given to you  by your health care provider. Make sure you discuss any questions you have with your health care provider. Document Revised: 02/13/2017 Document Reviewed: 04/07/2016 Elsevier Patient Education  2020 [employer].

## 2019-10-25 NOTE — Telephone Encounter (Signed)
Pt brought in vaccination card to update record; A copy has been placed in the red team folder.

## 2019-10-25 NOTE — Progress Notes (Signed)
    SUBJECTIVE:   CHIEF COMPLAINT / HPI:   Diabetes Current Regimen: Jardiance 25 mg daily, Insulin 40 units daily (Tried victoza and metformin in the past with poor toleration of medication) CBGs: unsure does not have meter today (mid 100s) Last A1c: 9.1 on 06/02/19  Last Eye Exam: 2 months ago [facility]- needs to send records) Statin: Lipitor 40 mg daily ACE/ARB: Irbesartan 75 mg daily Concerns: Snacks a lot, and uses a lot of salad dressing on salads  Hypertension: - Medications: Irbesartan 75 mg daily, HCTZ 25 mg daily - Compliance: Yes. - Checking BP at home: No - Denies any SOB, CP, vision changes, LE edema, medication SEs, or symptoms of hypotension - Diet: Snacks on junk food a lot but has cut back on regular sodas. Drinks diet sodas. 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 Age appropriate. Cardiac: RRR, normal heart sounds, no murmurs Respiratory: CTAB, normal effort Extremities: LLE in walking boot, effective but uneven gait d/t this device.  ASSESSMENT/PLAN:   DM (diabetes mellitus), type 2, uncontrolled (HCC) HgbA1c today 9.3. Discussed dietary and lifestyle changes such as sugar free salad dressing, less snacking, and when able to tolerate post surgery activities like brisk walking. Will follow up in 3 months to assess additional medication changes.   HYPERTENSION, BENIGN SYSTEMIC At goal. Continue current medication regimen.   Healthcare maintenance Pap- 2018 @ [facility] (record release form signed by patient during encounter) Eye Exam- 2 months ago @ [facility] (record release form signed by patient during encounter) COVID- Moderna x2 PHQ9- score of 13; #9 (0). Unable to  address this visit but will follow up at next visit.   Lavonda Jumbo, DO [facility]

## 2019-10-26 ENCOUNTER — Encounter: Payer: Self-pay | Admitting: Podiatry

## 2019-10-26 ENCOUNTER — Ambulatory Visit (INDEPENDENT_AMBULATORY_CARE_PROVIDER_SITE_OTHER): Payer: 59

## 2019-10-26 ENCOUNTER — Ambulatory Visit: Payer: 59 | Admitting: Podiatry

## 2019-10-26 DIAGNOSIS — M14672 Charcot's joint, left ankle and foot: Secondary | ICD-10-CM

## 2019-10-26 DIAGNOSIS — Z0000 Encounter for general adult medical examination without abnormal findings: Secondary | ICD-10-CM | POA: Insufficient documentation

## 2019-10-26 LAB — LIPID PANEL
Chol/HDL Ratio: 2.7 ratio (ref 0.0–4.4)
Cholesterol, Total: 114 mg/dL (ref 100–199)
HDL: 42 mg/dL (ref >39)
LDL Chol Calc (NIH): 51 mg/dL (ref 0–99)
Triglycerides: 114 mg/dL (ref 0–149)
VLDL Cholesterol Cal: 21 mg/dL (ref 5–40)

## 2019-10-26 LAB — HCV INTERPRETATION

## 2019-10-26 LAB — HCV AB W REFLEX TO QUANT PCR: HCV Ab: 0.1 s/co ratio (ref 0.0–0.9)

## 2019-10-26 NOTE — Telephone Encounter (Signed)
Updated patient's Immunization record with Covid-19 vaccine date dropped off.  Glennie Hawk, CMA

## 2019-10-26 NOTE — Assessment & Plan Note (Addendum)
Pap- 2018 @ [facility] (record release form signed by patient during encounter) Eye Exam- 2 months ago @ [facility] (record release form signed by patient during encounter) COVID- Moderna x2 PHQ9- score of 13; #9 (0). Unable to address this visit but will follow up at next visit.

## 2019-10-26 NOTE — Assessment & Plan Note (Signed)
HgbA1c today 9.3. Discussed dietary and lifestyle changes such as sugar free salad dressing, less snacking, and when able to tolerate post surgery activities like brisk walking. Will follow up in 3 months to assess additional medication changes.

## 2019-10-26 NOTE — Assessment & Plan Note (Signed)
At goal. Continue current medication regimen.  

## 2019-10-28 ENCOUNTER — Encounter: Payer: Self-pay | Admitting: Podiatry

## 2019-10-28 ENCOUNTER — Other Ambulatory Visit: Payer: Self-pay

## 2019-10-28 ENCOUNTER — Ambulatory Visit: Payer: 59 | Admitting: Orthotics

## 2019-10-28 DIAGNOSIS — M14672 Charcot's joint, left ankle and foot: Secondary | ICD-10-CM

## 2019-10-28 NOTE — Progress Notes (Signed)
Going to modify current f/o to address charcot foot.

## 2019-10-28 NOTE — Progress Notes (Signed)
Subjective:  Patient ID: Virginia [name], female    DOB: 09-30-1970,  MRN: [id number]  Chief Complaint  Patient presents with  . Routine Post Op     follow up/ DOS 04/11/2019 AMPUTATION TOE MPJ JOINT 1ST LT    49 y.o. female presents for wound care.  Patient presents with a follow-up of left plantar midfoot ulceration.  This is due to Charcot breakdown.  Patient has been somewhat compliant with weightbearing as tolerated in cam boot with occasional nonweightbearing.  I stressed the importance of nonweightbearing.  Patient states wound has healed and they have been doing aggressive Betadine wet-to-dry dressing changes.  She denies any acute complaints.  Review of Systems: Negative except as noted in the HPI. Denies N/V/F/Ch.  Past Medical History:  Diagnosis Date  . Diabetes mellitus   . Hypertension    on HCTZ  . Obesity   . Wears glasses     Current Outpatient Medications:  .  Alcohol Swabs (ALCOHOL PADS) 70 % PADS, 1 each by Does not apply route as directed., Disp: 90 each, Rfl: 3 .  atorvastatin (LIPITOR) 40 MG tablet, Take 1 tablet (40 mg total) by mouth daily., Disp: 90 tablet, Rfl: 3 .  Blood Glucose Monitoring Suppl (ONETOUCH VERIO) w/Device KIT, 1 kit by Does not apply route daily., Disp: 1 kit, Rfl: 1 .  empagliflozin (JARDIANCE) 25 MG TABS tablet, Take 25 mg by mouth daily., Disp: 90 tablet, Rfl: 3 .  gabapentin (NEURONTIN) 300 MG capsule, Take 2 capsules (600 mg total) by mouth 2 (two) times daily., Disp: 360 capsule, Rfl: 0 .  glucose blood (ONETOUCH VERIO) test strip, Use 3 times daily, Disp: 100 each, Rfl: 12 .  hydrochlorothiazide (HYDRODIURIL) 25 MG tablet, Take 1 tablet (25 mg total) by mouth daily., Disp: 90 tablet, Rfl: 3 .  Insulin Glargine (BASAGLAR KWIKPEN) 100 UNIT/ML, INJECT 40 UNITS SUBCUTANEOUSLY ONCE DAILY, Disp: 15 mL, Rfl: 0 .  Insulin Pen Needle 31G X 5 MM MISC, Use one needle per application, Disp: 509 each, Rfl: 6 .  irbesartan (AVAPRO) 75 MG tablet, Take  1 tablet (75 mg total) by mouth daily., Disp: 90 tablet, Rfl: 3 .  Lancets (ONETOUCH ULTRASOFT) lancets, Please use to check blood sugar 3 times daily before meals. E11.65, Disp: 100 each, Rfl: 12 .  Multiple Vitamins-Minerals (MULTIVITAMIN WITH MINERALS) tablet, Take 1 tablet by mouth daily. Reported on 07/09/2015, Disp: , Rfl:  .  ONETOUCH DELICA LANCETS FINE MISC, 1 each by Does not apply route 2 (two) times daily., Disp: 100 each, Rfl: 5 .  terbinafine (LAMISIL) 250 MG tablet, Take 1 tablet (250 mg total) by mouth daily., Disp: 90 tablet, Rfl: 0  Social History   Tobacco Use  Smoking Status Former Smoker  . Packs/day: 0.25  . Years: 7.00  . Pack years: 1.75  . Types: Cigarettes  . Start date: 03/18/1991  . Quit date: 05/03/1998  . Years since quitting: 21.5  Smokeless Tobacco Never Used  Tobacco Comment   Works at [employer] does NOT smoke.     Allergies  Allergen Reactions  . Liraglutide Nausea And Vomiting    Projectile Vomiting  . Shrimp [Shellfish Allergy] Shortness Of Breath  . Ace Inhibitors Cough  . Metformin And Related Diarrhea and Nausea And Vomiting  . Rosuvastatin Other (See Comments)    Myalgia - Legs reported - resolved upon discontinuation.    Objective:  There were no vitals filed for this visit. There is no height  or weight on file to calculate BMI. Constitutional Well developed. Well nourished.  Vascular Dorsalis pedis pulses palpable bilaterally. Posterior tibial pulses palpable bilaterally. Capillary refill normal to all digits.  No cyanosis or clubbing noted. Pedal hair growth normal.  Neurologic Normal speech. Oriented to person, place, and time. Protective sensation absent  Dermatologic Wound Location: Left plantar midfoot Charcot wound has completely reepithelialized.  No further wound noted upon debridement.  No clinical signs of infection noted.  Orthopedic: No pain to palpation either foot.  History of partial hallux amp left side    Radiographs: Previous x-rays taken on 5/26 were reviewed by me: Patient has an acute Charcot breakdown of the Lisfranc.  It appears to be consolidating.  There is swelling/increasing soft tissue volume/density across the dorsal aspect of the foot.  Patient does have a negative cuboid height. Assessment:   1. Charcot's joint of left ankle    Plan:  Patient was evaluated and treated and all questions answered.  Ulcer left plantar midfoot ulcer healed  -Debridement as below. -Dressed with Betadine wet-to-dry, DSD. -Continue off-loading with cam boot. -I discussed with the patient that given in the setting of acute Charcot breakdown it is important to maintain nonweightbearing status even with a cam boot on.  Clinically patient does not have any redness, erythema or any acute inflammatory component associated with Charcot.  I believe the bones are consolidating.  Patient is not able to obtain [facility] walker as of work will not allow it.  -Continue nonweightbearing with a cam boot.  The wound is considerably decreasing. -I will plan on obtaining an x-ray during next clinical visit in 1 month.  If there is no changes in the Charcot at this point patient may have consolidated the Charcot changes.  The wound has completely regressed and healed.  At this point I will keep the patient still in cam boot nonweightbearing however I will have her follow-up with [facility] for custom-made orthotics to offload the midfoot joint.  I will discuss with Liliane Channel to see if there is any type of bracing that we can obtain to allow for offloading of the midfoot joint.  Patient states understanding  Boneta Lucks, DPM    No follow-ups on file.     No follow-ups on file.

## 2019-10-31 ENCOUNTER — Other Ambulatory Visit: Payer: 59 | Admitting: Orthotics

## 2019-10-31 ENCOUNTER — Other Ambulatory Visit: Payer: Self-pay

## 2019-10-31 DIAGNOSIS — E1165 Type 2 diabetes mellitus with hyperglycemia: Secondary | ICD-10-CM

## 2019-10-31 MED ORDER — BASAGLAR KWIKPEN 100 UNIT/ML ~~LOC~~ SOPN: PEN_INJECTOR | SUBCUTANEOUS | 0 refills | Status: DC

## 2019-11-23 ENCOUNTER — Encounter: Payer: Self-pay | Admitting: Podiatry

## 2019-11-23 ENCOUNTER — Ambulatory Visit (INDEPENDENT_AMBULATORY_CARE_PROVIDER_SITE_OTHER): Payer: 59

## 2019-11-23 ENCOUNTER — Other Ambulatory Visit: Payer: Self-pay

## 2019-11-23 ENCOUNTER — Ambulatory Visit: Payer: 59 | Admitting: Podiatry

## 2019-11-23 DIAGNOSIS — M14672 Charcot's joint, left ankle and foot: Secondary | ICD-10-CM

## 2019-11-24 ENCOUNTER — Encounter: Payer: Self-pay | Admitting: Podiatry

## 2019-11-24 NOTE — Progress Notes (Signed)
Subjective:  Patient ID: Virginia [name], female    DOB: 05/07/1970,  MRN: [id number]  Chief Complaint  Patient presents with  . Routine Post Op    DOS 01.25.2021 Pt states satisfaction with custom shoe modification but is still experiencing plantar midfoot callousing.    49 y.o. female presents for wound care.  Patient presents with a follow-up of left plantar midfoot ulceration secondary to Charcot changes.  Patient states she is doing well.  The wound has completely reepithelialization stage be epithelialized.  She has got her bracing/orthotic which has been offloading really well.  She denies any other acute complaints..  Review of Systems: Negative except as noted in the HPI. Denies N/V/F/Ch.  Past Medical History:  Diagnosis Date  . Diabetes mellitus   . Hypertension    on HCTZ  . Obesity   . Wears glasses     Current Outpatient Medications:  .  Alcohol Swabs (ALCOHOL PADS) 70 % PADS, 1 each by Does not apply route as directed., Disp: 90 each, Rfl: 3 .  atorvastatin (LIPITOR) 40 MG tablet, Take 1 tablet (40 mg total) by mouth daily., Disp: 90 tablet, Rfl: 3 .  Blood Glucose Monitoring Suppl (ONETOUCH VERIO) w/Device KIT, 1 kit by Does not apply route daily., Disp: 1 kit, Rfl: 1 .  empagliflozin (JARDIANCE) 25 MG TABS tablet, Take 25 mg by mouth daily., Disp: 90 tablet, Rfl: 3 .  glucose blood (ONETOUCH VERIO) test strip, Use 3 times daily, Disp: 100 each, Rfl: 12 .  hydrochlorothiazide (HYDRODIURIL) 25 MG tablet, Take 1 tablet (25 mg total) by mouth daily., Disp: 90 tablet, Rfl: 3 .  Insulin Glargine (BASAGLAR KWIKPEN) 100 UNIT/ML, INJECT 40 UNITS SUBCUTANEOUSLY ONCE DAILY, Disp: 15 mL, Rfl: 0 .  Insulin Pen Needle 31G X 5 MM MISC, Use one needle per application, Disp: 973 each, Rfl: 6 .  irbesartan (AVAPRO) 75 MG tablet, Take 1 tablet (75 mg total) by mouth daily., Disp: 90 tablet, Rfl: 3 .  Lancets (ONETOUCH ULTRASOFT) lancets, Please use to check blood sugar 3 times daily before  meals. E11.65, Disp: 100 each, Rfl: 12 .  Multiple Vitamins-Minerals (MULTIVITAMIN WITH MINERALS) tablet, Take 1 tablet by mouth daily. Reported on 07/09/2015, Disp: , Rfl:  .  ONETOUCH DELICA LANCETS FINE MISC, 1 each by Does not apply route 2 (two) times daily., Disp: 100 each, Rfl: 5 .  terbinafine (LAMISIL) 250 MG tablet, Take 1 tablet (250 mg total) by mouth daily., Disp: 90 tablet, Rfl: 0 .  gabapentin (NEURONTIN) 300 MG capsule, Take 2 capsules (600 mg total) by mouth 2 (two) times daily., Disp: 360 capsule, Rfl: 0  Social History   Tobacco Use  Smoking Status Former Smoker  . Packs/day: 0.25  . Years: 7.00  . Pack years: 1.75  . Types: Cigarettes  . Start date: 03/18/1991  . Quit date: 05/03/1998  . Years since quitting: 21.5  Smokeless Tobacco Never Used  Tobacco Comment   Works at [employer] does NOT smoke.     Allergies  Allergen Reactions  . Liraglutide Nausea And Vomiting    Projectile Vomiting  . Shrimp [Shellfish Allergy] Shortness Of Breath  . Ace Inhibitors Cough  . Metformin And Related Diarrhea and Nausea And Vomiting  . Rosuvastatin Other (See Comments)    Myalgia - Legs reported - resolved upon discontinuation.    Objective:  There were no vitals filed for this visit. There is no height or weight on file to calculate BMI. Constitutional  Well developed. Well nourished.  Vascular Dorsalis pedis pulses palpable bilaterally. Posterior tibial pulses palpable bilaterally. Capillary refill normal to all digits.  No cyanosis or clubbing noted. Pedal hair growth normal.  Neurologic Normal speech. Oriented to person, place, and time. Protective sensation absent  Dermatologic Wound Location: Left plantar midfoot Charcot wound has completely reepithelialized.  No further wound noted upon debridement.  No clinical signs of infection noted.  Orthopedic: No pain to palpation either foot.  History of partial hallux amp left side   Radiographs: Previous x-rays  taken on 5/26 were reviewed by me: Patient has an acute Charcot breakdown of the Lisfranc.  It appears to be consolidating.  There is swelling/increasing soft tissue volume/density across the dorsal aspect of the foot.  Patient does have a negative cuboid height. Assessment:   1. Charcot's joint of left foot    Plan:  Patient was evaluated and treated and all questions answered.  Ulcer left plantar midfoot ulcer clinically healed and the underlying Charcot deformity has consolidated. -X-rays were reviewed in extensive detail with the patient.  Given that there is no changes between the previous x-ray till today it appears that the Charcot may have consolidated.  The wound has completely regressed and healed.  -Patient has received her orthotics with offloading to the lateral aspect of the Charcot deformity.  Patient will start utilizing the orthotics and can transition away from cam boot into regular sneakers with orthotics.  If there is a recurrence of the wound I have asked her to come back and see me right away.  Patient can start returning to work and 2 weeks -If patient has any clinical signs of infection recurrence of the wound she will come and see me immediately or go to the nearest emergency room.  Patient states understanding  Boneta Lucks, DPM    No follow-ups on file.     No follow-ups on file.

## 2019-12-05 ENCOUNTER — Other Ambulatory Visit: Payer: Self-pay | Admitting: Family Medicine

## 2019-12-05 ENCOUNTER — Other Ambulatory Visit: Payer: Self-pay | Admitting: Podiatry

## 2019-12-05 DIAGNOSIS — E1165 Type 2 diabetes mellitus with hyperglycemia: Secondary | ICD-10-CM

## 2019-12-05 NOTE — Telephone Encounter (Signed)
Please Advise

## 2019-12-06 NOTE — Telephone Encounter (Signed)
No need to see a new liver function test before I can renew this medication for her.

## 2019-12-24 ENCOUNTER — Other Ambulatory Visit: Payer: Self-pay | Admitting: Family Medicine

## 2019-12-24 ENCOUNTER — Other Ambulatory Visit: Payer: Self-pay | Admitting: Podiatry

## 2019-12-24 DIAGNOSIS — E1165 Type 2 diabetes mellitus with hyperglycemia: Secondary | ICD-10-CM

## 2019-12-26 NOTE — Telephone Encounter (Signed)
Please Advise

## 2020-01-17 ENCOUNTER — Other Ambulatory Visit: Payer: Self-pay | Admitting: Family Medicine

## 2020-01-17 DIAGNOSIS — E782 Mixed hyperlipidemia: Secondary | ICD-10-CM

## 2020-01-28 ENCOUNTER — Other Ambulatory Visit: Payer: Self-pay | Admitting: Family Medicine

## 2020-01-28 DIAGNOSIS — E1165 Type 2 diabetes mellitus with hyperglycemia: Secondary | ICD-10-CM

## 2020-03-13 ENCOUNTER — Other Ambulatory Visit: Payer: Self-pay | Admitting: Family Medicine

## 2020-03-13 DIAGNOSIS — E1165 Type 2 diabetes mellitus with hyperglycemia: Secondary | ICD-10-CM

## 2020-03-13 DIAGNOSIS — E782 Mixed hyperlipidemia: Secondary | ICD-10-CM

## 2020-03-23 ENCOUNTER — Other Ambulatory Visit: Payer: Self-pay | Admitting: Family Medicine

## 2020-03-23 DIAGNOSIS — Z1231 Encounter for screening mammogram for malignant neoplasm of breast: Secondary | ICD-10-CM

## 2020-04-14 ENCOUNTER — Other Ambulatory Visit: Payer: Self-pay | Admitting: Family Medicine

## 2020-04-14 DIAGNOSIS — E782 Mixed hyperlipidemia: Secondary | ICD-10-CM

## 2020-04-14 DIAGNOSIS — E1165 Type 2 diabetes mellitus with hyperglycemia: Secondary | ICD-10-CM

## 2020-04-14 DIAGNOSIS — I10 Essential (primary) hypertension: Secondary | ICD-10-CM

## 2020-04-23 ENCOUNTER — Other Ambulatory Visit: Payer: Self-pay

## 2020-04-23 DIAGNOSIS — E11649 Type 2 diabetes mellitus with hypoglycemia without coma: Secondary | ICD-10-CM

## 2020-04-23 MED ORDER — EMPAGLIFLOZIN 25 MG PO TABS: 25.0000 mg | ORAL_TABLET | Freq: Every day | ORAL | 3 refills | Status: DC

## 2020-04-23 NOTE — Telephone Encounter (Signed)
Patient calls nurse line for rx refill on jardiance. Informed patient that she is due for an A1C/ diabetes follow up. Patient scheduled with Red Team provider on 04/30/20  Veronda Prude, RN

## 2020-04-30 ENCOUNTER — Encounter: Payer: Self-pay | Admitting: Family Medicine

## 2020-04-30 ENCOUNTER — Ambulatory Visit: Payer: 59 | Admitting: Family Medicine

## 2020-04-30 ENCOUNTER — Other Ambulatory Visit: Payer: Self-pay

## 2020-04-30 VITALS — BP 122/70 | HR 84 | Ht 66.0 in | Wt 255.0 lb

## 2020-04-30 DIAGNOSIS — E1165 Type 2 diabetes mellitus with hyperglycemia: Secondary | ICD-10-CM

## 2020-04-30 DIAGNOSIS — E782 Mixed hyperlipidemia: Secondary | ICD-10-CM | POA: Diagnosis not present

## 2020-04-30 DIAGNOSIS — I10 Essential (primary) hypertension: Secondary | ICD-10-CM | POA: Diagnosis not present

## 2020-04-30 DIAGNOSIS — E11649 Type 2 diabetes mellitus with hypoglycemia without coma: Secondary | ICD-10-CM | POA: Diagnosis not present

## 2020-04-30 LAB — POCT GLYCOSYLATED HEMOGLOBIN (HGB A1C): Hemoglobin A1C: 10 % — AB (ref 4.0–5.6)

## 2020-04-30 MED ORDER — LIRAGLUTIDE 18 MG/3ML ~~LOC~~ SOPN: 0.6000 mg | PEN_INJECTOR | Freq: Every day | SUBCUTANEOUS | 3 refills | Status: DC

## 2020-04-30 MED ORDER — EMPAGLIFLOZIN 25 MG PO TABS: 25.0000 mg | ORAL_TABLET | Freq: Every day | ORAL | 3 refills | Status: DC

## 2020-04-30 MED ORDER — HYDROCHLOROTHIAZIDE 25 MG PO TABS: 25.0000 mg | ORAL_TABLET | Freq: Every day | ORAL | 3 refills | Status: DC

## 2020-04-30 MED ORDER — BASAGLAR KWIKPEN 100 UNIT/ML ~~LOC~~ SOPN: 50.0000 [IU] | PEN_INJECTOR | Freq: Every day | SUBCUTANEOUS | 2 refills | Status: DC

## 2020-04-30 MED ORDER — IRBESARTAN 75 MG PO TABS: 75.0000 mg | ORAL_TABLET | Freq: Every day | ORAL | 3 refills | Status: DC

## 2020-04-30 MED ORDER — ATORVASTATIN CALCIUM 40 MG PO TABS: 40.0000 mg | ORAL_TABLET | Freq: Every day | ORAL | 3 refills | Status: DC

## 2020-04-30 NOTE — Assessment & Plan Note (Signed)
A1C elevated today 10.0, up from 9.4 6 months ago.  Encouraged patient to continue to work on low carb diet and to take healthy snacks at work during the day.  Also encouraged patient to continue to check AM blood sugars and also check blood sugars at work when having symptoms.   - Monitor blood glucose levels and bring to next visit - Continue to be consistent with low carb diet and to monitor blood sugars - Continue Lantus 50U, Jardiance 25 mg, and Atorvastatin 40 mg daily - Start Victoza 0.6 mg daily for 1st week followed by 1.2 mg daily following - f/u in 1 month to discuss

## 2020-04-30 NOTE — Patient Instructions (Signed)
It was good to see you today.  Thank you for coming in.  Your Blood Pressure was very good today.  Great job!  We are refilling your medications restarting you on Victoza.  Take 0.6 mg for 1 week and 1.2 mg after that.  Please also write down blood glucose recordings and try to check at work if experiencing symptoms as well.  Please follow-up in 1 month to check on how we are doing with restarting Victoza and to discuss Colonoscopy.   Be Well, Dr Pecola Leisure

## 2020-04-30 NOTE — Progress Notes (Unsigned)
    SUBJECTIVE:   CHIEF COMPLAINT / HPI: Hypertension/Diabetes f/u  Patient indicates she is here for diabetes and hypertension follow-up.  Indicates she has been compliant with medicatons.  Has been checking blood glucose in morning and is usually in 120's.  Reports not adhering to diabetic diet over recent holidays but has more recnetly limiting carb intake. Endorses some blurry vision occasionally at work after eating snacks and usually will walk around some to get blood sugar down.  Denies chest pain, difficulty breathing, heart palpitations.  Patient indicates previously took Victoza in addition to current medications and felt this stopped due to episode of GI symptoms even though had previously been taking for awhile before this without issue.  PERTINENT  PMH / PSH: Hypertension, Type 2 Diabetes  OBJECTIVE:   BP 122/70   Pulse 84   Ht 5\' 6"  (1.676 m)   Wt 255 lb (115.7 kg)   SpO2 98%   BMI 41.16 kg/m    Physical Exam Constitutional:      General: She is not in acute distress.    Appearance: She is not ill-appearing.  HENT:     Head: Normocephalic and atraumatic.     Mouth/Throat:     Mouth: Mucous membranes are moist.  Cardiovascular:     Rate and Rhythm: Normal rate and regular rhythm.  Pulmonary:     Effort: Pulmonary effort is normal.     Breath sounds: Normal breath sounds.  Skin:    General: Skin is warm.  Neurological:     Mental Status: She is alert.  Psychiatric:        Mood and Affect: Mood normal.        Behavior: Behavior normal.     ASSESSMENT/PLAN:   HYPERTENSION, BENIGN SYSTEMIC Patient blood pressure well controlled today at 122/70.  Praised patient for effort to keep BP well controlled. - Will refill Irbesartan 75 mg and HCTZ 25 mg  DM (diabetes mellitus), type 2, uncontrolled (HCC) A1C elevated today 10.0, up from 9.4 6 months ago.  Encouraged patient to continue to work on low carb diet and to take healthy snacks at work during the day.  Also  encouraged patient to continue to check AM blood sugars and also check blood sugars at work when having symptoms.   - Monitor blood glucose levels and bring to next visit - Continue to be consistent with low carb diet and to monitor blood sugars - Continue Lantus 50U, Jardiance 25 mg, and Atorvastatin 40 mg daily - Start Victoza 0.6 mg daily for 1st week followed by 1.2 mg daily following - f/u in 1 month to discuss    Health Maintenance Patient has mammogram scheduled next week.  Plans to get Flu shot and COVID-19 Booster afterwards and plans to follow-up with OB-GYN for Pap Smear as last one in 2018. - Obtain CMP - f/u in 1 month to discuss colonoscopy as patient recently turned 50 y.o.   43, MD [facility]

## 2020-04-30 NOTE — Assessment & Plan Note (Signed)
Patient blood pressure well controlled today at 122/70.  Praised patient for effort to keep BP well controlled. - Will refill Irbesartan 75 mg and HCTZ 25 mg

## 2020-05-01 LAB — COMPREHENSIVE METABOLIC PANEL
ALT: 14 IU/L (ref 0–32)
AST: 25 IU/L (ref 0–40)
Albumin/Globulin Ratio: 1.2 (ref 1.2–2.2)
Albumin: 4.2 g/dL (ref 3.8–4.8)
Alkaline Phosphatase: 194 IU/L — ABNORMAL HIGH (ref 44–121)
BUN/Creatinine Ratio: 25 — ABNORMAL HIGH (ref 9–23)
BUN: 27 mg/dL — ABNORMAL HIGH (ref 6–24)
Bilirubin Total: 0.3 mg/dL (ref 0.0–1.2)
CO2: 20 mmol/L (ref 20–29)
Calcium: 9.5 mg/dL (ref 8.7–10.2)
Chloride: 98 mmol/L (ref 96–106)
Creatinine, Ser: 1.08 mg/dL — ABNORMAL HIGH (ref 0.57–1.00)
GFR calc Af Amer: 69 mL/min/{1.73_m2} (ref >59)
GFR calc non Af Amer: 60 mL/min/{1.73_m2} (ref >59)
Globulin, Total: 3.4 g/dL (ref 1.5–4.5)
Glucose: 164 mg/dL — ABNORMAL HIGH (ref 65–99)
Potassium: 4.5 mmol/L (ref 3.5–5.2)
Sodium: 137 mmol/L (ref 134–144)
Total Protein: 7.6 g/dL (ref 6.0–8.5)

## 2020-05-02 ENCOUNTER — Ambulatory Visit: Payer: 59 | Admitting: Podiatry

## 2020-05-02 ENCOUNTER — Encounter: Payer: Self-pay | Admitting: Podiatry

## 2020-05-02 ENCOUNTER — Other Ambulatory Visit: Payer: Self-pay

## 2020-05-02 ENCOUNTER — Ambulatory Visit (INDEPENDENT_AMBULATORY_CARE_PROVIDER_SITE_OTHER): Payer: 59

## 2020-05-02 DIAGNOSIS — M14672 Charcot's joint, left ankle and foot: Secondary | ICD-10-CM | POA: Diagnosis not present

## 2020-05-03 ENCOUNTER — Encounter: Payer: Self-pay | Admitting: Podiatry

## 2020-05-03 NOTE — Progress Notes (Signed)
Subjective:  Patient ID: Virginia [name], female    DOB: 10/10/1970,  MRN: [id number]  Chief Complaint  Patient presents with  . Foot Pain    Left foot pain. PT stated that the bottom of her foot is really tender and hurts to apply pressure    50 y.o. female presents for wound care.  Patient presents with complaint of left plantar midfoot pain.  Patient states it came out of nowhere.  Is really tender to touch.  It is red hot swollen.  Patient states is been on for a little bit of time.  She just wants to get eval make sure is not worsening.  She does not have any ulceration at this time.  It is tender to walk.  She denies any other acute complaints.  Review of Systems: Negative except as noted in the HPI. Denies N/V/F/Ch.  Past Medical History:  Diagnosis Date  . Diabetes mellitus   . Hypertension    on HCTZ  . Obesity   . Wears glasses     Current Outpatient Medications:  .  Alcohol Swabs (ALCOHOL PADS) 70 % PADS, 1 each by Does not apply route as directed., Disp: 90 each, Rfl: 3 .  atorvastatin (LIPITOR) 40 MG tablet, Take 1 tablet (40 mg total) by mouth daily., Disp: 90 tablet, Rfl: 3 .  Blood Glucose Monitoring Suppl (ONETOUCH VERIO) w/Device KIT, 1 kit by Does not apply route daily., Disp: 1 kit, Rfl: 1 .  empagliflozin (JARDIANCE) 25 MG TABS tablet, Take 1 tablet (25 mg total) by mouth daily., Disp: 90 tablet, Rfl: 3 .  gabapentin (NEURONTIN) 300 MG capsule, Take 2 capsules by mouth twice daily, Disp: 360 capsule, Rfl: 0 .  glucose blood (ONETOUCH VERIO) test strip, Use 3 times daily, Disp: 100 each, Rfl: 12 .  hydrochlorothiazide (HYDRODIURIL) 25 MG tablet, Take 1 tablet (25 mg total) by mouth daily., Disp: 90 tablet, Rfl: 3 .  Insulin Glargine (BASAGLAR KWIKPEN) 100 UNIT/ML, Inject 50 Units into the skin daily., Disp: 15 mL, Rfl: 2 .  Insulin Pen Needle 31G X 5 MM MISC, Use one needle per application, Disp: 144 each, Rfl: 6 .  irbesartan (AVAPRO) 75 MG tablet, Take 1 tablet (75  mg total) by mouth daily., Disp: 90 tablet, Rfl: 3 .  Lancets (ONETOUCH ULTRASOFT) lancets, Please use to check blood sugar 3 times daily before meals. E11.65, Disp: 100 each, Rfl: 12 .  liraglutide (VICTOZA) 18 MG/3ML SOPN, Inject 0.6 mg into the skin daily. 0.6 mg once daily for 1 week,then increase to 1.2 mg once daily,max 1.8  mg, Disp: 6 mL, Rfl: 3 .  Multiple Vitamins-Minerals (MULTIVITAMIN WITH MINERALS) tablet, Take 1 tablet by mouth daily. Reported on 07/09/2015, Disp: , Rfl:  .  ONETOUCH DELICA LANCETS FINE MISC, 1 each by Does not apply route 2 (two) times daily., Disp: 100 each, Rfl: 5 .  terbinafine (LAMISIL) 250 MG tablet, Take 1 tablet (250 mg total) by mouth daily., Disp: 90 tablet, Rfl: 0  Social History   Tobacco Use  Smoking Status Former Smoker  . Packs/day: 0.25  . Years: 7.00  . Pack years: 1.75  . Types: Cigarettes  . Start date: 03/18/1991  . Quit date: 05/03/1998  . Years since quitting: 22.0  Smokeless Tobacco Never Used  Tobacco Comment   Works at [employer] does NOT smoke.     Allergies  Allergen Reactions  . Liraglutide Nausea And Vomiting    Projectile Vomiting  .  Shrimp [Shellfish Allergy] Shortness Of Breath  . Ace Inhibitors Cough  . Metformin And Related Diarrhea and Nausea And Vomiting  . Rosuvastatin Other (See Comments)    Myalgia - Legs reported - resolved upon discontinuation.    Objective:  There were no vitals filed for this visit. There is no height or weight on file to calculate BMI. Constitutional Well developed. Well nourished.  Vascular Dorsalis pedis pulses palpable bilaterally. Posterior tibial pulses palpable bilaterally. Capillary refill normal to all digits.  No cyanosis or clubbing noted. Pedal hair growth normal.  Neurologic Normal speech. Oriented to person, place, and time. Protective sensation absent  Dermatologic  left Charcot acute flare with erythema swelling warmth on palpation clinically.  No gross dislocation  noted or appreciated.  No ulceration appreciated.  Orthopedic: No pain to palpation either foot.  History of partial hallux amp left side   Radiographs: 3 views of skeletally mature adult left foot patient has an acute Charcot breakdown of the Lisfranc.  It appears to be slightly worsening there is swelling/increasing soft tissue volume/density across the dorsal aspect of the foot.  Patient has an increase in negative cuboid height from previous x-rays. Assessment:   1. Charcot's joint of left foot    Plan:  Patient was evaluated and treated and all questions answered.  Left Charcot acute flare -I explained the patient the etiology of Charcot and its relationship with acute flareup.  Patient has been working constantly and harder on her feet with more greater than 12-hour shifts which could have led to more stress on the foot which could have placed her in an acute Charcot again.  She is doing well overall she does not have any soft tissue loss/ulcer.  At this time it appears that patient may have an acute flareup of Charcot with right hallux swollen midfoot.  I encouraged her to place her self immediately in the cam boot and become nonweightbearing to the left lower extremity until the flareup has calmed down.  She will also be given a note to stay out of work for now.  Patient states understanding. -I will repeat another x-ray during next clinical visit to assess for consolidation.  Patient states understanding.  Boneta Lucks, DPM    No follow-ups on file.     No follow-ups on file.

## 2020-05-08 ENCOUNTER — Ambulatory Visit
Admission: RE | Admit: 2020-05-08 | Discharge: 2020-05-08 | Disposition: A | Discharge status: Home / Self Care | Payer: 59 | Source: Ambulatory Visit | Attending: Obstetrics and Gynecology | Admitting: Obstetrics and Gynecology

## 2020-05-08 ENCOUNTER — Other Ambulatory Visit: Payer: Self-pay

## 2020-05-08 DIAGNOSIS — Z1231 Encounter for screening mammogram for malignant neoplasm of breast: Secondary | ICD-10-CM

## 2020-05-14 ENCOUNTER — Other Ambulatory Visit: Payer: Self-pay | Admitting: Interventional Radiology

## 2020-05-14 DIAGNOSIS — I739 Peripheral vascular disease, unspecified: Secondary | ICD-10-CM

## 2020-05-30 ENCOUNTER — Other Ambulatory Visit: Payer: Self-pay

## 2020-05-30 ENCOUNTER — Ambulatory Visit: Payer: 59 | Admitting: Podiatry

## 2020-05-30 ENCOUNTER — Ambulatory Visit (INDEPENDENT_AMBULATORY_CARE_PROVIDER_SITE_OTHER): Payer: 59

## 2020-05-30 ENCOUNTER — Encounter: Payer: Self-pay | Admitting: Podiatry

## 2020-05-30 DIAGNOSIS — M14672 Charcot's joint, left ankle and foot: Secondary | ICD-10-CM

## 2020-05-30 DIAGNOSIS — B351 Tinea unguium: Secondary | ICD-10-CM

## 2020-05-30 DIAGNOSIS — M722 Plantar fascial fibromatosis: Secondary | ICD-10-CM | POA: Diagnosis not present

## 2020-05-30 DIAGNOSIS — Z79899 Other long term (current) drug therapy: Secondary | ICD-10-CM

## 2020-05-30 LAB — HEPATIC FUNCTION PANEL
AG Ratio: 1.1 (calc) (ref 1.0–2.5)
ALT: 10 U/L (ref 6–29)
AST: 12 U/L (ref 10–35)
Albumin: 4 g/dL (ref 3.6–5.1)
Alkaline phosphatase (APISO): 156 U/L — ABNORMAL HIGH (ref 37–153)
Bilirubin, Direct: 0.1 mg/dL (ref 0.0–0.2)
Globulin: 3.6 g/dL (calc) (ref 1.9–3.7)
Indirect Bilirubin: 0.2 mg/dL (calc) (ref 0.2–1.2)
Total Bilirubin: 0.3 mg/dL (ref 0.2–1.2)
Total Protein: 7.6 g/dL (ref 6.1–8.1)

## 2020-06-05 ENCOUNTER — Encounter: Payer: Self-pay | Admitting: Podiatry

## 2020-06-05 MED ORDER — TERBINAFINE HCL 250 MG PO TABS: 250.0000 mg | ORAL_TABLET | Freq: Every day | ORAL | 0 refills | Status: DC

## 2020-06-05 NOTE — Progress Notes (Signed)
Subjective:  Patient ID: Virginia [name], female    DOB: 1970-09-26,  MRN: [id number]  Chief Complaint  Patient presents with  . Foot Pain    Left foot pain     50 y.o. female presents for wound care.  Patient presents for follow-up of left Charcot foot.  Patient states she is doing a lot better.  She does not have red hot swelling and pain associate with it.  Occasional deep discomfort.  She now has more pain on the right side with the right heel pain.  Patient may have flared of the plantar fasciitis.  She has not done any treatment options for it.  She is not doing any kind of injection or bracing.  She also has tertiary complaint of bilateral onychomycosis to the toenails x10.  Mild pain on palpation.  She would like for me to discuss treatment options for it.  She would like to undergo Lamisil therapy.  Review of Systems: Negative except as noted in the HPI. Denies N/V/F/Ch.  Past Medical History:  Diagnosis Date  . Diabetes mellitus   . Hypertension    on HCTZ  . Obesity   . Wears glasses     Current Outpatient Medications:  .  Alcohol Swabs (ALCOHOL PADS) 70 % PADS, 1 each by Does not apply route as directed., Disp: 90 each, Rfl: 3 .  atorvastatin (LIPITOR) 40 MG tablet, Take 1 tablet (40 mg total) by mouth daily., Disp: 90 tablet, Rfl: 3 .  Blood Glucose Monitoring Suppl (ONETOUCH VERIO) w/Device KIT, 1 kit by Does not apply route daily., Disp: 1 kit, Rfl: 1 .  empagliflozin (JARDIANCE) 25 MG TABS tablet, Take 1 tablet (25 mg total) by mouth daily., Disp: 90 tablet, Rfl: 3 .  gabapentin (NEURONTIN) 300 MG capsule, Take 2 capsules by mouth twice daily, Disp: 360 capsule, Rfl: 0 .  glucose blood (ONETOUCH VERIO) test strip, Use 3 times daily, Disp: 100 each, Rfl: 12 .  hydrochlorothiazide (HYDRODIURIL) 25 MG tablet, Take 1 tablet (25 mg total) by mouth daily., Disp: 90 tablet, Rfl: 3 .  Insulin Glargine (BASAGLAR KWIKPEN) 100 UNIT/ML, Inject 50 Units into the skin daily., Disp: 15  mL, Rfl: 2 .  Insulin Pen Needle 31G X 5 MM MISC, Use one needle per application, Disp: 956 each, Rfl: 6 .  irbesartan (AVAPRO) 75 MG tablet, Take 1 tablet (75 mg total) by mouth daily., Disp: 90 tablet, Rfl: 3 .  Lancets (ONETOUCH ULTRASOFT) lancets, Please use to check blood sugar 3 times daily before meals. E11.65, Disp: 100 each, Rfl: 12 .  liraglutide (VICTOZA) 18 MG/3ML SOPN, Inject 0.6 mg into the skin daily. 0.6 mg once daily for 1 week,then increase to 1.2 mg once daily,max 1.8  mg, Disp: 6 mL, Rfl: 3 .  Multiple Vitamins-Minerals (MULTIVITAMIN WITH MINERALS) tablet, Take 1 tablet by mouth daily. Reported on 07/09/2015, Disp: , Rfl:  .  ONETOUCH DELICA LANCETS FINE MISC, 1 each by Does not apply route 2 (two) times daily., Disp: 100 each, Rfl: 5 .  terbinafine (LAMISIL) 250 MG tablet, Take 1 tablet (250 mg total) by mouth daily., Disp: 90 tablet, Rfl: 0  Social History   Tobacco Use  Smoking Status Former Smoker  . Packs/day: 0.25  . Years: 7.00  . Pack years: 1.75  . Types: Cigarettes  . Start date: 03/18/1991  . Quit date: 05/03/1998  . Years since quitting: 22.1  Smokeless Tobacco Never Used  Tobacco Comment   Works at [employer] -  AND does NOT smoke.     Allergies  Allergen Reactions  . Liraglutide Nausea And Vomiting    Projectile Vomiting  . Shrimp [Shellfish Allergy] Shortness Of Breath  . Ace Inhibitors Cough  . Metformin And Related Diarrhea and Nausea And Vomiting  . Rosuvastatin Other (See Comments)    Myalgia - Legs reported - resolved upon discontinuation.    Objective:  There were no vitals filed for this visit. There is no height or weight on file to calculate BMI. Constitutional Well developed. Well nourished.  Vascular Dorsalis pedis pulses palpable bilaterally. Posterior tibial pulses palpable bilaterally. Capillary refill normal to all digits.  No cyanosis or clubbing noted. Pedal hair growth normal.  Neurologic Normal speech. Oriented to person,  place, and time. Protective sensation absent  Dermatologic  left Charcot acute flare with erythema swelling warmth on palpation clinically.  No gross dislocation noted or appreciated.  No ulceration appreciated. Bilateral hallux thickened elongated dystrophic toenails x10.  Mild pain on palpation.  Tender to palpation at the calcaneal tuber right. No pain with calcaneal squeeze right. Ankle ROM diminished range of motion right. Silfverskiold Test: positive right.  Orthopedic: No pain to palpation either foot.  History of partial hallux amp left side   Radiographs: 3 views of skeletally mature adult left foot patient has an acute Charcot breakdown of the Lisfranc.  Compared to previous films it appears that his bones have consolidated in place.  We will continue to monitor the progression of it. Assessment:   1. Charcot's joint of left foot   2. Onychomycosis due to dermatophyte   3. Encounter for long-term (current) use of high-risk medication   4. Plantar fasciitis, right    Plan:  Patient was evaluated and treated and all questions answered.  Left Charcot acute flare -Radiographically as well as clinically I believe patient's Charcot flareup seems to be going more in a consolidation phase.  At this time patient can begin returning to work.  Her pain is improved considerably to the left foot.  She states overall she is doing much better from the left foot.  There is no soft tissue defect/ulceration noted to the left side.  I discussed with her that if she has another flareup immediately makers of nonweightbearing.  Ultimately I discussed with her she will benefit from a Crow walker however patient does not wish to be placed in a Crow walker given the nature of her work.  Her work will also allow her to be in a full walker.  Toenails x10 onychomycosis -Educated the patient on the etiology of onychomycosis and various treatment options associated with improving the fungal load.  I explained  to the patient that there is 3 treatment options available to treat the onychomycosis including topical, p.o., laser treatment.  Patient elected to undergo p.o. options with Lamisil/terbinafine therapy.  In order for me to start the medication therapy, I explained to the patient the importance of evaluating the liver and obtaining the liver function test.  Once the liver function test comes back normal I will start him on 62-monthcourse of Lamisil therapy.  Patient understood all risk and would like to proceed with Lamisil therapy.  I have asked the patient to immediately stop the Lamisil therapy if she has any reactions to it and call the office or go to the emergency room right away.  Patient states understanding   Plantar Fasciitis, right - XR reviewed as above.  - Educated on icing and stretching. Instructions given.  -  Injection delivered to the plantar fascia as below. - DME: Plantar Fascial Brace - Pharmacologic management: None  Procedure: Injection Tendon/Ligament Location: Right plantar fascia at the glabrous junction; medial approach. Skin Prep: alcohol Injectate: 0.5 cc 0.5% marcaine plain, 0.5 cc of 1% Lidocaine, 0.5 cc kenalog 10. Disposition: Patient tolerated procedure well. Injection site dressed with a band-aid.  No follow-ups on file.  Boneta Lucks, DPM    No follow-ups on file.     No follow-ups on file.

## 2020-06-14 ENCOUNTER — Ambulatory Visit: Payer: 59 | Admitting: Family Medicine

## 2020-06-17 DIAGNOSIS — E11610 Type 2 diabetes mellitus with diabetic neuropathic arthropathy: Secondary | ICD-10-CM | POA: Insufficient documentation

## 2020-06-17 NOTE — Progress Notes (Deleted)
   Subjective:   Patient ID: Virginia [name]    DOB: 1971/01/14, 50 y.o. female   MRN: [id number]  Virginia [name] is a 49 y.o. female with a history of HTN, DM with neuropathy, BPPV, cervical polyp, obesity, HLD here for diabetes follow up  Diabetes: Last three A1C's below. Currently on Victoza 0.6 or 1.2***mg QD, Basaglar 50U QD, Jardiance 25mg  QD. Endorses compliance. Notes CBGs range ***. Denies any hypoglycemia. Denies any polyuria, polydipsia, polyphagia. Due for Diabetic foot exam and eye exam. Dexcom: document in plan "A1C ***. Currently monitoring and taking >3 injection per day". Initial RX send message to RN team. Refills - just send to pharmacy "Patient is a good candidate for a CGM device given history of hypoglycemia and using 3 insulin injections per day."  - Continuous Blood Gluc Receiver (DEXCOM G6 RECEIVER) DEVI  - Continuous Blood Gluc Sensor (DEXCOM G6 SENSOR) MISC  - Continuous Blood Gluc Transmit (DEXCOM G6 TRANSMITTER) MISC Lantus/Novolug/Humalog Kwikpens/GLP-1 pens - still need to order BD Insulin Pen Needle (B-D UF III MINI PEN NEEDLES) 31G X 5 MM MISC  Lab Results  Component Value Date   HGBA1C 10.0 (A) 04/30/2020   HGBA1C 9.3 (A) 10/25/2019   HGBA1C 9.1 (A) 06/02/2019    HTN:    today. Currently on Irbesartan 75mg  QD, HCTZ 25mg  QD. Endorses compliance. Non-smoker/Current everyday smoker***. Denies any chest pain, SOB, vision changes, or headaches.   Lab Results  Component Value Date   CREATININE 1.08 (H) 04/30/2020   CREATININE 0.92 03/15/2019   CREATININE 0.62 03/31/2018    HLD: Last lipid panel below. Currently on Atorvastatin 40mg  QD. Endorses compliance. Denies any muscles aches or weakness.  Lab Results  Component Value Date   CHOL 114 10/25/2019   HDL 42 10/25/2019   LDLCALC 51 10/25/2019   LDLDIRECT 99 12/05/2011   TRIG 114 10/25/2019   CHOLHDL 2.7 10/25/2019   Health Maintenance: Health Maintenance Due  Topic  . OPHTHALMOLOGY EXAM   .  COLONOSCOPY (Pts 45-61yrs Insurance coverage will need to be confirmed)   . COVID-19 Vaccine (3 - Booster for 12/07/2011 series)  . FOOT EXAM    Review of Systems:  Per HPI.   Objective:   There were no vitals taken for this visit. Vitals and nursing note reviewed.  General: pleasant ***, sitting comfortably in exam chair, well nourished, well developed, in no acute distress with non-toxic appearance HEENT: normocephalic, atraumatic, moist mucous membranes, oropharynx clear without erythema or exudate, TM normal bilaterally  Neck: supple, non-tender without lymphadenopathy CV: regular rate and rhythm without murmurs, rubs, or gallops, no lower extremity edema, 2+ radial and pedal pulses bilaterally Lungs: clear to auscultation bilaterally with normal work of breathing on room air Resp: breathing comfortably on room air, speaking in full sentences Abdomen: soft, non-tender, non-distended, no masses or organomegaly palpable, normoactive bowel sounds Skin: warm, dry, no rashes or lesions Extremities: warm and well perfused, normal tone MSK: ROM grossly intact, strength intact, gait normal Neuro: Alert and oriented, speech normal  Assessment & Plan:   No problem-specific Assessment & Plan notes found for this encounter.  No orders of the defined types were placed in this encounter.  No orders of the defined types were placed in this encounter.   {    This will disappear when note is signed, click to select method of visit    :1}  12/25/2019, DO PGY-3, [facility] 06/17/2020 10:21 AM

## 2020-06-18 ENCOUNTER — Ambulatory Visit: Payer: 59 | Admitting: Family Medicine

## 2020-06-18 DIAGNOSIS — E11610 Type 2 diabetes mellitus with diabetic neuropathic arthropathy: Secondary | ICD-10-CM

## 2020-08-23 ENCOUNTER — Other Ambulatory Visit: Payer: Self-pay | Admitting: Family Medicine

## 2020-08-23 ENCOUNTER — Other Ambulatory Visit: Payer: Self-pay | Admitting: Podiatry

## 2020-09-19 ENCOUNTER — Other Ambulatory Visit: Payer: Self-pay | Admitting: Podiatry

## 2020-09-20 NOTE — Telephone Encounter (Signed)
Please advise 

## 2020-10-01 ENCOUNTER — Other Ambulatory Visit: Payer: Self-pay

## 2020-10-01 DIAGNOSIS — E1165 Type 2 diabetes mellitus with hyperglycemia: Secondary | ICD-10-CM

## 2020-10-01 MED ORDER — BASAGLAR KWIKPEN 100 UNIT/ML ~~LOC~~ SOPN: 50.0000 [IU] | PEN_INJECTOR | Freq: Every day | SUBCUTANEOUS | 2 refills | Status: DC

## 2020-10-01 MED ORDER — LIRAGLUTIDE 18 MG/3ML ~~LOC~~ SOPN: 0.6000 mg | PEN_INJECTOR | Freq: Every day | SUBCUTANEOUS | 3 refills | Status: DC

## 2020-10-03 ENCOUNTER — Ambulatory Visit: Payer: 59 | Admitting: Podiatry

## 2020-10-03 ENCOUNTER — Other Ambulatory Visit: Payer: Self-pay

## 2020-10-03 DIAGNOSIS — M2042 Other hammer toe(s) (acquired), left foot: Secondary | ICD-10-CM

## 2020-10-03 DIAGNOSIS — M2041 Other hammer toe(s) (acquired), right foot: Secondary | ICD-10-CM | POA: Diagnosis not present

## 2020-10-03 DIAGNOSIS — M14672 Charcot's joint, left ankle and foot: Secondary | ICD-10-CM | POA: Diagnosis not present

## 2020-10-03 DIAGNOSIS — Q666 Other congenital valgus deformities of feet: Secondary | ICD-10-CM | POA: Diagnosis not present

## 2020-10-09 ENCOUNTER — Encounter: Payer: Self-pay | Admitting: Podiatry

## 2020-10-09 NOTE — Progress Notes (Signed)
Subjective:  Patient ID: Virginia [name], female    DOB: [birth year],  MRN: [id number]  Chief Complaint  Patient presents with   Foot Pain    Left foot pain  PT stated that she is doing well     50 y.o. female presents for wound care.  Patient presents with a follow-up of left Charcot foot with evaluation of the nail fungus.  She states she is completed 1 course of Lamisil.  She is on her second course.  She states it is definitely helping.  She also would like to get her new pair of diabetic insoles or shoes.  She denies any other acute complaints  Review of Systems: Negative except as noted in the HPI. Denies N/V/F/Ch.  Past Medical History:  Diagnosis Date   Diabetes mellitus    Hypertension    on HCTZ   Obesity    Wears glasses     Current Outpatient Medications:    Alcohol Swabs (ALCOHOL PADS) 70 % PADS, 1 each by Does not apply route as directed., Disp: 90 each, Rfl: 3   atorvastatin (LIPITOR) 40 MG tablet, Take 1 tablet (40 mg total) by mouth daily., Disp: 90 tablet, Rfl: 3   Blood Glucose Monitoring Suppl (ONETOUCH VERIO) w/Device KIT, 1 kit by Does not apply route daily., Disp: 1 kit, Rfl: 1   empagliflozin (JARDIANCE) 25 MG TABS tablet, Take 1 tablet (25 mg total) by mouth daily., Disp: 90 tablet, Rfl: 3   gabapentin (NEURONTIN) 300 MG capsule, Take 2 capsules by mouth twice daily, Disp: 360 capsule, Rfl: 0   glucose blood (ONETOUCH VERIO) test strip, Use 3 times daily, Disp: 100 each, Rfl: 12   hydrochlorothiazide (HYDRODIURIL) 25 MG tablet, Take 1 tablet (25 mg total) by mouth daily., Disp: 90 tablet, Rfl: 3   Insulin Glargine (BASAGLAR KWIKPEN) 100 UNIT/ML, Inject 50 Units into the skin daily., Disp: 15 mL, Rfl: 2   Insulin Pen Needle 31G X 5 MM MISC, Use one needle per application, Disp: 782 each, Rfl: 6   irbesartan (AVAPRO) 75 MG tablet, Take 1 tablet (75 mg total) by mouth daily., Disp: 90 tablet, Rfl: 3   Lancets (ONETOUCH ULTRASOFT) lancets, Please use to check blood  sugar 3 times daily before meals. E11.65, Disp: 100 each, Rfl: 12   liraglutide (VICTOZA) 18 MG/3ML SOPN, Inject 0.6 mg into the skin daily. 0.6 mg once daily for 1 week,then increase to 1.2 mg once daily,max 1.8  mg, Disp: 6 mL, Rfl: 3   Multiple Vitamins-Minerals (MULTIVITAMIN WITH MINERALS) tablet, Take 1 tablet by mouth daily. Reported on 07/09/2015, Disp: , Rfl:    ONETOUCH DELICA LANCETS FINE MISC, 1 each by Does not apply route 2 (two) times daily., Disp: 100 each, Rfl: 5   terbinafine (LAMISIL) 250 MG tablet, Take 1 tablet (250 mg total) by mouth daily., Disp: 90 tablet, Rfl: 0   terbinafine (LAMISIL) 250 MG tablet, Take 1 tablet by mouth once daily, Disp: 30 tablet, Rfl: 0  Social History   Tobacco Use  Smoking Status Former   Packs/day: 0.25   Years: 7.00   Pack years: 1.75   Types: Cigarettes   Start date: 03/18/1991   Quit date: 05/03/1998   Years since quitting: 22.4  Smokeless Tobacco Never  Tobacco Comments   Works at [employer] does NOT smoke.     Allergies  Allergen Reactions   Liraglutide Nausea And Vomiting    Projectile Vomiting   Shrimp [Shellfish Allergy] Shortness Of  Breath   Ace Inhibitors Cough   Metformin And Related Diarrhea and Nausea And Vomiting   Rosuvastatin Other (See Comments)    Myalgia - Legs reported - resolved upon discontinuation.    Objective:  There were no vitals filed for this visit. There is no height or weight on file to calculate BMI. Constitutional Well developed. Well nourished.  Vascular Dorsalis pedis pulses palpable bilaterally. Posterior tibial pulses palpable bilaterally. Capillary refill normal to all digits.  No cyanosis or clubbing noted. Pedal hair growth normal.  Neurologic Normal speech. Oriented to person, place, and time. Protective sensation absent  Dermatologic  left Charcot acute flare with erythema swelling warmth on palpation clinically.  No gross dislocation noted or appreciated.  No ulceration  appreciated. Bilateral hallux thickened elongated dystrophic toenails x10.  Mild pain on palpation.  No further tender to palpation at the calcaneal tuber right. No pain with calcaneal squeeze right. Ankle ROM diminished range of motion right. Silfverskiold Test: positive right.  Orthopedic: No pain to palpation either foot.  History of partial hallux amp left side.  Hammertoe contractures noted 2 through 5 bilaterally   Radiographs: 3 views of skeletally mature adult left foot patient has an acute Charcot breakdown of the Lisfranc.  Compared to previous films it appears that his bones have consolidated in place.  We will continue to monitor the progression of it. Assessment:   1. Charcot's joint of left foot   2. Pes planovalgus   3. Hammertoe, bilateral     Plan:  Patient was evaluated and treated and all questions answered.  Left Charcot acute flare -Radiographically as well as clinically I believe patient's Charcot flareup seems to be going more in a consolidation phase.  At this time patient can begin returning to work.  Her pain is improved considerably to the left foot.  She states overall she is doing much better from the left foot.  There is no soft tissue defect/ulceration noted to the left side.  I discussed with her that if she has another flareup immediately makers of nonweightbearing.  Ultimately I discussed with her she will benefit from a Crow walker however patient does not wish to be placed in a Crow walker given the nature of her work.  Her work will also allow her to be in a full walker.  Toenails x10 onychomycosis~second round -Educated the patient on the etiology of onychomycosis and various treatment options associated with improving the fungal load.  I explained to the patient that there is 3 treatment options available to treat the onychomycosis including topical, p.o., laser treatment.  Patient elected to undergo p.o. options with Lamisil/terbinafine therapy.  In  order for me to start the medication therapy, I explained to the patient the importance of evaluating the liver and obtaining the liver function test.  Once the liver function test comes back normal I will start him on 3-month course of Lamisil therapy.  Patient understood all risk and would like to proceed with Lamisil therapy.  I have asked the patient to immediately stop the Lamisil therapy if she has any reactions to it and call the office or go to the emergency room right away.  Patient states understanding   Hammertoe 2 through 5 bilateral -Explained patient the etiology of hammertoe versus treatment options were extensively discussed.  Given the deformities in the setting of diabetes and Charcot I believe patient will benefit from diabetic shoes/insoles. -She will be scheduled to see EJ for diabetic insoles. No follow-ups on   file.  Boneta Lucks, DPM    No follow-ups on file.     No follow-ups on file.

## 2020-11-16 ENCOUNTER — Ambulatory Visit (INDEPENDENT_AMBULATORY_CARE_PROVIDER_SITE_OTHER): Payer: 59

## 2020-11-16 ENCOUNTER — Telehealth: Payer: Self-pay | Admitting: Podiatry

## 2020-11-16 ENCOUNTER — Other Ambulatory Visit: Payer: Self-pay

## 2020-11-16 ENCOUNTER — Encounter (HOSPITAL_COMMUNITY): Payer: Self-pay

## 2020-11-16 ENCOUNTER — Ambulatory Visit: Payer: 59 | Admitting: Podiatry

## 2020-11-16 ENCOUNTER — Other Ambulatory Visit: Payer: 59

## 2020-11-16 ENCOUNTER — Inpatient Hospital Stay (HOSPITAL_COMMUNITY)
Admission: EM | Admit: 2020-11-16 | Discharge: 2020-11-19 | DRG: 617 | Disposition: A | Discharge status: Home / Self Care | Payer: 59 | Attending: Family Medicine | Admitting: Family Medicine

## 2020-11-16 DIAGNOSIS — L97514 Non-pressure chronic ulcer of other part of right foot with necrosis of bone: Secondary | ICD-10-CM | POA: Diagnosis not present

## 2020-11-16 DIAGNOSIS — Z751 Person awaiting admission to adequate facility elsewhere: Secondary | ICD-10-CM | POA: Diagnosis not present

## 2020-11-16 DIAGNOSIS — Z89431 Acquired absence of right foot: Secondary | ICD-10-CM

## 2020-11-16 DIAGNOSIS — L02611 Cutaneous abscess of right foot: Secondary | ICD-10-CM

## 2020-11-16 DIAGNOSIS — Z8249 Family history of ischemic heart disease and other diseases of the circulatory system: Secondary | ICD-10-CM

## 2020-11-16 DIAGNOSIS — M869 Osteomyelitis, unspecified: Secondary | ICD-10-CM

## 2020-11-16 DIAGNOSIS — L0390 Cellulitis, unspecified: Secondary | ICD-10-CM | POA: Diagnosis present

## 2020-11-16 DIAGNOSIS — Z87891 Personal history of nicotine dependence: Secondary | ICD-10-CM

## 2020-11-16 DIAGNOSIS — I10 Essential (primary) hypertension: Secondary | ICD-10-CM | POA: Diagnosis present

## 2020-11-16 DIAGNOSIS — E1165 Type 2 diabetes mellitus with hyperglycemia: Secondary | ICD-10-CM

## 2020-11-16 DIAGNOSIS — L03031 Cellulitis of right toe: Secondary | ICD-10-CM

## 2020-11-16 DIAGNOSIS — Z20822 Contact with and (suspected) exposure to covid-19: Secondary | ICD-10-CM | POA: Diagnosis present

## 2020-11-16 DIAGNOSIS — Z6841 Body Mass Index (BMI) 40.0 and over, adult: Secondary | ICD-10-CM

## 2020-11-16 DIAGNOSIS — Z91013 Allergy to seafood: Secondary | ICD-10-CM

## 2020-11-16 DIAGNOSIS — E1142 Type 2 diabetes mellitus with diabetic polyneuropathy: Secondary | ICD-10-CM | POA: Diagnosis present

## 2020-11-16 DIAGNOSIS — R748 Abnormal levels of other serum enzymes: Secondary | ICD-10-CM | POA: Diagnosis present

## 2020-11-16 DIAGNOSIS — E1169 Type 2 diabetes mellitus with other specified complication: Secondary | ICD-10-CM | POA: Diagnosis not present

## 2020-11-16 DIAGNOSIS — Z888 Allergy status to other drugs, medicaments and biological substances status: Secondary | ICD-10-CM

## 2020-11-16 DIAGNOSIS — Z79899 Other long term (current) drug therapy: Secondary | ICD-10-CM

## 2020-11-16 DIAGNOSIS — Z803 Family history of malignant neoplasm of breast: Secondary | ICD-10-CM

## 2020-11-16 DIAGNOSIS — E785 Hyperlipidemia, unspecified: Secondary | ICD-10-CM | POA: Diagnosis present

## 2020-11-16 DIAGNOSIS — Z7984 Long term (current) use of oral hypoglycemic drugs: Secondary | ICD-10-CM

## 2020-11-16 DIAGNOSIS — L97519 Non-pressure chronic ulcer of other part of right foot with unspecified severity: Secondary | ICD-10-CM | POA: Diagnosis present

## 2020-11-16 DIAGNOSIS — Z833 Family history of diabetes mellitus: Secondary | ICD-10-CM

## 2020-11-16 DIAGNOSIS — Z83438 Family history of other disorder of lipoprotein metabolism and other lipidemia: Secondary | ICD-10-CM

## 2020-11-16 DIAGNOSIS — E11610 Type 2 diabetes mellitus with diabetic neuropathic arthropathy: Secondary | ICD-10-CM | POA: Diagnosis present

## 2020-11-16 DIAGNOSIS — E11621 Type 2 diabetes mellitus with foot ulcer: Secondary | ICD-10-CM | POA: Diagnosis present

## 2020-11-16 DIAGNOSIS — Z794 Long term (current) use of insulin: Secondary | ICD-10-CM

## 2020-11-16 LAB — COMPREHENSIVE METABOLIC PANEL
ALT: 17 U/L (ref 0–44)
AST: 16 U/L (ref 15–41)
Albumin: 3.9 g/dL (ref 3.5–5.0)
Alkaline Phosphatase: 157 U/L — ABNORMAL HIGH (ref 38–126)
Anion gap: 10 (ref 5–15)
BUN: 25 mg/dL — ABNORMAL HIGH (ref 6–20)
CO2: 27 mmol/L (ref 22–32)
Calcium: 9.7 mg/dL (ref 8.9–10.3)
Chloride: 101 mmol/L (ref 98–111)
Creatinine, Ser: 1.03 mg/dL — ABNORMAL HIGH (ref 0.44–1.00)
GFR, Estimated: >60 mL/min (ref >60)
Glucose, Bld: 206 mg/dL — ABNORMAL HIGH (ref 70–99)
Potassium: 4.2 mmol/L (ref 3.5–5.1)
Sodium: 138 mmol/L (ref 135–145)
Total Bilirubin: 0.7 mg/dL (ref 0.3–1.2)
Total Protein: 8 g/dL (ref 6.5–8.1)

## 2020-11-16 LAB — LACTIC ACID, PLASMA
Lactic Acid, Venous: 1.2 mmol/L (ref 0.5–1.9)
Lactic Acid, Venous: 1 mmol/L (ref 0.5–1.9)

## 2020-11-16 LAB — CBC WITH DIFFERENTIAL/PLATELET
Abs Immature Granulocytes: 0.03 10*3/uL (ref 0.00–0.07)
Basophils Absolute: 0.1 10*3/uL (ref 0.0–0.1)
Basophils Relative: 1 %
Eosinophils Absolute: 0.2 10*3/uL (ref 0.0–0.5)
Eosinophils Relative: 2 %
HCT: 43.8 % (ref 36.0–46.0)
Hemoglobin: 13.4 g/dL (ref 12.0–15.0)
Immature Granulocytes: 0 %
Lymphocytes Relative: 31 %
Lymphs Abs: 3 10*3/uL (ref 0.7–4.0)
MCH: 24 pg — ABNORMAL LOW (ref 26.0–34.0)
MCHC: 30.6 g/dL (ref 30.0–36.0)
MCV: 78.4 fL — ABNORMAL LOW (ref 80.0–100.0)
Monocytes Absolute: 0.5 10*3/uL (ref 0.1–1.0)
Monocytes Relative: 5 %
Neutro Abs: 5.9 10*3/uL (ref 1.7–7.7)
Neutrophils Relative %: 61 %
Platelets: 272 10*3/uL (ref 150–400)
RBC: 5.59 MIL/uL — ABNORMAL HIGH (ref 3.87–5.11)
RDW: 15.9 % — ABNORMAL HIGH (ref 11.5–15.5)
WBC: 9.7 10*3/uL (ref 4.0–10.5)
nRBC: 0 % (ref 0.0–0.2)

## 2020-11-16 MED ORDER — PIPERACILLIN-TAZOBACTAM 3.375 G IVPB 30 MIN
3.3750 g | Freq: Once | INTRAVENOUS | Status: AC
  Administered 2020-11-16: 3.375 g via INTRAVENOUS
  Filled 2020-11-16: qty 50

## 2020-11-16 NOTE — ED Triage Notes (Signed)
Pt wears steel toed shoes at work and has developed an infection in right foot. Pt sent from podiatrist office for MRI and IV anbx per pt.

## 2020-11-16 NOTE — Telephone Encounter (Signed)
Per Sam @ [employer] L3020/L5000 are valid and billable codes and pt has no deductible. Covered @ 100% under DME  1 every 3 years for dme but does not apply to inserts.. No authorization is needed... ref # [id number]

## 2020-11-16 NOTE — H&P (Addendum)
[facility] Admission History and Physical Service Pager: [phone]  Patient name: Virginia [name] Medical record number: [id number] Date of birth: December 22, 1970 Age: 50 y.o. Gender: female  Primary Care Provider: Gerlene Fee, DO Consultants: Podiatry  Code Status: Full  Preferred Emergency Contact: Rubbie Battiest [phone]  Chief Complaint: Toe infection  Assessment and Plan: Virginia [name] is a 50 y.o. female presenting with right great toe infection.PMH is significant for Charcot joint, type 2 diabetes mellitus uncontrolled, hyperlipidemia, hypertension, BPPV, and history of cervical polyp.  Osteomyelitis versus cellulitis right great toe Followed by Dr. Posey Pronto in podiatry, concern for osteomyelitis given x-ray and exam. Right foot x-ray in office showed osteomyelitic changes noted in the distal phalanx of right foot without soft tissue gas or emphysema or foreign body.  In the emergency department patient was afebrile, normal white blood cell count, normal lactic acid, no signs of sepsis.  Plan for possible amputation of right hallux on Monday depending on MRI. She has had left great toe amputated in the past as well. Some soft tissue swelling around right great toe on physical examination however no skin changes in these areas. Can monitor for cellulitic changes. -Admit to [facility], attending Dr. Thompson Grayer -Follow-up MRI of right foot -IV Zosyn in ED, switch to vancomycin, cefepime, Flagyl 500 mg for MRSA and anaerobic coverage, as well as to minimize nephrotoxic agents. -Monitor swelling and consider marking area on skin if any color changes or demarcations noted -Repeat CBC, CMP in a.m. -Podiatry following, appreciate recommendations -PT/OT evaluate and treat after surgery has occurred -Heart healthy/carb modified diet as surgery will not likely occur until Monday morning -Heparin for DVT PPx due to unknown timing of surgery though it will likely be Monday  according to the patient's discussion with her podiatrist  Type 2 Diabetes Mellitus, uncontrolled -Home medications include Jardiance 25 mg daily, Insulin glargine 50 units daily.  Patient not taking Victoza due to GI intolerance.  Continue Jardiance 25 mg daily and 25 units glargine. -Sliding scale insulin -Last A1c of 10 in February 2022 -Repeat A1c today -Repeat creatinine   Elevated alkaline phosphatase On labs today was 157.  This is likely secondary to osteomyelitis, no abdominal pain on examination today low suspicion for gallbladder pathology -monitor on CMP  Charcot Joint of Left Foot Has had left great toe amputated.  Currently stable and following with podiatry.  Hyperlipidemia -Continue home medication Atorvastatin 40 mg daily  Hypertension Home medications include irbesartan, hydrochlorothiazide -Continue home medications -Blood pressure ranges 144-131/70-92, monitor  BPPV -Resolved in 2020, currently stable  FEN/GI: Heart healthy carb modified diet Prophylaxis: Heparin  Disposition: Med-surg  History of Present Illness:  Virginia [name] is a 50 y.o. female presenting with concern for osteomyelitis of the toe.   She went to podiatry earlier and was recommended to go to the ED for concern of osteomyelitis on exam and imaging. She states she started getting a callus of her right great toe and some swelling last week, she tried to remove it with a pumice stone but was not able to.  In the last few days her toe has gotten more painful and discolored. She went to podiatry today who debrided the wound and said it was infected. They did an xray which showed osteomyelitic changes noted in the distal phalanx of right foot without soft tissue gas or emphysema or foreign body. He sent her to the ED to get IV antibiotics and possible surgery.  follow-up on.  In the ED she had WBC of 9.7 has remained hemodynamically stable and afebrile.  MRI was obtained and is pending.  She was given  1 dose of IV Zosyn.  Patient has not noticed sudden swelling in her legs.  She has not had any fevers or vomiting.   Review Of Systems: Per HPI with the following additions:   Review of Systems  Constitutional:  Negative for fever.  Eyes:  Negative for visual disturbance.  Respiratory:  Negative for chest tightness and shortness of breath.   Cardiovascular:  Negative for chest pain.  Gastrointestinal:  Negative for abdominal pain and constipation.  Genitourinary:  Negative for difficulty urinating.  Skin:  Positive for wound.  Neurological:  Positive for headaches.    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[place]) 08/04/2017   Diabetic neuropathy ([place]) 09/28/2012   CERVICAL POLYP 02/14/2010   Hyperlipidemia 03/06/2008   DM (diabetes mellitus), type 2, uncontrolled ([place]) 05/14/2006   OBESITY, NOS 05/14/2006   HYPERTENSION, BENIGN SYSTEMIC 05/14/2006    Past Medical History: Past Medical History:  Diagnosis Date   Diabetes mellitus    Hypertension    on HCTZ   Obesity    Wears glasses     Past Surgical History: Past Surgical History:  Procedure Laterality Date   CHOLECYSTECTOMY     IR RADIOLOGIST EVAL & MGMT  04/19/2019   SHOULDER ARTHROSCOPY WITH ROTATOR CUFF REPAIR AND SUBACROMIAL DECOMPRESSION Left 06/03/2013   Procedure: LEFT SHOULDER ARTHROSCOPY DEBRIDEMENT EXTENTSIVE/SUBACROMIAL DECOMPRESSION/PARTIAL ACROMIOPLASTY DISTAL CLAVICLE RESECTION WITH CORACOACROMIAL RELEASE/ ROTATOR CUFF REPAIR ;  Surgeon: Johnny Bridge, MD;  Location: [facility];  Service: Orthopedics;  Laterality: Left;  ANESTHESIA: GENERAL, PRE/POST OP SCALENE   TUBAL LIGATION      Social History: Social History   Tobacco Use   Smoking status:  Former    Packs/day: 0.25    Years: 7.00    Pack years: 1.75    Types: Cigarettes    Start date: 03/18/1991    Quit date: 05/03/1998    Years since quitting: 22.5   Smokeless tobacco: Never   Tobacco comments:    Works at [employer] does NOT smoke.   Vaping Use   Vaping Use: Never used  Substance Use Topics   Alcohol use: No    Comment: rare   Drug use: No   Additional social history:   Please also refer to relevant sections of EMR.  Family History: Family History  Problem Relation Age of Onset   Diabetes Mother    Heart disease Mother    Hypertension Mother    Hyperlipidemia Mother    Cancer Mother    Breast cancer Mother    Diabetes Father    Heart disease Father    Hypertension Father    Hyperlipidemia Father    Breast cancer Maternal Aunt     Allergies and Medications: Allergies  Allergen Reactions   Liraglutide Nausea And Vomiting    Projectile Vomiting   Shrimp [Shellfish Allergy] Shortness Of Breath   Ace Inhibitors Cough   Metformin And Related Diarrhea and Nausea And Vomiting   Rosuvastatin Other (See Comments)    Myalgia - Legs reported - resolved upon discontinuation.    No current facility-administered medications on file prior to  encounter.   Current Outpatient Medications on File Prior to Encounter  Medication Sig Dispense Refill   acetaminophen (TYLENOL) 500 MG tablet Take 1,000 mg by mouth every 6 (six) hours as needed for moderate pain or headache.     Alcohol Swabs (ALCOHOL PADS) 70 % PADS 1 each by Does not apply route as directed. 90 each 3   atorvastatin (LIPITOR) 40 MG tablet Take 1 tablet (40 mg total) by mouth daily. 90 tablet 3   Blood Glucose Monitoring Suppl (ONETOUCH VERIO) w/Device KIT 1 kit by Does not apply route daily. 1 kit 1   empagliflozin (JARDIANCE) 25 MG TABS tablet Take 1 tablet (25 mg total) by mouth daily. 90 tablet 3   gabapentin (NEURONTIN) 300 MG capsule Take 2 capsules by mouth twice daily (Patient taking  differently: Take 600 mg by mouth 2 (two) times daily.) 360 capsule 0   glucose blood (ONETOUCH VERIO) test strip Use 3 times daily 100 each 12   hydrochlorothiazide (HYDRODIURIL) 25 MG tablet Take 1 tablet (25 mg total) by mouth daily. 90 tablet 3   ibuprofen (ADVIL) 200 MG tablet Take 400 mg by mouth every 6 (six) hours as needed for headache or moderate pain.     Insulin Glargine (BASAGLAR KWIKPEN) 100 UNIT/ML Inject 50 Units into the skin daily. (Patient taking differently: Inject 50 Units into the skin every evening.) 15 mL 2   Insulin Pen Needle 31G X 5 MM MISC Use one needle per application 338 each 6   irbesartan (AVAPRO) 75 MG tablet Take 1 tablet (75 mg total) by mouth daily. 90 tablet 3   Lancets (ONETOUCH ULTRASOFT) lancets Please use to check blood sugar 3 times daily before meals. E11.65 100 each 12   Multiple Vitamins-Minerals (MULTIVITAMIN WITH MINERALS) tablet Take 1 tablet by mouth daily. Reported on 2/50/5397     [facility] DELICA LANCETS FINE MISC 1 each by Does not apply route 2 (two) times daily. 100 each 5   Turmeric (QC TUMERIC COMPLEX PO) Take 2 tablets by mouth daily. gummy     liraglutide (VICTOZA) 18 MG/3ML SOPN Inject 0.6 mg into the skin daily. 0.6 mg once daily for 1 week,then increase to 1.2 mg once daily,max 1.8  mg (Patient not taking: Reported on 11/16/2020) 6 mL 3   terbinafine (LAMISIL) 250 MG tablet Take 1 tablet (250 mg total) by mouth daily. (Patient not taking: Reported on 11/16/2020) 90 tablet 0   terbinafine (LAMISIL) 250 MG tablet Take 1 tablet by mouth once daily (Patient not taking: Reported on 11/16/2020) 30 tablet 0    Objective: BP (!) 148/81   Pulse 92   Temp 98.8 F (37.1 C)   Resp (!) 29   Ht '5\' 6"'  (1.676 m)   Wt 114.8 kg   SpO2 98%   BMI 40.84 kg/m  Exam: General: Tired, nontoxic, NAD, awake, alert and oriented x3, responsive to questions Head: Normocephalic atraumatic CV: Regular rate and rhythm no murmurs rubs or gallops Respiratory: Clear  to ausculation bilaterally, no wheezes rales or crackles, chest rises symmetrically,  no increased work of breathing Abdomen: Soft, non-tender to palpation, non-distended, normoactive bowel sounds. Attests mild abdominal discomfort from gas. Extremities: Moves upper and lower extremities freely. Left foot with great toe amputated site clean, no fluctuance or signs of infection.  Right foot with great toe ulceration at debridement site.  Mild soft tissue swelling on anterior surface of right great toe. Trace edema on right calf. no active drainage. Fungal changes  on toenails.  Feet warm and well-perfused sensation intact.  Neuro: No focal deficits, AxO x3 Skin: Few hyperpigmented spots on bilateral lower extremities     Labs and Imaging: CBC BMET  Recent Labs  Lab 11/16/20 2042  WBC 9.7  HGB 13.4  HCT 43.8  PLT 272   Recent Labs  Lab 11/16/20 2042  NA 138  K 4.2  CL 101  CO2 27  BUN 25*  CREATININE 1.03*  GLUCOSE 206*  CALCIUM 9.7     R Foot Xray osteomyelitic changes noted at the distal phalanx with no soft tissue gas or emphysema noted.  R foot MRI pending  Gerrit Heck 11/16/2020, 11:28 PM PGY-1, [place] Intern pager: [phone], text pages welcome   Upper Level Addendum:  I have seen and evaluated this patient along with Dr. Herb Grays and reviewed the above note, making necessary revisions as appropriate.  I agree with the medical decision making and physical exam as noted above.  Lurline Del, DO PGY-3 [facility]

## 2020-11-16 NOTE — ED Provider Notes (Signed)
Emergency Medicine Provider Triage Evaluation Note  CAROLEE CHANNELL , a 50 y.o. female  was evaluated in triage.  Pt complains of right foot infection. Pt developed infection in right big toe. Saw podiatrist today who sent her to ED for MRI and IV abx.   Review of Systems  Positive: Right toe infection Negative: Fever, chills, foot pain  Physical Exam  BP (!) 144/70 (BP Location: Left Arm)   Pulse 90   Temp 98.8 F (37.1 C)   Resp 17   SpO2 98%  Gen:   Awake, no distress   Resp:  Normal effort  MSK:   Moves extremities without difficulty  Other:    Medical Decision Making  Medically screening exam initiated at 8:33 PM.  Appropriate orders placed.  Karliah C Keizer was informed that the remainder of the evaluation will be completed by another provider, this initial triage assessment does not replace that evaluation, and the importance of remaining in the ED until their evaluation is complete.     Jeanella Flattery 11/16/20 2036    Pricilla Loveless, MD 11/19/20 2325

## 2020-11-16 NOTE — ED Provider Notes (Signed)
[facility] EMERGENCY DEPARTMENT Provider Note   CSN: [id number] Arrival date & time: 11/16/20  1916     History Chief Complaint  Patient presents with   Foot Pain    Virginia [name] is a 50 y.o. female.   Foot Pain   Patient has a history of diabetes, peripheral neuropathy and Charcot's joint of her left foot.  Patient noticed a dark callus on her toe for several days.  It progressively became more painful and discolored so she went to see her podiatrist.  Patient has not had any fevers or chills.  No vomiting or diarrhea.  She was seen by Dr. Posey Pronto today and he was concerned about osteomyelitis.  He sent the patient to the emergency room to be admitted to the hospital for IV antibiotics, MRI and possible surgical intervention by him.  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[place]) 08/04/2017   Diabetic neuropathy ([place]) 09/28/2012   CERVICAL POLYP 02/14/2010   Hyperlipidemia 03/06/2008   DM (diabetes mellitus), type 2, uncontrolled ([place]) 05/14/2006   OBESITY, NOS 05/14/2006   HYPERTENSION, BENIGN SYSTEMIC 05/14/2006    Past Surgical History:  Procedure Laterality Date   CHOLECYSTECTOMY     IR RADIOLOGIST EVAL & MGMT  04/19/2019   SHOULDER ARTHROSCOPY WITH ROTATOR CUFF REPAIR AND SUBACROMIAL DECOMPRESSION Left 06/03/2013   Procedure: LEFT SHOULDER ARTHROSCOPY DEBRIDEMENT EXTENTSIVE/SUBACROMIAL DECOMPRESSION/PARTIAL ACROMIOPLASTY DISTAL CLAVICLE RESECTION WITH CORACOACROMIAL RELEASE/ ROTATOR CUFF REPAIR ;  Surgeon: Johnny Bridge, MD;  Location: [facility];  Service: Orthopedics;   Laterality: Left;  ANESTHESIA: GENERAL, PRE/POST OP SCALENE   TUBAL LIGATION       OB History   No obstetric history on file.     Family History  Problem Relation Age of Onset   Diabetes Mother    Heart disease Mother    Hypertension Mother    Hyperlipidemia Mother    Cancer Mother    Breast cancer Mother    Diabetes Father    Heart disease Father    Hypertension Father    Hyperlipidemia Father    Breast cancer Maternal Aunt     Social History   Tobacco Use   Smoking status: Former    Packs/day: 0.25    Years: 7.00    Pack years: 1.75    Types: Cigarettes    Start date: 03/18/1991    Quit date: 05/03/1998    Years since quitting: 22.5   Smokeless tobacco: Never   Tobacco comments:    Works at [employer] does NOT smoke.   Vaping Use   Vaping Use: Never used  Substance Use Topics   Alcohol use: No    Comment: rare   Drug use: No    Home Medications Prior to Admission medications   Medication Sig Start Date End Date Taking? Authorizing Provider  acetaminophen (TYLENOL) 500 MG tablet Take 1,000 mg by mouth every 6 (six) hours as needed for moderate pain or headache.   Yes [provider]  Alcohol Swabs (ALCOHOL PADS) 70 % PADS 1 each by Does not apply route  as directed. 03/31/18  Yes Orson Eva J, DO  atorvastatin (LIPITOR) 40 MG tablet Take 1 tablet (40 mg total) by mouth daily. 04/30/20  Yes Maness, Philip, MD  Blood Glucose Monitoring Suppl (ONETOUCH VERIO) w/Device KIT 1 kit by Does not apply route daily. 08/22/16  Yes Carlyle Dolly, MD  empagliflozin (JARDIANCE) 25 MG TABS tablet Take 1 tablet (25 mg total) by mouth daily. 04/30/20  Yes Maness, Arnette Norris, MD  gabapentin (NEURONTIN) 300 MG capsule Take 2 capsules by mouth twice daily Patient taking differently: Take 600 mg by mouth 2 (two) times daily. 08/23/20  Yes Autry-Lott, Naaman Plummer, DO  glucose blood (ONETOUCH VERIO) test strip Use 3 times daily 05/31/19  Yes Autry-Lott, Naaman Plummer, DO   hydrochlorothiazide (HYDRODIURIL) 25 MG tablet Take 1 tablet (25 mg total) by mouth daily. 04/30/20  Yes Maness, Arnette Norris, MD  ibuprofen (ADVIL) 200 MG tablet Take 400 mg by mouth every 6 (six) hours as needed for headache or moderate pain.   Yes [provider]  Insulin Glargine (BASAGLAR KWIKPEN) 100 UNIT/ML Inject 50 Units into the skin daily. Patient taking differently: Inject 50 Units into the skin every evening. 10/01/20  Yes Autry-Lott, Naaman Plummer, DO  Insulin Pen Needle 31G X 5 MM MISC Use one needle per application 01/11/24  Yes Wilber Oliphant, MD  irbesartan (AVAPRO) 75 MG tablet Take 1 tablet (75 mg total) by mouth daily. 04/30/20  Yes Maness, Arnette Norris, MD  Lancets [facility] ULTRASOFT) lancets Please use to check blood sugar 3 times daily before meals. E11.65 06/08/19  Yes Autry-Lott, Naaman Plummer, DO  Multiple Vitamins-Minerals (MULTIVITAMIN WITH MINERALS) tablet Take 1 tablet by mouth daily. Reported on 07/09/2015   Yes [provider]  [place] 1 each by Does not apply route 2 (two) times daily. 12/21/17  Yes Bufford Lope, DO  Turmeric (QC TUMERIC COMPLEX PO) Take 2 tablets by mouth daily. gummy   Yes [provider]  liraglutide (VICTOZA) 18 MG/3ML SOPN Inject 0.6 mg into the skin daily. 0.6 mg once daily for 1 week,then increase to 1.2 mg once daily,max 1.8  mg Patient not taking: Reported on 11/16/2020 10/01/20   Autry-Lott, Naaman Plummer, DO  terbinafine (LAMISIL) 250 MG tablet Take 1 tablet (250 mg total) by mouth daily. Patient not taking: Reported on 11/16/2020 05/26/19   Felipa Furnace, DPM  terbinafine (LAMISIL) 250 MG tablet Take 1 tablet by mouth once daily Patient not taking: Reported on 11/16/2020 08/23/20   Felipa Furnace, DPM    Allergies    Liraglutide, Shrimp [shellfish allergy], Ace inhibitors, Metformin and related, and Rosuvastatin  Review of Systems   Review of Systems  All other systems reviewed and are negative.  Physical Exam Updated  Vital Signs BP (!) 148/81   Pulse 92   Temp 98.8 F (37.1 C)   Resp (!) 29   Ht 1.676 m (_0 )   Wt 114.8 kg   SpO2 98%   BMI 40.84 kg/m   Physical Exam Vitals and nursing note reviewed.  Constitutional:      General: She is not in acute distress.    Appearance: She is well-developed.  HENT:     Head: Normocephalic and atraumatic.     Right Ear: External ear normal.     Left Ear: External ear normal.  Eyes:     General: No scleral icterus.       Right eye: No discharge.        Left eye: No  discharge.     Conjunctiva/sclera: Conjunctivae normal.  Neck:     Trachea: No tracheal deviation.  Cardiovascular:     Rate and Rhythm: Normal rate and regular rhythm.  Pulmonary:     Effort: Pulmonary effort is normal. No respiratory distress.     Breath sounds: Normal breath sounds. No stridor. No wheezing or rales.  Abdominal:     General: Bowel sounds are normal. There is no distension.     Palpations: Abdomen is soft.     Tenderness: There is no abdominal tenderness. There is no guarding or rebound.  Musculoskeletal:        General: Tenderness present. No deformity.     Cervical back: Neck supple.     Comments: Debrided superficial ulcer of the right big toe, pink granulation tissue noted at the base of the wound, erythema and tenderness of the big toe and some edema noted in the midfoot, no lymphangitic streaking  Skin:    General: Skin is warm and dry.     Findings: No rash.  Neurological:     General: No focal deficit present.     Mental Status: She is alert.     Cranial Nerves: No cranial nerve deficit (no facial droop, extraocular movements intact, no slurred speech).     Sensory: No sensory deficit.     Motor: No abnormal muscle tone or seizure activity.     Coordination: Coordination normal.  Psychiatric:        Mood and Affect: Mood normal.    ED Results / Procedures / Treatments   Labs (all labs ordered are listed, but only abnormal results are  displayed) Labs Reviewed  CBC WITH DIFFERENTIAL/PLATELET - Abnormal; Notable for the following components:      Result Value   RBC 5.59 (*)    MCV 78.4 (*)    MCH 24.0 (*)    RDW 15.9 (*)    All other components within normal limits  COMPREHENSIVE METABOLIC PANEL - Abnormal; Notable for the following components:   Glucose, Bld 206 (*)    BUN 25 (*)    Creatinine, Ser 1.03 (*)    Alkaline Phosphatase 157 (*)    All other components within normal limits  LACTIC ACID, PLASMA  LACTIC ACID, PLASMA    EKG None  Radiology DG Foot Complete Right  Result Date: 11/16/2020 Please see detailed radiograph report in office note.   Procedures Procedures   Medications Ordered in ED Medications  piperacillin-tazobactam (ZOSYN) IVPB 3.375 g (0 g Intravenous Stopped 11/16/20 2319)    ED Course  I have reviewed the triage vital signs and the nursing notes.  Pertinent labs & imaging results that were available during my care of the patient were reviewed by me and considered in my medical decision making (see chart for details).  Clinical Course as of 11/16/20 2322  Fri Nov 16, 2020  2201 CBC is normal.  Metabolic panel is normal.  Lactic acid level was normal [JK]    Clinical Course User Index [JK] Dorie Rank, MD   MDM Rules/Calculators/A&P                           Patient presented to the ED for evaluation of toe cellulitis and possible osteomyelitis.  Patient was seen by the podiatrist as an outpatient.  There was concerns for osteomyelitis and he plans on possible amputation.  On exam the patient appears nontoxic.  She does have history  of diabetes and certainly is at risk for severe infection.  No signs of sepsis.  Patient does have evidence of skin ulceration.  MRI has been ordered.  Have ordered IV antibiotics.  I will consult the medical service for admission further treatment Final Clinical Impression(s) / ED Diagnoses Final diagnoses:  Cellulitis of toe of right foot      Dorie Rank, MD 11/16/20 2322

## 2020-11-16 NOTE — Progress Notes (Signed)
Subjective:  Patient ID: Virginia [name], female    DOB: 09-Apr-1970,  MRN: [id number]  Chief Complaint  Patient presents with   Toe Pain    Right great toe  Swollen and discolored     50 y.o. female presents with the above complaint.  Patient presents with right hallux great toe ulceration and abscess that has progressed to gotten worse.  Patient states that started over the last few days and had gotten more painful and discolored over the last few days.  Patient states is painful to walk on it she states that her A1c is at 10%.  She is a diabetic.  She would like to discuss treatment options.  She has not seen and was prior to seeing me.  She is concerned about infection.  She has a history of left partial hallux amputation.   Review of Systems: Negative except as noted in the HPI. Denies N/V/F/Ch.  Past Medical History:  Diagnosis Date   Diabetes mellitus    Hypertension    on HCTZ   Obesity    Wears glasses     Current Outpatient Medications:    Alcohol Swabs (ALCOHOL PADS) 70 % PADS, 1 each by Does not apply route as directed., Disp: 90 each, Rfl: 3   atorvastatin (LIPITOR) 40 MG tablet, Take 1 tablet (40 mg total) by mouth daily., Disp: 90 tablet, Rfl: 3   Blood Glucose Monitoring Suppl (ONETOUCH VERIO) w/Device KIT, 1 kit by Does not apply route daily., Disp: 1 kit, Rfl: 1   empagliflozin (JARDIANCE) 25 MG TABS tablet, Take 1 tablet (25 mg total) by mouth daily., Disp: 90 tablet, Rfl: 3   gabapentin (NEURONTIN) 300 MG capsule, Take 2 capsules by mouth twice daily, Disp: 360 capsule, Rfl: 0   glucose blood (ONETOUCH VERIO) test strip, Use 3 times daily, Disp: 100 each, Rfl: 12   hydrochlorothiazide (HYDRODIURIL) 25 MG tablet, Take 1 tablet (25 mg total) by mouth daily., Disp: 90 tablet, Rfl: 3   Insulin Glargine (BASAGLAR KWIKPEN) 100 UNIT/ML, Inject 50 Units into the skin daily., Disp: 15 mL, Rfl: 2   Insulin Pen Needle 31G X 5 MM MISC, Use one needle per application, Disp: 774  each, Rfl: 6   irbesartan (AVAPRO) 75 MG tablet, Take 1 tablet (75 mg total) by mouth daily., Disp: 90 tablet, Rfl: 3   Lancets (ONETOUCH ULTRASOFT) lancets, Please use to check blood sugar 3 times daily before meals. E11.65, Disp: 100 each, Rfl: 12   liraglutide (VICTOZA) 18 MG/3ML SOPN, Inject 0.6 mg into the skin daily. 0.6 mg once daily for 1 week,then increase to 1.2 mg once daily,max 1.8  mg, Disp: 6 mL, Rfl: 3   Multiple Vitamins-Minerals (MULTIVITAMIN WITH MINERALS) tablet, Take 1 tablet by mouth daily. Reported on 07/09/2015, Disp: , Rfl:    ONETOUCH DELICA LANCETS FINE MISC, 1 each by Does not apply route 2 (two) times daily., Disp: 100 each, Rfl: 5   terbinafine (LAMISIL) 250 MG tablet, Take 1 tablet (250 mg total) by mouth daily., Disp: 90 tablet, Rfl: 0   terbinafine (LAMISIL) 250 MG tablet, Take 1 tablet by mouth once daily, Disp: 30 tablet, Rfl: 0  Social History   Tobacco Use  Smoking Status Former   Packs/day: 0.25   Years: 7.00   Pack years: 1.75   Types: Cigarettes   Start date: 03/18/1991   Quit date: 05/03/1998   Years since quitting: 22.5  Smokeless Tobacco Never  Tobacco Comments   Works at  [employer] - AND does NOT smoke.     Allergies  Allergen Reactions   Liraglutide Nausea And Vomiting    Projectile Vomiting   Shrimp [Shellfish Allergy] Shortness Of Breath   Ace Inhibitors Cough   Metformin And Related Diarrhea and Nausea And Vomiting   Rosuvastatin Other (See Comments)    Myalgia - Legs reported - resolved upon discontinuation.    Objective:  There were no vitals filed for this visit. There is no height or weight on file to calculate BMI. Constitutional Well developed. Well nourished.  Vascular Dorsalis pedis pulses palpable bilaterally. Posterior tibial pulses palpable bilaterally. Capillary refill normal to all digits.  No cyanosis or clubbing noted. Pedal hair growth normal.  Neurologic Normal speech. Oriented to person, place, and  time. Epicritic sensation to light touch grossly present bilaterally.  Dermatologic Nails well groomed and normal in appearance. No open wounds. No skin lesions.  Orthopedic: Right hallux wound noted probing down to bone with concern for osteomyelitis.  Superficial purulent drainage noted.  Malodor present.  Probing down to bone.   Radiographs: 3 views of skeletally mature the right foot: Osteomyelitic changes noted at the distal phalanx.  No soft tissue gas or emphysema noted.  No foreign body noted. Assessment:   1. 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 Toe osteomyelitis, right ([place])   3. Abscess of great toe of right foot   4. Listed for admission to hospital   5. Uncontrolled type 2 diabetes mellitus with hyperglycemia ([place])    Plan:  Patient was evaluated and treated and all questions answered.  Right hallux osteomyelitis -I explained to the patient the etiology of osteomyelitis and various treatment options were extensively discussed.  Given the nature of acute infection setting as well as the presence of bone infection she will benefit from IV antibiotics. -I did instruct her to go to the emergency room to be admitted to the hospital for IV antibiotics and an MRI to assess the level of osteomyelitis. -She has adequate flow to the right lower extremity with palpable pulses therefore will defer ABIs PVRs for now. -Patient will need surgical amputation of the right hallux pending the MRI.  I will plan on possibly taking her to the OR on Monday. -Local wound care with Betadine wet-to-dry -Weightbearing as tolerated with surgical shoe -Patient is a high risk of toe amputation versus foot amputation versus leg.  I discussed this with the patient she states understanding.  Given that her A1c is very uncontrolled it places her at a high risk for nonhealing of the wound.  I discussed with her she states understanding will work on glucose control.  No  follow-ups on file.

## 2020-11-17 ENCOUNTER — Observation Stay (HOSPITAL_COMMUNITY): Payer: 59

## 2020-11-17 DIAGNOSIS — E1142 Type 2 diabetes mellitus with diabetic polyneuropathy: Secondary | ICD-10-CM | POA: Diagnosis present

## 2020-11-17 DIAGNOSIS — E785 Hyperlipidemia, unspecified: Secondary | ICD-10-CM | POA: Diagnosis present

## 2020-11-17 DIAGNOSIS — Z794 Long term (current) use of insulin: Secondary | ICD-10-CM | POA: Diagnosis not present

## 2020-11-17 DIAGNOSIS — Z888 Allergy status to other drugs, medicaments and biological substances status: Secondary | ICD-10-CM | POA: Diagnosis not present

## 2020-11-17 DIAGNOSIS — Z803 Family history of malignant neoplasm of breast: Secondary | ICD-10-CM | POA: Diagnosis not present

## 2020-11-17 DIAGNOSIS — M86671 Other chronic osteomyelitis, right ankle and foot: Secondary | ICD-10-CM | POA: Diagnosis not present

## 2020-11-17 DIAGNOSIS — Z91013 Allergy to seafood: Secondary | ICD-10-CM | POA: Diagnosis not present

## 2020-11-17 DIAGNOSIS — E1169 Type 2 diabetes mellitus with other specified complication: Secondary | ICD-10-CM | POA: Diagnosis present

## 2020-11-17 DIAGNOSIS — M869 Osteomyelitis, unspecified: Secondary | ICD-10-CM | POA: Diagnosis present

## 2020-11-17 DIAGNOSIS — Z79899 Other long term (current) drug therapy: Secondary | ICD-10-CM | POA: Diagnosis not present

## 2020-11-17 DIAGNOSIS — Z83438 Family history of other disorder of lipoprotein metabolism and other lipidemia: Secondary | ICD-10-CM | POA: Diagnosis not present

## 2020-11-17 DIAGNOSIS — Z6841 Body Mass Index (BMI) 40.0 and over, adult: Secondary | ICD-10-CM | POA: Diagnosis not present

## 2020-11-17 DIAGNOSIS — Z833 Family history of diabetes mellitus: Secondary | ICD-10-CM | POA: Diagnosis not present

## 2020-11-17 DIAGNOSIS — Z20822 Contact with and (suspected) exposure to covid-19: Secondary | ICD-10-CM | POA: Diagnosis present

## 2020-11-17 DIAGNOSIS — L97519 Non-pressure chronic ulcer of other part of right foot with unspecified severity: Secondary | ICD-10-CM | POA: Diagnosis present

## 2020-11-17 DIAGNOSIS — I10 Essential (primary) hypertension: Secondary | ICD-10-CM | POA: Diagnosis present

## 2020-11-17 DIAGNOSIS — Z8249 Family history of ischemic heart disease and other diseases of the circulatory system: Secondary | ICD-10-CM | POA: Diagnosis not present

## 2020-11-17 DIAGNOSIS — R748 Abnormal levels of other serum enzymes: Secondary | ICD-10-CM | POA: Diagnosis present

## 2020-11-17 DIAGNOSIS — L0390 Cellulitis, unspecified: Secondary | ICD-10-CM | POA: Diagnosis present

## 2020-11-17 DIAGNOSIS — E11621 Type 2 diabetes mellitus with foot ulcer: Secondary | ICD-10-CM | POA: Diagnosis present

## 2020-11-17 DIAGNOSIS — Z89431 Acquired absence of right foot: Secondary | ICD-10-CM | POA: Diagnosis not present

## 2020-11-17 DIAGNOSIS — Z87891 Personal history of nicotine dependence: Secondary | ICD-10-CM | POA: Diagnosis not present

## 2020-11-17 DIAGNOSIS — Z7984 Long term (current) use of oral hypoglycemic drugs: Secondary | ICD-10-CM | POA: Diagnosis not present

## 2020-11-17 DIAGNOSIS — E11610 Type 2 diabetes mellitus with diabetic neuropathic arthropathy: Secondary | ICD-10-CM | POA: Diagnosis present

## 2020-11-17 DIAGNOSIS — E1165 Type 2 diabetes mellitus with hyperglycemia: Secondary | ICD-10-CM | POA: Diagnosis not present

## 2020-11-17 DIAGNOSIS — L03031 Cellulitis of right toe: Secondary | ICD-10-CM | POA: Diagnosis present

## 2020-11-17 LAB — GLUCOSE, CAPILLARY
Glucose-Capillary: 232 mg/dL — ABNORMAL HIGH (ref 70–99)
Glucose-Capillary: 117 mg/dL — ABNORMAL HIGH (ref 70–99)

## 2020-11-17 LAB — CBC
HCT: 41.9 % (ref 36.0–46.0)
Hemoglobin: 12.9 g/dL (ref 12.0–15.0)
MCH: 24.4 pg — ABNORMAL LOW (ref 26.0–34.0)
MCHC: 30.8 g/dL (ref 30.0–36.0)
MCV: 79.2 fL — ABNORMAL LOW (ref 80.0–100.0)
Platelets: 223 10*3/uL (ref 150–400)
RBC: 5.29 MIL/uL — ABNORMAL HIGH (ref 3.87–5.11)
RDW: 15.9 % — ABNORMAL HIGH (ref 11.5–15.5)
WBC: 7.8 10*3/uL (ref 4.0–10.5)
nRBC: 0 % (ref 0.0–0.2)

## 2020-11-17 LAB — COMPREHENSIVE METABOLIC PANEL
ALT: 15 U/L (ref 0–44)
AST: 14 U/L — ABNORMAL LOW (ref 15–41)
Albumin: 3.6 g/dL (ref 3.5–5.0)
Alkaline Phosphatase: 141 U/L — ABNORMAL HIGH (ref 38–126)
Anion gap: 10 (ref 5–15)
BUN: 28 mg/dL — ABNORMAL HIGH (ref 6–20)
CO2: 23 mmol/L (ref 22–32)
Calcium: 9.1 mg/dL (ref 8.9–10.3)
Chloride: 105 mmol/L (ref 98–111)
Creatinine, Ser: 1 mg/dL (ref 0.44–1.00)
GFR, Estimated: >60 mL/min (ref >60)
Glucose, Bld: 207 mg/dL — ABNORMAL HIGH (ref 70–99)
Potassium: 4.2 mmol/L (ref 3.5–5.1)
Sodium: 138 mmol/L (ref 135–145)
Total Bilirubin: 0.8 mg/dL (ref 0.3–1.2)
Total Protein: 7.4 g/dL (ref 6.5–8.1)

## 2020-11-17 LAB — HEMOGLOBIN A1C
Hgb A1c MFr Bld: 10.7 % — ABNORMAL HIGH (ref 4.8–5.6)
Mean Plasma Glucose: 260.39 mg/dL

## 2020-11-17 LAB — CBG MONITORING, ED
Glucose-Capillary: 164 mg/dL — ABNORMAL HIGH (ref 70–99)
Glucose-Capillary: 200 mg/dL — ABNORMAL HIGH (ref 70–99)

## 2020-11-17 LAB — SARS CORONAVIRUS 2 (TAT 6-24 HRS): SARS Coronavirus 2: NEGATIVE

## 2020-11-17 LAB — HIV ANTIBODY (ROUTINE TESTING W REFLEX): HIV Screen 4th Generation wRfx: NONREACTIVE

## 2020-11-17 MED ORDER — INSULIN GLARGINE-YFGN 100 UNIT/ML ~~LOC~~ SOLN
25.0000 [IU] | Freq: Every day | SUBCUTANEOUS | Status: DC
  Administered 2020-11-17: 25 [IU] via SUBCUTANEOUS
  Filled 2020-11-17 (×2): qty 0.25

## 2020-11-17 MED ORDER — VANCOMYCIN HCL 2000 MG/400ML IV SOLN
2000.0000 mg | Freq: Once | INTRAVENOUS | Status: AC
  Administered 2020-11-17: 2000 mg via INTRAVENOUS
  Filled 2020-11-17: qty 400

## 2020-11-17 MED ORDER — EMPAGLIFLOZIN 25 MG PO TABS
25.0000 mg | ORAL_TABLET | Freq: Every day | ORAL | Status: DC
  Administered 2020-11-17 – 2020-11-19 (×2): 25 mg via ORAL
  Filled 2020-11-17 (×3): qty 1

## 2020-11-17 MED ORDER — GABAPENTIN 300 MG PO CAPS
600.0000 mg | ORAL_CAPSULE | Freq: Two times a day (BID) | ORAL | Status: DC
  Administered 2020-11-17 – 2020-11-19 (×5): 600 mg via ORAL
  Filled 2020-11-17 (×5): qty 2

## 2020-11-17 MED ORDER — VANCOMYCIN HCL 1500 MG/300ML IV SOLN
1500.0000 mg | INTRAVENOUS | Status: DC
  Administered 2020-11-18 – 2020-11-19 (×2): 1500 mg via INTRAVENOUS
  Filled 2020-11-17 (×2): qty 300

## 2020-11-17 MED ORDER — IRBESARTAN 75 MG PO TABS
75.0000 mg | ORAL_TABLET | Freq: Every day | ORAL | Status: DC
  Administered 2020-11-17 – 2020-11-19 (×2): 75 mg via ORAL
  Filled 2020-11-17 (×2): qty 1

## 2020-11-17 MED ORDER — SODIUM CHLORIDE 0.9 % IV SOLN
2.0000 g | Freq: Three times a day (TID) | INTRAVENOUS | Status: DC
  Administered 2020-11-17 – 2020-11-19 (×7): 2 g via INTRAVENOUS
  Filled 2020-11-17 (×8): qty 2

## 2020-11-17 MED ORDER — INSULIN ASPART 100 UNIT/ML IJ SOLN
0.0000 [IU] | Freq: Three times a day (TID) | INTRAMUSCULAR | Status: DC
  Administered 2020-11-17: 3 [IU] via SUBCUTANEOUS
  Administered 2020-11-17: 5 [IU] via SUBCUTANEOUS
  Administered 2020-11-17: 3 [IU] via SUBCUTANEOUS
  Administered 2020-11-18: 8 [IU] via SUBCUTANEOUS
  Administered 2020-11-19: 2 [IU] via SUBCUTANEOUS

## 2020-11-17 MED ORDER — METRONIDAZOLE 500 MG PO TABS
500.0000 mg | ORAL_TABLET | Freq: Four times a day (QID) | ORAL | Status: DC
  Administered 2020-11-17 – 2020-11-19 (×9): 500 mg via ORAL
  Filled 2020-11-17 (×9): qty 1

## 2020-11-17 MED ORDER — ATORVASTATIN CALCIUM 40 MG PO TABS
40.0000 mg | ORAL_TABLET | Freq: Every day | ORAL | Status: DC
  Administered 2020-11-17 – 2020-11-19 (×2): 40 mg via ORAL
  Filled 2020-11-17 (×2): qty 1

## 2020-11-17 MED ORDER — HEPARIN SODIUM (PORCINE) 5000 UNIT/ML IJ SOLN
5000.0000 [IU] | Freq: Three times a day (TID) | INTRAMUSCULAR | Status: DC
  Administered 2020-11-17 – 2020-11-19 (×6): 5000 [IU] via SUBCUTANEOUS
  Filled 2020-11-17 (×6): qty 1

## 2020-11-17 MED ORDER — HYDROCHLOROTHIAZIDE 25 MG PO TABS
25.0000 mg | ORAL_TABLET | Freq: Every day | ORAL | Status: DC
  Administered 2020-11-17 – 2020-11-19 (×2): 25 mg via ORAL
  Filled 2020-11-17 (×2): qty 1

## 2020-11-17 NOTE — ED Notes (Signed)
Breakfast Ordered 

## 2020-11-17 NOTE — Progress Notes (Signed)
Family Medicine Teaching Service Daily Progress Note Intern Pager: [phone]  Patient name: Virginia [name] Medical record number: [id number] Date of birth: 08/13/1970 Age: 50 y.o. Gender: female  Primary Care Provider: Lavonda Jumbo, DO Consultants: Podiatry Code Status: Full  Pt Overview and Major Events to Date:  9/2- admitted  Assessment and Plan: Patient is a 50 year old female presenting with right great toe infection likely to be osteomyelitis with past medical history significant for Charcot joints type 2 diabetes myelitis that is uncontrolled, hyperlipidemia, hypertension, BPPV.  Osteomyelitis of the right great toe Right foot plan stable from yesterday.  Soft tissue swelling similar to admission with no erythema, demarcation, or spreading.  Has remained afebrile. Pain is well controlled. -MRI of right foot pending -Continue IV vancomycin, IV cefepime, oral metronidazole 500 mg q6H -No sign of cellulitic changes, no area to mark this morning on skin.  Reassess during patient's hospital stay -CBC today showed WBC of 7.8 -CMP today showed kidney function 1.00 -Podiatry following, appreciate recommendations -PT/OT to evaluate and treat after surgery -Continue heart healthy/carb modified diet until need for n.p.o. diet for Monday morning surgery -Heparin DVT prophylaxis, unknown surgery time on Monday  Type 2 diabetes mellitus, uncontrolled -Continue Jardiance 25 mg daily, 25 units glargine basal, sliding scale insulin -Repeat A1c 10.7 -CBGs 206 last night -Creatinine was 1.00  Elevated alkaline phosphatase, likely secondary to osteomyelitis -Today was 141 from 157 on admission  Charcot joint left foot -Stable  Hyperlipidemia -Continue home atorvastatin 40 mg daily  Hypertension -Continue home medications irbesartan and hydrochlorothiazide -Blood pressure ranges have been 111-157/67-92  BPPV history -Stable, resolved in 2020  FEN/GI: Heart healthy carb modified  diet PPx: Heparin Dispo:Home in 2-3 days. Barriers include need for surgery and IV antibiotics.   Subjective:  Patient today was resting in bed. No complaints or changes from yesterday night on admission.  Podiatry to see sometime this weekend.  Waiting for surgery, continuing IV antibiotics.  Objective: Temp:  [98.8 F (37.1 C)] 98.8 F (37.1 C) (09/02 1921) Pulse Rate:  [84-96] 84 (09/03 0254) Resp:  [14-29] 14 (09/03 0254) BP: (111-157)/(67-92) 111/67 (09/03 0254) SpO2:  [94 %-100 %] 94 % (09/03 0254) Weight:  [114.8 kg] 114.8 kg (09/02 2143) Physical Exam: General: Well appearing, NAD, awake, alert, responsive to questions Head: Normocephalic atraumatic CV: Regular rate and rhythm  Respiratory: No increased work of breathing Abdomen: Soft, non-tender, non-distended, normoactive bowel sounds  Extremities: Moves upper and lower extremities freely, edema in right anterior shin decreased from yesterday. Right toe stable from yesterday, no demarcations of swelling, no erythema of skin, mild soft tissue swelling around right great toe. Well perfused. No active drainage. Neuro: No focal deficits Skin: Hyperpigmented spots on lower extremity  Laboratory: Recent Labs  Lab 11/16/20 2042  WBC 9.7  HGB 13.4  HCT 43.8  PLT 272   Recent Labs  Lab 11/16/20 2042  NA 138  K 4.2  CL 101  CO2 27  BUN 25*  CREATININE 1.03*  CALCIUM 9.7  PROT 8.0  BILITOT 0.7  ALKPHOS 157*  ALT 17  AST 16  GLUCOSE 206*    A1c 10.7 Creatinine 1.00  Imaging/Diagnostic Tests: MRI pending read  Levin Erp, MD 11/17/2020, 3:02 AM PGY-1, [facility] FPTS Intern pager: [phone], text pages welcome

## 2020-11-17 NOTE — ED Notes (Signed)
Walked patient to the bathroom patient did well 

## 2020-11-17 NOTE — ED Notes (Signed)
Pt ambulated self to bathroom.  

## 2020-11-17 NOTE — ED Notes (Signed)
Patient transported to MRI 

## 2020-11-17 NOTE — Progress Notes (Signed)
Pharmacy Antibiotic Note  Virginia [name] is a 50 y.o. female admitted on 11/16/2020 with diabetic foot infection concerning for  osteomyelitis vs cellulitis .  Pharmacy has been consulted for vancomycin and cefepime dosing.  Plan: Vancomycin 2000mg  x1 then 1500mg  IV Q24H. Goal AUC 400-550. Expected AUC 480.  SCr 1.03.  Cefepime 2g IV Q8H.  Height: 5\' 6"  (167.6 cm) Weight: 114.8 kg (253 lb) IBW/kg (Calculated) : 59.3  Temp (24hrs), Avg:98.8 F (37.1 C), Min:98.8 F (37.1 C), Max:98.8 F (37.1 C)  Recent Labs  Lab 11/16/20 2042 11/16/20 2043 11/16/20 2222  WBC 9.7  --   --   CREATININE 1.03*  --   --   LATICACIDVEN  --  1.2 1.0    Estimated Creatinine Clearance: 83.1 mL/min (A) (by C-G formula based on SCr of 1.03 mg/dL (H)).    Allergies  Allergen Reactions   Liraglutide Nausea And Vomiting    Projectile Vomiting   Shrimp [Shellfish Allergy] Shortness Of Breath   Ace Inhibitors Cough   Metformin And Related Diarrhea and Nausea And Vomiting   Rosuvastatin Other (See Comments)    Myalgia - Legs reported - resolved upon discontinuation.     Thank you for allowing pharmacy to be a part of this patient's care.  01/16/21, PharmD, BCPS  11/17/2020 12:50 AM

## 2020-11-18 ENCOUNTER — Inpatient Hospital Stay (HOSPITAL_COMMUNITY): Payer: 59 | Admitting: Certified Registered Nurse Anesthetist

## 2020-11-18 ENCOUNTER — Encounter (HOSPITAL_COMMUNITY)
Admission: EM | Disposition: A | Discharge status: Home / Self Care | Payer: Self-pay | Source: Home / Self Care | Attending: Family Medicine

## 2020-11-18 ENCOUNTER — Inpatient Hospital Stay (HOSPITAL_COMMUNITY): Payer: 59

## 2020-11-18 ENCOUNTER — Encounter (HOSPITAL_COMMUNITY): Payer: Self-pay | Admitting: Family Medicine

## 2020-11-18 DIAGNOSIS — M869 Osteomyelitis, unspecified: Secondary | ICD-10-CM

## 2020-11-18 DIAGNOSIS — M86671 Other chronic osteomyelitis, right ankle and foot: Secondary | ICD-10-CM

## 2020-11-18 HISTORY — PX: AMPUTATION: SHX166

## 2020-11-18 LAB — URINALYSIS, ROUTINE W REFLEX MICROSCOPIC
Bilirubin Urine: NEGATIVE
Glucose, UA: >=500 mg/dL — AB
Hgb urine dipstick: NEGATIVE
Ketones, ur: 5 mg/dL — AB
Nitrite: NEGATIVE
Protein, ur: NEGATIVE mg/dL
Specific Gravity, Urine: 1.027 (ref 1.005–1.030)
pH: 5 (ref 5.0–8.0)

## 2020-11-18 LAB — GLUCOSE, CAPILLARY
Glucose-Capillary: 117 mg/dL — ABNORMAL HIGH (ref 70–99)
Glucose-Capillary: 102 mg/dL — ABNORMAL HIGH (ref 70–99)
Glucose-Capillary: 100 mg/dL — ABNORMAL HIGH (ref 70–99)
Glucose-Capillary: 264 mg/dL — ABNORMAL HIGH (ref 70–99)
Glucose-Capillary: 258 mg/dL — ABNORMAL HIGH (ref 70–99)

## 2020-11-18 LAB — CBC
HCT: 38.6 % (ref 36.0–46.0)
Hemoglobin: 12.1 g/dL (ref 12.0–15.0)
MCH: 24.2 pg — ABNORMAL LOW (ref 26.0–34.0)
MCHC: 31.3 g/dL (ref 30.0–36.0)
MCV: 77.4 fL — ABNORMAL LOW (ref 80.0–100.0)
Platelets: 239 10*3/uL (ref 150–400)
RBC: 4.99 MIL/uL (ref 3.87–5.11)
RDW: 15.9 % — ABNORMAL HIGH (ref 11.5–15.5)
WBC: 7.1 10*3/uL (ref 4.0–10.5)
nRBC: 0 % (ref 0.0–0.2)

## 2020-11-18 LAB — COMPREHENSIVE METABOLIC PANEL
ALT: 13 U/L (ref 0–44)
AST: 18 U/L (ref 15–41)
Albumin: 3.1 g/dL — ABNORMAL LOW (ref 3.5–5.0)
Alkaline Phosphatase: 129 U/L — ABNORMAL HIGH (ref 38–126)
Anion gap: 8 (ref 5–15)
BUN: 26 mg/dL — ABNORMAL HIGH (ref 6–20)
CO2: 24 mmol/L (ref 22–32)
Calcium: 8.8 mg/dL — ABNORMAL LOW (ref 8.9–10.3)
Chloride: 103 mmol/L (ref 98–111)
Creatinine, Ser: 0.9 mg/dL (ref 0.44–1.00)
GFR, Estimated: >60 mL/min (ref >60)
Glucose, Bld: 157 mg/dL — ABNORMAL HIGH (ref 70–99)
Potassium: 3.9 mmol/L (ref 3.5–5.1)
Sodium: 135 mmol/L (ref 135–145)
Total Bilirubin: 0.6 mg/dL (ref 0.3–1.2)
Total Protein: 6.7 g/dL (ref 6.5–8.1)

## 2020-11-18 SURGERY — AMPUTATION DIGIT
Anesthesia: General | Laterality: Right

## 2020-11-18 MED ORDER — OXYCODONE HCL 5 MG/5ML PO SOLN
5.0000 mg | Freq: Once | ORAL | Status: DC | PRN
Start: 2020-11-18 — End: 2020-11-18

## 2020-11-18 MED ORDER — OXYCODONE HCL 5 MG PO TABS
5.0000 mg | ORAL_TABLET | Freq: Once | ORAL | Status: DC | PRN
Start: 2020-11-18 — End: 2020-11-18

## 2020-11-18 MED ORDER — FENTANYL CITRATE (PF) 100 MCG/2ML IJ SOLN: 25.0000 ug | INTRAMUSCULAR | Status: DC | PRN

## 2020-11-18 MED ORDER — LIDOCAINE-EPINEPHRINE 1 %-1:100000 IJ SOLN
INTRAMUSCULAR | Status: AC
  Filled 2020-11-18: qty 1

## 2020-11-18 MED ORDER — LACTATED RINGERS IV SOLN: INTRAVENOUS | Status: DC

## 2020-11-18 MED ORDER — ONDANSETRON HCL 4 MG/2ML IJ SOLN
INTRAMUSCULAR | Status: DC | PRN
  Administered 2020-11-18: 4 mg via INTRAVENOUS

## 2020-11-18 MED ORDER — LIDOCAINE 2% (20 MG/ML) 5 ML SYRINGE
INTRAMUSCULAR | Status: DC | PRN
  Administered 2020-11-18: 60 mg via INTRAVENOUS

## 2020-11-18 MED ORDER — ONDANSETRON HCL 4 MG/2ML IJ SOLN
4.0000 mg | Freq: Four times a day (QID) | INTRAMUSCULAR | Status: AC | PRN
Start: 2020-11-18 — End: 2020-11-18
  Administered 2020-11-18: 4 mg via INTRAVENOUS

## 2020-11-18 MED ORDER — ONDANSETRON HCL 4 MG/2ML IJ SOLN
INTRAMUSCULAR | Status: AC
  Filled 2020-11-18: qty 2

## 2020-11-18 MED ORDER — MIDAZOLAM HCL 2 MG/2ML IJ SOLN
INTRAMUSCULAR | Status: DC | PRN
  Administered 2020-11-18: 2 mg via INTRAVENOUS

## 2020-11-18 MED ORDER — LIDOCAINE 2% (20 MG/ML) 5 ML SYRINGE
INTRAMUSCULAR | Status: AC
  Filled 2020-11-18: qty 5

## 2020-11-18 MED ORDER — ALBUMIN HUMAN 5 % IV SOLN: INTRAVENOUS | Status: DC | PRN

## 2020-11-18 MED ORDER — MIDAZOLAM HCL 2 MG/2ML IJ SOLN
INTRAMUSCULAR | Status: AC
  Filled 2020-11-18: qty 2

## 2020-11-18 MED ORDER — ONDANSETRON HCL 4 MG/2ML IJ SOLN
INTRAMUSCULAR | Status: AC
  Administered 2020-11-18: 4 mg
  Filled 2020-11-18: qty 2

## 2020-11-18 MED ORDER — OXYCODONE HCL 5 MG PO TABS
5.0000 mg | ORAL_TABLET | Freq: Four times a day (QID) | ORAL | Status: DC | PRN
Start: 2020-11-18 — End: 2020-11-19
  Administered 2020-11-18: 5 mg via ORAL
  Filled 2020-11-18 (×2): qty 1

## 2020-11-18 MED ORDER — CHLORHEXIDINE GLUCONATE 0.12 % MT SOLN
OROMUCOSAL | Status: AC
  Administered 2020-11-18: 15 mL via OROMUCOSAL
  Filled 2020-11-18: qty 15

## 2020-11-18 MED ORDER — PROPOFOL 10 MG/ML IV BOLUS
INTRAVENOUS | Status: AC
  Filled 2020-11-18: qty 20

## 2020-11-18 MED ORDER — ONDANSETRON HCL 4 MG/2ML IJ SOLN
4.0000 mg | Freq: Once | INTRAMUSCULAR | Status: AC
  Administered 2020-11-18: 4 mg via INTRAVENOUS
  Filled 2020-11-18: qty 2

## 2020-11-18 MED ORDER — PHENYLEPHRINE 40 MCG/ML (10ML) SYRINGE FOR IV PUSH (FOR BLOOD PRESSURE SUPPORT)
PREFILLED_SYRINGE | INTRAVENOUS | Status: DC | PRN
  Administered 2020-11-18 (×2): 120 ug via INTRAVENOUS
  Administered 2020-11-18: 80 ug via INTRAVENOUS

## 2020-11-18 MED ORDER — ACETAMINOPHEN 500 MG PO TABS
1000.0000 mg | ORAL_TABLET | Freq: Four times a day (QID) | ORAL | Status: DC | PRN
  Administered 2020-11-18 – 2020-11-19 (×2): 1000 mg via ORAL
  Filled 2020-11-18 (×2): qty 2

## 2020-11-18 MED ORDER — LIDOCAINE HCL (PF) 1 % IJ SOLN
INTRAMUSCULAR | Status: AC
  Filled 2020-11-18: qty 30

## 2020-11-18 MED ORDER — INSULIN GLARGINE-YFGN 100 UNIT/ML ~~LOC~~ SOLN
25.0000 [IU] | Freq: Every day | SUBCUTANEOUS | Status: DC
  Administered 2020-11-19: 25 [IU] via SUBCUTANEOUS
  Filled 2020-11-18: qty 0.25

## 2020-11-18 MED ORDER — DOXYCYCLINE HYCLATE 100 MG PO TABS
100.0000 mg | ORAL_TABLET | Freq: Two times a day (BID) | ORAL | Status: DC
  Administered 2020-11-18 – 2020-11-19 (×2): 100 mg via ORAL
  Filled 2020-11-18 (×2): qty 1

## 2020-11-18 MED ORDER — PROPOFOL 10 MG/ML IV BOLUS
INTRAVENOUS | Status: DC | PRN
  Administered 2020-11-18: 200 mg via INTRAVENOUS

## 2020-11-18 MED ORDER — FENTANYL CITRATE (PF) 250 MCG/5ML IJ SOLN
INTRAMUSCULAR | Status: AC
  Filled 2020-11-18: qty 5

## 2020-11-18 MED ORDER — PHENYLEPHRINE 40 MCG/ML (10ML) SYRINGE FOR IV PUSH (FOR BLOOD PRESSURE SUPPORT)
PREFILLED_SYRINGE | INTRAVENOUS | Status: AC
  Filled 2020-11-18: qty 10

## 2020-11-18 MED ORDER — BUPIVACAINE HCL 0.5 % IJ SOLN
INTRAMUSCULAR | Status: DC | PRN
  Administered 2020-11-18: 20 mL

## 2020-11-18 MED ORDER — ORAL CARE MOUTH RINSE: 15.0000 mL | Freq: Once | OROMUCOSAL | Status: AC

## 2020-11-18 MED ORDER — BUPIVACAINE HCL 0.5 % IJ SOLN
INTRAMUSCULAR | Status: AC
  Filled 2020-11-18: qty 1

## 2020-11-18 MED ORDER — CHLORHEXIDINE GLUCONATE 0.12 % MT SOLN: 15.0000 mL | Freq: Once | OROMUCOSAL | Status: AC

## 2020-11-18 MED ORDER — 0.9 % SODIUM CHLORIDE (POUR BTL) OPTIME
TOPICAL | Status: DC | PRN
  Administered 2020-11-18: 1000 mL

## 2020-11-18 MED ORDER — FENTANYL CITRATE (PF) 250 MCG/5ML IJ SOLN
INTRAMUSCULAR | Status: DC | PRN
  Administered 2020-11-18 (×2): 50 ug via INTRAVENOUS

## 2020-11-18 SURGICAL SUPPLY — 33 items
BAG COUNTER SPONGE SURGICOUNT (BAG) IMPLANT
BAG SPNG CNTER NS LX DISP (BAG)
BLADE OSCIL/SAGITTAL W/10 ST (BLADE) ×1 IMPLANT
BNDG CMPR 9X4 STRL LF SNTH (GAUZE/BANDAGES/DRESSINGS)
BNDG COHESIVE 4X5 TAN STRL (GAUZE/BANDAGES/DRESSINGS) ×2 IMPLANT
BNDG ELASTIC 4X5.8 VLCR STR LF (GAUZE/BANDAGES/DRESSINGS) ×2 IMPLANT
BNDG ESMARK 4X9 LF (GAUZE/BANDAGES/DRESSINGS) IMPLANT
BNDG GAUZE ELAST 4 BULKY (GAUZE/BANDAGES/DRESSINGS) ×2 IMPLANT
COVER SURGICAL LIGHT HANDLE (MISCELLANEOUS) ×4 IMPLANT
CUFF TOURN SGL QUICK 18X4 (TOURNIQUET CUFF) ×2 IMPLANT
DRSG PAD ABDOMINAL 8X10 ST (GAUZE/BANDAGES/DRESSINGS) ×2 IMPLANT
ELECT REM PT RETURN 9FT ADLT (ELECTROSURGICAL) ×2
ELECTRODE REM PT RTRN 9FT ADLT (ELECTROSURGICAL) ×1 IMPLANT
GAUZE SPONGE 4X4 12PLY STRL (GAUZE/BANDAGES/DRESSINGS) IMPLANT
GAUZE SPONGE 4X4 12PLY STRL LF (GAUZE/BANDAGES/DRESSINGS) ×1 IMPLANT
GLOVE SURG ENC MOIS LTX SZ7 (GLOVE) ×2 IMPLANT
GLOVE SURG UNDER POLY LF SZ7.5 (GLOVE) ×2 IMPLANT
GOWN STRL REUS W/ TWL LRG LVL3 (GOWN DISPOSABLE) ×2 IMPLANT
GOWN STRL REUS W/TWL LRG LVL3 (GOWN DISPOSABLE) ×4
HANDPIECE INTERPULSE COAX TIP (DISPOSABLE) ×2
IV NS 1000ML (IV SOLUTION) ×2
IV NS 1000ML BAXH (IV SOLUTION) ×1 IMPLANT
KIT BASIN OR (CUSTOM PROCEDURE TRAY) ×2 IMPLANT
KIT TURNOVER KIT B (KITS) ×2 IMPLANT
MANIFOLD NEPTUNE II (INSTRUMENTS) ×2 IMPLANT
NEEDLE 22X1 1/2 (OR ONLY) (NEEDLE) IMPLANT
NS IRRIG 1000ML POUR BTL (IV SOLUTION) ×2 IMPLANT
PACK ORTHO EXTREMITY (CUSTOM PROCEDURE TRAY) ×2 IMPLANT
PAD ABD 8X10 STRL (GAUZE/BANDAGES/DRESSINGS) ×1 IMPLANT
PAD ARMBOARD 7.5X6 YLW CONV (MISCELLANEOUS) ×4 IMPLANT
SET HNDPC FAN SPRY TIP SCT (DISPOSABLE) ×1 IMPLANT
SYR CONTROL 10ML LL (SYRINGE) IMPLANT
TOWEL GREEN STERILE (TOWEL DISPOSABLE) ×2 IMPLANT

## 2020-11-18 NOTE — Interval H&P Note (Signed)
History and Physical Interval Note:  11/18/2020 12:27 PM  Virginia [name]  has presented today for surgery, with the diagnosis of Right Hallux Cellulitis.  The various methods of treatment have been discussed with the patient and family. After consideration of risks, benefits and other options for treatment, the patient has consented to  Procedure(s): AMPUTATION HALLUX TOE (Right) as a surgical intervention.  The patient's history has been reviewed, patient examined, no change in status, stable for surgery.  I have reviewed the patient's chart and labs.  Questions were answered to the patient's satisfaction.     Candelaria Stagers

## 2020-11-18 NOTE — Plan of Care (Signed)
  Problem: Education: Goal: Knowledge of General Education information will improve Description Including pain rating scale, medication(s)/side effects and non-pharmacologic comfort measures Outcome: Progressing   

## 2020-11-18 NOTE — Transfer of Care (Signed)
Immediate Anesthesia Transfer of Care Note  Patient: Virginia [name]  Procedure(s) Performed: AMPUTATION HALLUX TOE (Right)  Patient Location: PACU  Anesthesia Type:General  Level of Consciousness: awake, alert  and oriented  Airway & Oxygen Therapy: Patient Spontanous Breathing  Post-op Assessment: Report given to RN and Post -op Vital signs reviewed and stable  Post vital signs: Reviewed and stable  Last Vitals:  Vitals Value Taken Time  BP 133/69 11/18/20 1327  Temp 36.6 C 11/18/20 1327  Pulse 84 11/18/20 1334  Resp 15 11/18/20 1334  SpO2 99 % 11/18/20 1334  Vitals shown include unvalidated device data.  Last Pain:  Vitals:   11/18/20 1136  TempSrc: Oral  PainSc:          Complications: No notable events documented.

## 2020-11-18 NOTE — Progress Notes (Signed)
FPTS Brief Progress Note  S: Pt is s/p amputation of the right hallux toe. She is slightly nauseous after the procedure. Her pain is well controlled.    O: BP 139/76 (BP Location: Left Arm)   Pulse 77   Temp 97.8 F (36.6 C) (Oral)   Resp 17   Ht 5\' 6"  (1.676 m)   Wt 114.8 kg   SpO2 100%   BMI 40.84 kg/m   General: Alert and oriented in no apparent distress Heart: Regular rate and rhythm with no murmurs appreciated Skin: Warm and dry, R toe wrapped in dressing with minimal strikethrough Extremities: No lower extremity edema   A/P: Plan per day team  - Orders reviewed. Labs for AM not ordered, which was adjusted as needed.  - Zofran ordered for nausea x1 dose, no concern for Qtc prolongation given last EKG  -Follow up podiatry recs   , MD 11/18/2020, 11:54 PM PGY-1, [facility] Family Medicine Night Resident  Please page [phone] with questions.

## 2020-11-18 NOTE — Anesthesia Procedure Notes (Signed)
Procedure Name: LMA Insertion Date/Time: 11/18/2020 12:49 PM Performed by: Laruth Bouchard., CRNA Pre-anesthesia Checklist: Patient identified, Emergency Drugs available, Suction available, Patient being monitored and Timeout performed Patient Re-evaluated:Patient Re-evaluated prior to induction Oxygen Delivery Method: Circle system utilized Preoxygenation: Pre-oxygenation with 100% oxygen Induction Type: IV induction LMA: LMA inserted LMA Size: 4.0 Number of attempts: 1 Placement Confirmation: positive ETCO2 Tube secured with: Tape Dental Injury: Teeth and Oropharynx as per pre-operative assessment

## 2020-11-18 NOTE — Anesthesia Postprocedure Evaluation (Signed)
Anesthesia Post Note  Patient: Virginia [name]  Procedure(s) Performed: AMPUTATION HALLUX TOE (Right)     Patient location during evaluation: PACU Anesthesia Type: General Level of consciousness: awake and alert Pain management: pain level controlled Vital Signs Assessment: post-procedure vital signs reviewed and stable Respiratory status: spontaneous breathing, nonlabored ventilation, respiratory function stable and patient connected to nasal cannula oxygen Cardiovascular status: blood pressure returned to baseline and stable Postop Assessment: no apparent nausea or vomiting Anesthetic complications: no   No notable events documented.  Last Vitals:  Vitals:   11/18/20 1357 11/18/20 1440  BP: (!) 154/77 (!) 147/115  Pulse: 85 90  Resp: 15 16  Temp: 36.7 C 36.4 C  SpO2: 96% 98%    Last Pain:  Vitals:   11/18/20 1739  TempSrc:   PainSc: 4                  Geraldo Haris S

## 2020-11-18 NOTE — Hospital Course (Addendum)
Osteomyelitis of the right great toe Pt presenting with R great toe ulcer. MRI showing osteo of the distal phalanx. Pt started on BS ABX (Vanc, Zosyn, Flagyl, 9/3-9/5). Amputation of the R hallux on 9/4. Doxycycline was started on 9/5 and prescribed for a 12 day course. Patient was ambulatory on day of D/C. Patient is to follow up with podiatrist (Dr. Allena Katz) next week.    Type 2 Diabetes Mellitus, uncontrolled A1c 10.7 on admission. Home medication regimen of Jardiance 25 mg daily, insulin glargine 50 units daily, and Victoza (patient not taking due to GI intolerance). Patient received glargine 25 units daily, sliding scale insulin, and home Jardiance while in the hospital. Her blood sugar control was adequate on this regimen. She was discharged on her home regimen.    Charcot Joint of Left Foot Has had left great toe amputated.  Currently stable and following with podiatry.   Elevated alkaline phosphatase Level of 157 on admission. Likely secondary to osteomyelitis. Low level of suspicion for gallbladder pathology.    Hyperlipidemia Continued home medication, Atorvastatin 40 mg daily   Hypertension Continued home irbesartan 75 mg and home hydrochlorothiazide 25 mg.

## 2020-11-18 NOTE — Progress Notes (Signed)
FPTS Interim Night Progress Note  S:Patient sleeping comfortably.  Rounded with primary night RN.  No concerns voiced.  No orders required.    O: Today's Vitals   11/17/20 1756 11/17/20 1951 11/17/20 2100 11/17/20 2312  BP: 105/69 117/69  108/67  Pulse: 80 78  80  Resp: 16 19  17   Temp: 98.3 F (36.8 C) 98.2 F (36.8 C)  97.8 F (36.6 C)  TempSrc: Oral Oral  Oral  SpO2: 100% 96%  100%  Weight:      Height:      PainSc:   0-No pain       A/P: Continue current management  MD PGY-3, [facility] Family Medicine Service pager [phone]

## 2020-11-18 NOTE — Consult Note (Signed)
  Subjective:  Patient ID: Virginia [name], female    DOB: November 09, 1970,  MRN: [id number]  A 50 y.o. female presents with Charcot joint, type 2 diabetes mellitus uncontrolled, hyperlipidemia, hypertension, BPPV, and history of cervical polyp with right hallux osteomyelitis concerning for infection and cellulitis.  Patient states he came out of nowhere and has progressed to gotten worse.  Patient was seen by me in clinic 2 days ago and was sent to the emergency room to be admitted.  She did not have any systemic signs of infection.  She denies any nausea fever chills vomiting. Objective:   Vitals:   11/18/20 0403 11/18/20 1136  BP: 103/66 (!) 162/77  Pulse: 72 86  Resp: 17 18  Temp: 98.3 F (36.8 C) 98.2 F (36.8 C)  SpO2: 100% 100%   General AA&O x3. Normal mood and affect.  Vascular Dorsalis pedis and posterior tibial pulses 2/4 bilat. Brisk capillary refill to all digits. Pedal hair present.  Neurologic Epicritic sensation grossly intact.  Dermatologic Right hallux epidermal lysis with wound probing down directly to the distal phalanx of the bone.  Cellulitis noted up to the MPJ.  Malodor present.  Purulent drainage noted.  Orthopedic: MMT 5/5 in dorsiflexion, plantarflexion, inversion, and eversion. Normal joint ROM without pain or crepitus.    Assessment & Plan:  Patient was evaluated and treated and all questions answered.  Right hallux osteomyelitis -I explained to the patient the etiology of osteomyelitis and various treatment options were extensively discussed. -I saw the patient today and made the decision to take her to the operating room for right hallux partial amputation.  Patient agrees with the plan.  I discussed all the risks and benefits with the patient extensive detail she states understand -She will be okay to be discharged from my standpoint after the surgery either today at the end of the day or tomorrow. -She can be sent out on p.o. doxycycline for 14 days. -She will  be weightbearing as tolerated in surgical shoe.  She already has surgical shoe at bedside -No dressing change until follow-up  Candelaria Stagers, DPM  Accessible via secure chat for questions or concerns.

## 2020-11-18 NOTE — Progress Notes (Addendum)
Family Medicine Teaching Service Daily Progress Note Intern Pager: [phone]  Patient name: Virginia [name] Medical record number: [id number] Date of birth: 21-Jun-1970 Age: 50 y.o. Gender: female  Primary Care Provider: Lavonda Jumbo, DO Consultants: Podiatry Code Status: FULL  Pt Overview and Major Events to Date:  9/2: admitted, started on BS ABX 9/3: scheduled for surgery  Assessment and Plan: Tamecca C Ogletree is a 50 y.o. female presenting with right great toe osteomyelitis.PMH is significant for Charcot joint 2/2 previous amputation, type 2 diabetes mellitus uncontrolled, hyperlipidemia, hypertension, BPPV, and history of cervical polyp.   Osteomyelitis of the right great toe MRI right foot showing osteo of the right great toe.  Podiatry planning for surgery at noon, primary team not informed until this morning, no note in chart.  Patient has been n.p.o. since midnight. Is still on heparin.  -Amputation of R Hallux scheduled for noon -Continue IV Vanc, cefepime, Flagyl (9/3-present) -Re-assess ABX after surgery -Pain regimen: Tylenol 1,000 mg q6hrs PRN, Oxy 5-10 mg q6hrs PRN -Repeat CBC/CMP tomorrow AM -PT/OT evaluate and treat 9/5 (order in) -Heart healthy/carb modified diet  Type 2 Diabetes Mellitus, uncontrolled A1c 10.7 on admission. Home medication regimen of Jardiance 25 mg daily, insulin glargine 50 units daily, and Victoza (patient not taking due to GI intolerance). -Glargine 25 units daily held this a.m., starting again tomorrow morning (order in) -Sliding scale insulin per surgery, restart when back on floor -Continue Jardiance 25 mg daily  Elevated alkaline phosphatase On labs today was 157.  This is likely secondary to osteomyelitis, no abdominal pain on examination today low suspicion for gallbladder pathology -monitor on CMP   Charcot Joint of Left Foot Has had left great toe amputated.  Currently stable and following with podiatry.   Hyperlipidemia -Continue  home medication Atorvastatin 40 mg daily   Hypertension -Continue home irbesartan 75 mg and home hydrochlorothiazide 25 mg   BPPV -Resolved in 2020, currently stable   FEN/GI: Heart healthy carb modified diet Prophylaxis: Heparin Dispo: home in 2-3 days  Subjective:  Patient denies pain and is ready for OR today at noon. No other complaints. ROS negative for CP, SOB, N/V, abnormal BM.   Objective: Temp:  [97.4 F (36.3 C)-98.3 F (36.8 C)] 98.3 F (36.8 C) (09/04 0403) Pulse Rate:  [72-92] 72 (09/04 0403) Resp:  [16-19] 17 (09/04 0403) BP: (103-137)/(66-81) 103/66 (09/04 0403) SpO2:  [96 %-100 %] 100 % (09/04 0403) Physical Exam Vitals reviewed.  Cardiovascular:     Rate and Rhythm: Normal rate and regular rhythm.  Pulmonary:     Effort: Pulmonary effort is normal.  Abdominal:     General: Bowel sounds are normal.     Palpations: Abdomen is soft.     Tenderness: There is no abdominal tenderness.  Extremities: R great toe with ulceration   Laboratory: Recent Labs  Lab 11/16/20 2042 11/17/20 0510 11/18/20 0312  WBC 9.7 7.8 7.1  HGB 13.4 12.9 12.1  HCT 43.8 41.9 38.6  PLT 272 223 239   Recent Labs  Lab 11/16/20 2042 11/17/20 0510 11/18/20 0312  NA 138 138 135  K 4.2 4.2 3.9  CL 101 105 103  CO2 27 23 24   BUN 25* 28* 26*  CREATININE 1.03* 1.00 0.90  CALCIUM 9.7 9.1 8.8*  PROT 8.0 7.4 6.7  BILITOT 0.7 0.8 0.6  ALKPHOS 157* 141* 129*  ALT 17 15 13   AST 16 14* 18  GLUCOSE 206* 207* 157*    Imaging/Diagnostic Tests: MRI showing  osteo of the R great toe   Carlyn Reichert PGY-1, Psychiatry FPTS Intern pager: [phone], text pages welcome

## 2020-11-18 NOTE — Anesthesia Preprocedure Evaluation (Signed)
Anesthesia Evaluation  Patient identified by MRN, date of birth, ID band Patient awake    Reviewed: Allergy & Precautions, H&P , NPO status , Patient's Chart, lab work & pertinent test results  Airway Mallampati: II   Neck ROM: full    Dental   Pulmonary former smoker,    breath sounds clear to auscultation       Cardiovascular hypertension,  Rhythm:regular Rate:Normal     Neuro/Psych  Neuromuscular disease    GI/Hepatic   Endo/Other  diabetes, Type obesity  Renal/GU      Musculoskeletal  (+) Arthritis ,   Abdominal   Peds  Hematology   Anesthesia Other Findings   Reproductive/Obstetrics                             Anesthesia Physical Anesthesia Plan  ASA: 2  Anesthesia Plan: General   Post-op Pain Management:    Induction: Intravenous  PONV Risk Score and Plan: 3 and Ondansetron, Dexamethasone, Midazolam and Treatment may vary due to age or medical condition  Airway Management Planned: LMA  Additional Equipment:   Intra-op Plan:   Post-operative Plan: Extubation in OR  Informed Consent: I have reviewed the patients History and Physical, chart, labs and discussed the procedure including the risks, benefits and alternatives for the proposed anesthesia with the patient or authorized representative who has indicated his/her understanding and acceptance.     Dental advisory given  Plan Discussed with: CRNA, Anesthesiologist and Surgeon  Anesthesia Plan Comments:         Anesthesia Quick Evaluation

## 2020-11-18 NOTE — Op Note (Signed)
Surgeon: Surgeon(s): Candelaria Stagers, DPM  Assistants: None Pre-operative diagnosis: Right Hallux Cellulitis  Post-operative diagnosis: same Procedure: Procedure(s) (LRB): AMPUTATION HALLUX TOE (Right)  Pathology:  ID Type Source Tests Collected by Time Destination  1 : Right great Toe Tissue PATH Digit amputation SURGICAL PATHOLOGY Candelaria Stagers, DPM 11/18/2020 1301     Pertinent Intra-op findings: Osteomyelitis noted in the distal phalanx with soft friable bone Anesthesia: General  Hemostasis: * No tourniquets in log * EBL: 25 mL  Materials: 3-0 Prolene Injectables: 20 cc of half percent Marcaine plain Complications: None  Indications for surgery: A 50 y.o. female presents with right hallux osteomyelitis. Patient has failed all conservative therapy including but not limited to local wound care and IV antibiotics. She wishes to have surgical correction of the foot/deformity. It was determined that patient would benefit from right hallux amputation partial. Informed surgical risk consent was reviewed and read aloud to the patient.  I reviewed the films.  I have discussed my findings with the patient in great detail.  I have discussed all risks including but not limited to infection, stiffness, scarring, limp, disability, deformity, damage to blood vessels and nerves, numbness, poor healing, need for braces, arthritis, chronic pain, amputation, death.  All benefits and realistic expectations discussed in great detail.  I have made no promises as to the outcome.  I have provided realistic expectations.  I have offered the patient a 2nd opinion, which they have declined and assured me they preferred to proceed despite the risks   Procedure in detail: The patient was both verbally and visually identified by myself, the nursing staff, and anesthesia staff in the preoperative holding area. They were then transferred to the operating room and placed on the operative table in supine  position.  Attention was directed to the right hallux, a skin marker was used to delineate a fishmouth style incision.  Using #15 blade the incision was carried down to deep tissue including the bone.  The digit was disarticulated at level of the IPJ.  This was sent to pathology in standard technique.  At this time it was determined that given that patient does not have skin for closure of partial hallux bone was removed using sagittal saw in standard technique.  The bone was sent to pathology in standard technique.  No further area of infection noted.  The wound was thoroughly irrigated with normal saline solution.  The wound was primarily closed with 3-0 Prolene.  The incision was dressed with Xeroform 4 x 4 gauze Betadine Kerlix.  All bony prominences were adequately padded.  Adequate bleeding noted.  At the conclusion of the procedure the patient was awoken from anesthesia and found to have tolerated the procedure well any complications. There were transferred to PACU with vital signs stable and vascular status intact.  Nicholes Rough, DPM

## 2020-11-19 ENCOUNTER — Encounter (HOSPITAL_COMMUNITY): Payer: Self-pay | Admitting: Podiatry

## 2020-11-19 DIAGNOSIS — Z89431 Acquired absence of right foot: Secondary | ICD-10-CM

## 2020-11-19 DIAGNOSIS — E1165 Type 2 diabetes mellitus with hyperglycemia: Secondary | ICD-10-CM

## 2020-11-19 LAB — BASIC METABOLIC PANEL
Anion gap: 11 (ref 5–15)
BUN: 18 mg/dL (ref 6–20)
CO2: 25 mmol/L (ref 22–32)
Calcium: 9.7 mg/dL (ref 8.9–10.3)
Chloride: 103 mmol/L (ref 98–111)
Creatinine, Ser: 0.83 mg/dL (ref 0.44–1.00)
GFR, Estimated: >60 mL/min (ref >60)
Glucose, Bld: 151 mg/dL — ABNORMAL HIGH (ref 70–99)
Potassium: 4.1 mmol/L (ref 3.5–5.1)
Sodium: 139 mmol/L (ref 135–145)

## 2020-11-19 LAB — GLUCOSE, CAPILLARY
Glucose-Capillary: 136 mg/dL — ABNORMAL HIGH (ref 70–99)
Glucose-Capillary: 179 mg/dL — ABNORMAL HIGH (ref 70–99)

## 2020-11-19 LAB — CBC
HCT: 39.2 % (ref 36.0–46.0)
Hemoglobin: 12 g/dL (ref 12.0–15.0)
MCH: 23.8 pg — ABNORMAL LOW (ref 26.0–34.0)
MCHC: 30.6 g/dL (ref 30.0–36.0)
MCV: 77.8 fL — ABNORMAL LOW (ref 80.0–100.0)
Platelets: 252 10*3/uL (ref 150–400)
RBC: 5.04 MIL/uL (ref 3.87–5.11)
RDW: 15.9 % — ABNORMAL HIGH (ref 11.5–15.5)
WBC: 7.9 10*3/uL (ref 4.0–10.5)
nRBC: 0 % (ref 0.0–0.2)

## 2020-11-19 MED ORDER — DOXYCYCLINE HYCLATE 100 MG PO TABS: 100.0000 mg | ORAL_TABLET | Freq: Two times a day (BID) | ORAL | 0 refills | Status: AC

## 2020-11-19 NOTE — Discharge Summary (Signed)
[facility] Discharge Summary  Patient name: Virginia [name] Medical record number: [id number] Date of birth: 1970-12-31 Age: 50 y.o. Gender: female Date of Admission: 11/16/2020  Date of Discharge: 11/19/2020 Admitting Physician: Lenoria Chime, MD  Primary Care Provider: Gerlene Fee, DO Consultants: Podiatry  Indication for Hospitalization: Osteomyelitis of the R great toe  Discharge Diagnoses/Problem List:  Osteomyelitis of the R great toe T2DM, with insulin dependence Charcot joint of left foot Elevated alkaline phosphatase Hyperlipidemia HTN  Disposition: Home, self-care  Discharge Condition: stable  Discharge Exam:  Physical Exam Vitals reviewed.  Cardiovascular:     Rate and Rhythm: Normal rate and regular rhythm.  Pulmonary:     Effort: Pulmonary effort is normal.     Breath sounds: Normal breath sounds.  Abdominal:     General: Bowel sounds are normal.     Palpations: Abdomen is soft.     Tenderness: There is no abdominal tenderness.  Neurological:     Mental Status: She is alert.    Brief Hospital Course:  Osteomyelitis of the right great toe Pt presenting with R great toe ulcer. MRI showing osteo of the distal phalanx. Pt started on BS ABX (Vanc, Zosyn, Flagyl, 9/3-9/5). Amputation of the R hallux on 9/4. Doxycycline was started on 9/5 and prescribed for a 12 day course. Patient was ambulatory on day of D/C. Patient is to follow up with podiatrist (Dr. Posey Pronto) next week.    Type 2 Diabetes Mellitus, uncontrolled A1c 10.7 on admission. Home medication regimen of Jardiance 25 mg daily, insulin glargine 50 units daily, and Victoza (patient not taking due to GI intolerance). Patient received glargine 25 units daily, sliding scale insulin, and home Jardiance while in the hospital. Her blood sugar control was adequate on this regimen. She was discharged on her home regimen.    Charcot Joint of Left Foot Has had left great toe amputated.   Currently stable and following with podiatry.   Elevated alkaline phosphatase Level of 157 on admission. Likely secondary to osteomyelitis. Low level of suspicion for gallbladder pathology.    Hyperlipidemia Continued home medication, Atorvastatin 40 mg daily   Hypertension Continued home irbesartan 75 mg and home hydrochlorothiazide 25 mg.    Issues for Follow Up:  Continued work on diabetic regimen and improving compliance (patient encouraged to follow up with PCP).   Significant Procedures: R hallux amputation   Significant Labs and Imaging:  Recent Labs  Lab 11/17/20 0510 11/18/20 0312 11/19/20 0630  WBC 7.8 7.1 7.9  HGB 12.9 12.1 12.0  HCT 41.9 38.6 39.2  PLT 223 239 252   Recent Labs  Lab 11/16/20 2042 11/17/20 0510 11/18/20 0312 11/19/20 0630  NA 138 138 135 139  K 4.2 4.2 3.9 4.1  CL 101 105 103 103  CO2 _0 GLUCOSE 206* 207* 157* 151*  BUN 25* 28* 26* 18  CREATININE 1.03* 1.00 0.90 0.83  CALCIUM 9.7 9.1 8.8* 9.7  ALKPHOS 157* 141* 129*  --   AST 16 14* 18  --   ALT _1 --   ALBUMIN 3.9 3.6 3.1*  --     Results/Tests Pending at Time of Discharge:  None  Discharge Medications:  Allergies as of 11/19/2020       Reactions   Liraglutide Nausea And Vomiting   Projectile Vomiting   Shrimp [shellfish Allergy] Shortness Of Breath   Ace Inhibitors Cough   Metformin And Related Diarrhea, Nausea And Vomiting  Rosuvastatin Other (See Comments)   Myalgia - Legs reported - resolved upon discontinuation.         Medication List     STOP taking these medications    liraglutide 18 MG/3ML Sopn Commonly known as: VICTOZA   terbinafine 250 MG tablet Commonly known as: LAMISIL       TAKE these medications    acetaminophen 500 MG tablet Commonly known as: TYLENOL Take 1,000 mg by mouth every 6 (six) hours as needed for moderate pain or headache.   Alcohol Pads 70 % Pads 1 each by Does not apply route as directed.    atorvastatin 40 MG tablet Commonly known as: LIPITOR Take 1 tablet (40 mg total) by mouth daily.   Basaglar KwikPen 100 UNIT/ML Inject 50 Units into the skin daily. What changed: when to take this   doxycycline 100 MG tablet Commonly known as: VIBRA-TABS Take 1 tablet (100 mg total) by mouth every 12 (twelve) hours for 12 days.   empagliflozin 25 MG Tabs tablet Commonly known as: JARDIANCE Take 1 tablet (25 mg total) by mouth daily.   gabapentin 300 MG capsule Commonly known as: NEURONTIN Take 2 capsules by mouth twice daily   hydrochlorothiazide 25 MG tablet Commonly known as: HYDRODIURIL Take 1 tablet (25 mg total) by mouth daily.   ibuprofen 200 MG tablet Commonly known as: ADVIL Take 400 mg by mouth every 6 (six) hours as needed for headache or moderate pain.   Insulin Pen Needle 31G X 5 MM Misc Use one needle per application   irbesartan 75 MG tablet Commonly known as: AVAPRO Take 1 tablet (75 mg total) by mouth daily.   multivitamin with minerals tablet Take 1 tablet by mouth daily. Reported on 06/13/760   OneTouch Delica Lancets Fine Misc 1 each by Does not apply route 2 (two) times daily.   onetouch ultrasoft lancets Please use to check blood sugar 3 times daily before meals. E11.65   OneTouch Verio test strip Generic drug: glucose blood Use 3 times daily   OneTouch Verio w/Device Kit 1 kit by Does not apply route daily.   QC TUMERIC COMPLEX PO Take 2 tablets by mouth daily. gummy               Discharge Care Instructions  (From admission, onward)           Start     Ordered   11/19/20 0000  Leave dressing on - Keep it clean, dry, and intact until clinic visit        11/19/20 1147            Discharge Instructions: Please refer to Patient Instructions section of EMR for full details.  Patient was counseled important signs and symptoms that should prompt return to medical care, changes in medications, dietary instructions,  activity restrictions, and follow up appointments.   Follow-Up Appointments: Pt to follow up with PCP and Dr. Posey Pronto next week.   Corky Sox PGY-1, Psychiatry

## 2020-11-19 NOTE — Evaluation (Signed)
Occupational Therapy Evaluation Patient Details Name: Virginia [name] MRN: [id number] DOB: 1970-06-21 Today's Date: 11/19/2020    History of Present Illness Pt presented to ED on 9/2 with rt great toe infection. Underwent amputation of rt great toe on 11/18/20. PMH - Charcot joint, DM, HTN, BPPV, lt rotator cuff repair.   Clinical Impression   PTA patient was living with her spouse and 2 adult daughters in a 2-level private residence with bedroom/bathroom on 2nd floor and was grossly I with ADLs/IADLs without AD. Patient was working full-time at a job that required her to be on her feet for long periods of time. Patient currently functioning near baseline demonstrating observed ADLs and ADL transfers with I to Mod I. All education completed. Patient with no questions. Patient does not demonstrate need for continued acute occupational therapy with OT to sign off at this time.     Follow Up Recommendations  No OT follow up    Equipment Recommendations  None recommended by OT    Recommendations for Other Services       Precautions / Restrictions Precautions Precautions: Fall Restrictions Weight Bearing Restrictions: Yes RLE Weight Bearing: Weight bearing as tolerated Other Position/Activity Restrictions: with post-op shoe      Mobility Bed Mobility               General bed mobility comments: Patient seated EOB upon entry.    Transfers Overall transfer level: Independent Equipment used: None                  Balance Overall balance assessment: No apparent balance deficits (not formally assessed)                                         ADL either performed or assessed with clinical judgement   ADL Overall ADL's : Modified independent                                             Vision Patient Visual Report: No change from baseline       Perception     Praxis      Pertinent Vitals/Pain Pain Assessment: 0-10 Pain Score: 3   Pain Location: R great toe Pain Descriptors / Indicators: Sore Pain Intervention(s): Monitored during session;Premedicated before session     Hand Dominance Right   Extremity/Trunk Assessment Upper Extremity Assessment Upper Extremity Assessment: Overall WFL for tasks assessed   Lower Extremity Assessment Lower Extremity Assessment: Defer to PT evaluation   Cervical / Trunk Assessment Cervical / Trunk Assessment: Normal   Communication Communication Communication: No difficulties   Cognition Arousal/Alertness: Awake/alert Behavior During Therapy: WFL for tasks assessed/performed Overall Cognitive Status: Within Functional Limits for tasks assessed                                     General Comments  VSS on RA    Exercises     Shoulder Instructions      Home Living Family/patient expects to be discharged to:: Private residence Living Arrangements: Spouse/significant other;Children (80 y.o. daughter (does not work) and 70 y.o. daughter [facility]). Spouse works 3rd shift) Available Help at Discharge: Family Type of Home: House Home Access:  Stairs to enter [employer] of Steps: 4-5 Entrance Stairs-Rails: Right (Ascending) Home Layout: Two level Alternate Level Stairs-Number of Steps: Flight   Bathroom Shower/Tub: Chief Strategy Officer: Standard     Home Equipment: Information systems manager          Prior Functioning/Environment Level of Independence: Independent        Comments: I with ADLs/IADLs; works full-time        OT Problem List: Pain      OT Treatment/Interventions:      OT Goals(Current goals can be found in the care plan section) Acute Rehab OT Goals Patient Stated Goal: To return home. OT Goal Formulation: With patient  OT Frequency:     Barriers to D/C:            Co-evaluation              AM-PAC OT "6 Clicks" Daily Activity     Outcome Measure Help from another person eating meals?: None Help from  another person taking care of personal grooming?: None Help from another person toileting, which includes using toliet, bedpan, or urinal?: None Help from another person bathing (including washing, rinsing, drying)?: None Help from another person to put on and taking off regular upper body clothing?: None Help from another person to put on and taking off regular lower body clothing?: None 6 Click Score: 24   End of Session Nurse Communication: Mobility status  Activity Tolerance: Patient tolerated treatment well Patient left: Other (comment) (seated EOB)  OT Visit Diagnosis: Pain Pain - Right/Left: Right Pain - part of body: Ankle and joints of foot                Time: 6433-2951 OT Time Calculation (min): 10 min Charges:  OT General Charges $OT Visit: 1 Visit OT Evaluation $OT Eval Low Complexity: 1 Low  Emera Bussie H. OTR/L Supplemental OT, Department of rehab services [phone]  Forest Redwine R H. 11/19/2020, 9:33 AM

## 2020-11-19 NOTE — Plan of Care (Signed)
  Problem: Education: Goal: Knowledge of General Education information will improve Description: Including pain rating scale, medication(s)/side effects and non-pharmacologic comfort measures Outcome: Completed/Met

## 2020-11-19 NOTE — TOC Transition Note (Signed)
Transition of Care [facility]) - CM/SW Discharge Note   Patient Details  Name: Virginia [name] MRN: [id number] Date of Birth: 05/01/1970  [facility]) CM/SW Contact:  Kermit Balo, RN Phone Number: 11/19/2020, 11:57 AM   Clinical Narrative:    Patient is discharging home with self care. No needs per PT/OT and no DME needs.  Pt has transport home.   Final next level of care: Home/Self Care Barriers to Discharge: No Barriers Identified   Patient Goals and CMS Choice        Discharge Placement                       Discharge Plan and Services                                     Social Determinants of Health (SDOH) Interventions     Readmission Risk Interventions No flowsheet data found.

## 2020-11-19 NOTE — Evaluation (Signed)
.Physical Therapy Evaluation Patient Details Name: Virginia [name] MRN: [id number] DOB: 1970-09-13 Today's Date: 11/19/2020   History of Present Illness  Pt presented to ED on 9/2 with rt great toe infection. Underwent amputation of rt great toe on 11/18/20. PMH - Charcot joint, DM, HTN, BPPV, lt rotator cuff repair.  Clinical Impression  Pt doing well with mobility and no further PT needed.  Ready for dc from PT standpoint.     Follow Up Recommendations No PT follow up    Equipment Recommendations  None recommended by PT    Recommendations for Other Services       Precautions / Restrictions Precautions Precautions: None Required Braces or Orthoses: Other Brace Other Brace: post op shoe Restrictions Weight Bearing Restrictions: Yes RLE Weight Bearing: Weight bearing as tolerated Other Position/Activity Restrictions: with post-op shoe      Mobility  Bed Mobility               General bed mobility comments: Patient seated EOB upon entry.    Transfers Overall transfer level: Independent Equipment used: None                Ambulation/Gait Ambulation/Gait assistance: Independent Gait Distance (Feet): 200 Feet Assistive device: None Gait Pattern/deviations: WFL(Within Functional Limits) Gait velocity: normal Gait velocity interpretation: >2.62 ft/sec, indicative of community ambulatory General Gait Details: Steady gait  Stairs Stairs: Yes Stairs assistance: Modified independent (Device/Increase time) Stair Management: One rail Right;Alternating pattern;Step to pattern;Forwards Number of Stairs: 12 General stair comments: Steady on stairs  Wheelchair Mobility    Modified Rankin (Stroke Patients Only)       Balance Overall balance assessment: No apparent balance deficits (not formally assessed)                                           Pertinent Vitals/Pain Pain Assessment: No/denies pain Pain Score: 3  Pain Location: R great  toe Pain Descriptors / Indicators: Sore Pain Intervention(s): Monitored during session;Premedicated before session    Home Living Family/patient expects to be discharged to:: Private residence Living Arrangements: Spouse/significant other;Children Available Help at Discharge: Family Type of Home: House Home Access: Stairs to enter Entrance Stairs-Rails: Right Entrance Stairs-Number of Steps: 4-5 Home Layout: Two level Home Equipment: Shower seat      Prior Function Level of Independence: Independent         Comments: I with ADLs/IADLs; works full-time     Higher education careers adviser   Dominant Hand: Right    Extremity/Trunk Assessment   Upper Extremity Assessment Upper Extremity Assessment: Defer to OT evaluation    Lower Extremity Assessment Lower Extremity Assessment: Overall WFL for tasks assessed    Cervical / Trunk Assessment Cervical / Trunk Assessment: Normal  Communication   Communication: No difficulties  Cognition Arousal/Alertness: Awake/alert Behavior During Therapy: WFL for tasks assessed/performed Overall Cognitive Status: Within Functional Limits for tasks assessed                                        General Comments General comments (skin integrity, edema, etc.): VSS on RA    Exercises     Assessment/Plan    PT Assessment Patent does not need any further PT services  PT Problem List         PT Treatment  Interventions      PT Goals (Current goals can be found in the Care Plan section)  Acute Rehab PT Goals Patient Stated Goal: To return home. PT Goal Formulation: All assessment and education complete, DC therapy    Frequency     Barriers to discharge        Co-evaluation               AM-PAC PT "6 Clicks" Mobility  Outcome Measure Help needed turning from your back to your side while in a flat bed without using bedrails?: None Help needed moving from lying on your back to sitting on the side of a flat bed  without using bedrails?: None Help needed moving to and from a bed to a chair (including a wheelchair)?: None Help needed standing up from a chair using your arms (e.g., wheelchair or bedside chair)?: None Help needed to walk in hospital room?: None Help needed climbing 3-5 steps with a railing? : None 6 Click Score: 24    End of Session   Activity Tolerance: Patient tolerated treatment well Patient left: in bed;with call bell/phone within reach;with family/visitor present Nurse Communication: Mobility status PT Visit Diagnosis: Other abnormalities of gait and mobility (R26.89)    Time: 1040-1050 PT Time Calculation (min) (ACUTE ONLY): 10 min   Charges:   PT Evaluation $PT Eval Low Complexity: 1 Low          [facility] PT Acute Rehabilitation Services Pager [phone] Office [phone]   Angelina Ok [facility] 11/19/2020, 11:19 AM

## 2020-11-20 ENCOUNTER — Encounter: Payer: Self-pay | Admitting: Podiatry

## 2020-11-21 LAB — SURGICAL PATHOLOGY

## 2020-11-22 ENCOUNTER — Encounter: Payer: Self-pay | Admitting: Sports Medicine

## 2020-11-22 NOTE — Progress Notes (Signed)
Patient called answering service stating that she got her dressing wet while taking a shower tonight. Patient confirmed that it's completely saturated. I advised her to change her dressing and apply dry gauze and tumble dry her ACE wrap to reuse or paper tape with a sock to secure the gauze until she can be seen in the AM for her post op appointment with Dr. Allena Katz.  -Dr. Marylene Land

## 2020-11-23 ENCOUNTER — Other Ambulatory Visit: Payer: Self-pay

## 2020-11-23 ENCOUNTER — Ambulatory Visit (INDEPENDENT_AMBULATORY_CARE_PROVIDER_SITE_OTHER): Payer: 59

## 2020-11-23 ENCOUNTER — Ambulatory Visit (INDEPENDENT_AMBULATORY_CARE_PROVIDER_SITE_OTHER): Payer: 59 | Admitting: Podiatry

## 2020-11-23 DIAGNOSIS — Z89419 Acquired absence of unspecified great toe: Secondary | ICD-10-CM

## 2020-11-23 NOTE — Progress Notes (Signed)
Subjective:  Patient ID: Virginia [name], female    DOB: 07-25-1970,  MRN: [id number]  Chief Complaint  Patient presents with   Routine Post Op    POST OP     DOS: 11/18/2020 Procedure: Right hallux amputation partial  50 y.o. female returns for post-op check.  Patient states she is doing well.  Mild soreness no acute complaints.  Ambulating with surgical shoe.  She still has her antibiotics that she is completing.  She is a diabetic with last A1c of 10.7  Review of Systems: Negative except as noted in the HPI. Denies N/V/F/Ch.  Past Medical History:  Diagnosis Date   Diabetes mellitus    Hypertension    on HCTZ   Obesity    Wears glasses     Current Outpatient Medications:    acetaminophen (TYLENOL) 500 MG tablet, Take 1,000 mg by mouth every 6 (six) hours as needed for moderate pain or headache., Disp: , Rfl:    Alcohol Swabs (ALCOHOL PADS) 70 % PADS, 1 each by Does not apply route as directed., Disp: 90 each, Rfl: 3   atorvastatin (LIPITOR) 40 MG tablet, Take 1 tablet (40 mg total) by mouth daily., Disp: 90 tablet, Rfl: 3   Blood Glucose Monitoring Suppl (ONETOUCH VERIO) w/Device KIT, 1 kit by Does not apply route daily., Disp: 1 kit, Rfl: 1   doxycycline (VIBRA-TABS) 100 MG tablet, Take 1 tablet (100 mg total) by mouth every 12 (twelve) hours for 12 days., Disp: 24 tablet, Rfl: 0   empagliflozin (JARDIANCE) 25 MG TABS tablet, Take 1 tablet (25 mg total) by mouth daily., Disp: 90 tablet, Rfl: 3   gabapentin (NEURONTIN) 300 MG capsule, Take 2 capsules by mouth twice daily (Patient taking differently: Take 600 mg by mouth 2 (two) times daily.), Disp: 360 capsule, Rfl: 0   glucose blood (ONETOUCH VERIO) test strip, Use 3 times daily, Disp: 100 each, Rfl: 12   hydrochlorothiazide (HYDRODIURIL) 25 MG tablet, Take 1 tablet (25 mg total) by mouth daily., Disp: 90 tablet, Rfl: 3   ibuprofen (ADVIL) 200 MG tablet, Take 400 mg by mouth every 6 (six) hours as needed for headache or moderate  pain., Disp: , Rfl:    Insulin Glargine (BASAGLAR KWIKPEN) 100 UNIT/ML, Inject 50 Units into the skin daily. (Patient taking differently: Inject 50 Units into the skin every evening.), Disp: 15 mL, Rfl: 2   Insulin Pen Needle 31G X 5 MM MISC, Use one needle per application, Disp: 832 each, Rfl: 6   irbesartan (AVAPRO) 75 MG tablet, Take 1 tablet (75 mg total) by mouth daily., Disp: 90 tablet, Rfl: 3   Lancets (ONETOUCH ULTRASOFT) lancets, Please use to check blood sugar 3 times daily before meals. E11.65, Disp: 100 each, Rfl: 12   Multiple Vitamins-Minerals (MULTIVITAMIN WITH MINERALS) tablet, Take 1 tablet by mouth daily. Reported on 07/09/2015, Disp: , Rfl:    ONETOUCH DELICA LANCETS FINE MISC, 1 each by Does not apply route 2 (two) times daily., Disp: 100 each, Rfl: 5   Turmeric (QC TUMERIC COMPLEX PO), Take 2 tablets by mouth daily. gummy, Disp: , Rfl:   Social History   Tobacco Use  Smoking Status Former   Packs/day: 0.25   Years: 7.00   Pack years: 1.75   Types: Cigarettes   Start date: 03/18/1991   Quit date: 05/03/1998   Years since quitting: 22.5  Smokeless Tobacco Never  Tobacco Comments   Works at [employer] does NOT smoke.  Allergies  Allergen Reactions   Liraglutide Nausea And Vomiting    Projectile Vomiting   Shrimp [Shellfish Allergy] Shortness Of Breath   Ace Inhibitors Cough   Metformin And Related Diarrhea and Nausea And Vomiting   Rosuvastatin Other (See Comments)    Myalgia - Legs reported - resolved upon discontinuation.    Objective:  There were no vitals filed for this visit. There is no height or weight on file to calculate BMI. Constitutional Well developed. Well nourished.  Vascular Foot warm and well perfused. Capillary refill normal to all digits.   Neurologic Normal speech. Oriented to person, place, and time. Epicritic sensation to light touch grossly present bilaterally.  Dermatologic Skin healing well without signs of infection. Skin  edges well coapted without signs of infection.  Orthopedic: Tenderness to palpation noted about the surgical site.   Radiographs: 3 views of skeletally mature the right foot: Partial hallux amputation noted.  No signs of bone infection noted.  No cortical destruction noted.  No soft tissue emphysema noted. Assessment:   1. History of amputation of great toe [facility])    Plan:  Patient was evaluated and treated and all questions answered.  S/p foot surgery right -Progressing as expected post-operatively. -XR: See above -WB Status: Weightbearing as tolerated in surgical shoe -Sutures: Intact.  No clinical signs of dehiscence noted no complication noted. -Medications: None -Foot redressed.  No follow-ups on file.

## 2020-11-30 ENCOUNTER — Other Ambulatory Visit (HOSPITAL_COMMUNITY): Payer: Self-pay

## 2020-12-05 ENCOUNTER — Encounter: Payer: Self-pay | Admitting: Family Medicine

## 2020-12-05 ENCOUNTER — Ambulatory Visit (INDEPENDENT_AMBULATORY_CARE_PROVIDER_SITE_OTHER): Payer: 59 | Admitting: Podiatry

## 2020-12-05 ENCOUNTER — Other Ambulatory Visit: Payer: Self-pay

## 2020-12-05 ENCOUNTER — Ambulatory Visit (INDEPENDENT_AMBULATORY_CARE_PROVIDER_SITE_OTHER): Payer: 59 | Admitting: Family Medicine

## 2020-12-05 VITALS — BP 137/77 | HR 91 | Wt 254.0 lb

## 2020-12-05 DIAGNOSIS — Z89419 Acquired absence of unspecified great toe: Secondary | ICD-10-CM | POA: Diagnosis not present

## 2020-12-05 DIAGNOSIS — Z23 Encounter for immunization: Secondary | ICD-10-CM | POA: Diagnosis not present

## 2020-12-05 DIAGNOSIS — E1165 Type 2 diabetes mellitus with hyperglycemia: Secondary | ICD-10-CM

## 2020-12-05 DIAGNOSIS — I10 Essential (primary) hypertension: Secondary | ICD-10-CM | POA: Diagnosis not present

## 2020-12-05 DIAGNOSIS — E782 Mixed hyperlipidemia: Secondary | ICD-10-CM | POA: Diagnosis not present

## 2020-12-05 DIAGNOSIS — Z89431 Acquired absence of right foot: Secondary | ICD-10-CM

## 2020-12-05 MED ORDER — SEMAGLUTIDE(0.25 OR 0.5MG/DOS) 2 MG/1.5ML ~~LOC~~ SOPN: 0.2500 mg | PEN_INJECTOR | SUBCUTANEOUS | 3 refills | Status: DC

## 2020-12-05 NOTE — Patient Instructions (Addendum)
Good to see you today - Thank you for coming in  Things we discussed today:  Will send in a prescription for Ozempic once a week.  If you are not able to get or if you are having any problems let us know Call us if your blood sugar are getting too low Please bring in your meter next visit    Your goal blood pressure is less than 140/90.  Check your blood pressure several times a week.  If regularly higher than this please let me know - either with MyChart or leaving a phone message. Next visit please bring in your blood pressure cuff.     Please always bring your medication bottles  Come back to see me in one month with Dr Salvadore Dom

## 2020-12-05 NOTE — Progress Notes (Signed)
SUBJECTIVE:   CHIEF COMPLAINT: Diabetes HPI:   Diabetes Symptoms: She does have neuropathy of her feet, and is status post 1st toe amputation this week. Healing well. She does have symptoms of hypoglycemia was a week ago.  Denies chest pain, palpitations, shortness of breath, exertional dyspnea, lightheadedness.  Positive for occasional hypoglycemic episodes and neuropathy of feet. Diet: Not drinking soda, diet soda and water. Trying to avoid carbs, for about 2-3 weeks now. She intermittent fasting, 8pm to 12pm. We discussed importance of avoiding hypoglycemia as she is on insulin. Medications: Jardiance, and taking 50 units basal insulin (glargine) at night. Failed metformin due to intolerable GI side-effects. Failed liraglutide, with intractable vomiting. We extensively discussed GLP-1 medications as she was not avoiding carbs with the medication and this is known to make GI symptoms worse. She is agreeable to 1nce a week Ozempic if insurance can cover. Agreeable to meal time insulin as last resort.  Blood sugars: Checking sugars in the morning, fasting is 120-130. She is interested in continuous glucose monitoring.  We discussed that continuous glucose monitoring is likely not needed at this time, and we will reassess tat another time. A1c: Last A1c was 11/2020 and was 10.7.  Occupation/exercise: She is getting 10k steps a day at work. She has a stressful job working 7-14 days without a day off. Eye exam: Last saw eye doctor last year, so she is due. Foot exam: Checking her feet regularly now. Denies any wounds.  Right first toe amputation Patient follows with podiatry.  Her last visit was this morning December 05, 2020.  They report that it is healing well, and they will be the ones to clear her for work.  She is ambulating well with boot.  Hypertension Hypertension has been well controlled.  She is currently taking hydrochlorothiazide, and irbesartan.  Compliant without side  effects.  Hyperlipidemia She is taking atorvastatin 40 mg once per day.  Compliant and without side effects.  ROS: As above.  All other ROS negative.   Virginia [name] is a 50 y.o. yo with history notable for status post toe amputation, uncontrolled type 2 diabetes, diabetic neuropathy, hypertension, hyperlipidemia presenting for management of her diabetes and comorbid conditions.   PERTINENT  PMH / PSH/Family/Social History : Reviewed and updated.  OBJECTIVE:   BP 137/77   Pulse 91   Wt 254 lb (115.2 kg)   SpO2 97%   BMI 41.00 kg/m   Today's weight:  Last Weight  Most recent update: 12/05/2020  9:47 AM    Weight  115.2 kg (254 lb)            Review of prior weights: [employer]   12/05/20 0946  Weight: 254 lb (115.2 kg)    Physical Exam Constitutional:      General: She is not in acute distress.    Appearance: Normal appearance. She is not ill-appearing.  HENT:     Head: Normocephalic and atraumatic.  Cardiovascular:     Rate and Rhythm: Normal rate and regular rhythm.     Pulses: Normal pulses.     Heart sounds: Normal heart sounds.  Pulmonary:     Effort: Pulmonary effort is normal.     Breath sounds: Normal breath sounds.  Abdominal:     General: Abdomen is flat. Bowel sounds are normal.     Palpations: Abdomen is soft.  Skin:    General: Skin is warm and dry.     Coloration: Skin is not jaundiced.  Findings: No erythema or rash.  Neurological:     Mental Status: She is alert.     ASSESSMENT/PLAN:   DM (diabetes mellitus), type 2, uncontrolled (HCC) Currently taking Jardiance and 50 units basal insulin.  Last A1c was 10.7 this month.  Patient endorses new lifestyle modifications.  She has failed multiple medications including metformin and Victoza.  She is agreeable to trying Ozempic. -Patient educated on lifestyle modifications -We extensively discussed Ozempic including how to take it and foods to avoid -Start Ozempic 0.25 mg for 3 weeks,  increase to 0.5 mg for 3 weeks. -Follow-up in 4 to 6 weeks -Schedule diabetic eye exam  HYPERTENSION, BENIGN SYSTEMIC Currently controlled on hydrochlorothiazide and irbesartan.  -Recheck next visit -Continue medication regimen  Hyperlipidemia Currently taking atorvastatin. -Continue medication regimen  S/P amputation of foot, right (HCC) Closely followed by podiatry.  Her wound is healing appropriately.  Podiatry will be the one to clear her for work. -Continue podiatry visits       Tiffany Kocher, OMS IV Family Medicine Teaching Service  [facility]

## 2020-12-05 NOTE — Assessment & Plan Note (Signed)
Currently taking atorvastatin. -Continue medication regimen

## 2020-12-05 NOTE — Progress Notes (Signed)
Subjective:  Patient ID: Virginia [name], female    DOB: October 30, 1970,  MRN: [id number]  Chief Complaint  Patient presents with   Routine Post Op    DOS 9.4.22    DOS: 11/18/2020 Procedure: Right hallux amputation partial  50 y.o. female returns for post-op check.  Patient states she is doing well.  Mild soreness no acute complaints.  Ambulating with surgical shoe.  She has completed her antibiotics.  She is a diabetic with last A1c of 10.7.  Review of Systems: Negative except as noted in the HPI. Denies N/V/F/Ch.  Past Medical History:  Diagnosis Date   Diabetes mellitus    Hypertension    on HCTZ   Obesity    Wears glasses     Current Outpatient Medications:    acetaminophen (TYLENOL) 500 MG tablet, Take 1,000 mg by mouth every 6 (six) hours as needed for moderate pain or headache., Disp: , Rfl:    Alcohol Swabs (ALCOHOL PADS) 70 % PADS, 1 each by Does not apply route as directed., Disp: 90 each, Rfl: 3   atorvastatin (LIPITOR) 40 MG tablet, Take 1 tablet (40 mg total) by mouth daily., Disp: 90 tablet, Rfl: 3   Blood Glucose Monitoring Suppl (ONETOUCH VERIO) w/Device KIT, 1 kit by Does not apply route daily., Disp: 1 kit, Rfl: 1   empagliflozin (JARDIANCE) 25 MG TABS tablet, Take 1 tablet (25 mg total) by mouth daily., Disp: 90 tablet, Rfl: 3   gabapentin (NEURONTIN) 300 MG capsule, Take 2 capsules by mouth twice daily (Patient taking differently: Take 600 mg by mouth 2 (two) times daily.), Disp: 360 capsule, Rfl: 0   glucose blood (ONETOUCH VERIO) test strip, Use 3 times daily, Disp: 100 each, Rfl: 12   hydrochlorothiazide (HYDRODIURIL) 25 MG tablet, Take 1 tablet (25 mg total) by mouth daily., Disp: 90 tablet, Rfl: 3   ibuprofen (ADVIL) 200 MG tablet, Take 400 mg by mouth every 6 (six) hours as needed for headache or moderate pain., Disp: , Rfl:    Insulin Glargine (BASAGLAR KWIKPEN) 100 UNIT/ML, Inject 50 Units into the skin daily. (Patient taking differently: Inject 50 Units into  the skin every evening.), Disp: 15 mL, Rfl: 2   Insulin Pen Needle 31G X 5 MM MISC, Use one needle per application, Disp: 284 each, Rfl: 6   irbesartan (AVAPRO) 75 MG tablet, Take 1 tablet (75 mg total) by mouth daily., Disp: 90 tablet, Rfl: 3   Lancets (ONETOUCH ULTRASOFT) lancets, Please use to check blood sugar 3 times daily before meals. E11.65, Disp: 100 each, Rfl: 12   Multiple Vitamins-Minerals (MULTIVITAMIN WITH MINERALS) tablet, Take 1 tablet by mouth daily. Reported on 07/09/2015, Disp: , Rfl:    ONETOUCH DELICA LANCETS FINE MISC, 1 each by Does not apply route 2 (two) times daily., Disp: 100 each, Rfl: 5   Semaglutide,0.25 or 0.5MG/DOS, 2 MG/1.5ML SOPN, Inject 0.25 mg into the skin once a week. 0.25 mg once weekly for 4 weeks then increase to 0.5 mg weekly for at least 4 weeks,max 1 mg, Disp: 3 mL, Rfl: 3   Turmeric (QC TUMERIC COMPLEX PO), Take 2 tablets by mouth daily. gummy, Disp: , Rfl:   Social History   Tobacco Use  Smoking Status Former   Packs/day: 0.25   Years: 7.00   Pack years: 1.75   Types: Cigarettes   Start date: 03/18/1991   Quit date: 05/03/1998   Years since quitting: 22.6  Smokeless Tobacco Never  Tobacco Comments   Works  at [employer] does NOT smoke.     Allergies  Allergen Reactions   Liraglutide Nausea And Vomiting    Projectile Vomiting   Shrimp [Shellfish Allergy] Shortness Of Breath   Ace Inhibitors Cough   Metformin And Related Diarrhea and Nausea And Vomiting   Rosuvastatin Other (See Comments)    Myalgia - Legs reported - resolved upon discontinuation.    Objective:  There were no vitals filed for this visit. There is no height or weight on file to calculate BMI. Constitutional Well developed. Well nourished.  Vascular Foot warm and well perfused. Capillary refill normal to all digits.   Neurologic Normal speech. Oriented to person, place, and time. Epicritic sensation to light touch grossly present bilaterally.  Dermatologic Skin  completely epithelialized.  No signs of dehiscence or infection noted.  Orthopedic: I will be down located missed the meeting tenderness to palpation noted about the surgical site.   Radiographs: 3 views of skeletally mature the right foot: Partial hallux amputation noted.  No signs of bone infection noted.  No cortical destruction noted.  No soft tissue emphysema noted. Assessment:   1. History of amputation of great toe [facility])     Plan:  Patient was evaluated and treated and all questions answered.  S/p foot surgery right -Progressing as expected post-operatively. -XR: See above -WB Status: Transition to regular shoes without restriction -Sutures: Removed.  No clinical signs of dehiscence noted no complication noted. -Medications: None -Foot redressed.  No follow-ups on file.

## 2020-12-05 NOTE — Assessment & Plan Note (Signed)
Currently taking Jardiance and 50 units basal insulin.  Last A1c was 10.7 this month.  Patient endorses new lifestyle modifications.  She has failed multiple medications including metformin and Victoza.  She is agreeable to trying Ozempic. -Patient educated on lifestyle modifications -We extensively discussed Ozempic including how to take it and foods to avoid -Start Ozempic 0.25 mg for 3 weeks, increase to 0.5 mg for 3 weeks. -Follow-up in 4 to 6 weeks -Schedule diabetic eye exam

## 2020-12-05 NOTE — Assessment & Plan Note (Signed)
Currently controlled on hydrochlorothiazide and irbesartan.  -Recheck next visit -Continue medication regimen

## 2020-12-05 NOTE — Assessment & Plan Note (Signed)
Closely followed by podiatry.  Her wound is healing appropriately.  Podiatry will be the one to clear her for work. -Continue podiatry visits

## 2020-12-11 ENCOUNTER — Telehealth: Payer: Self-pay

## 2020-12-11 NOTE — Telephone Encounter (Signed)
Patient calls nurse line stating a PA is needed on Ozempic. I attempted on covermymeds, however the member was not found. I called her insurance company and was informed "no one matches ID in system." I was unable to speak with a human.   I contacted patient and informed her to contact her insurance company to obtain more information on her plan.   Patient agreeable and will call me back with details.

## 2020-12-12 ENCOUNTER — Other Ambulatory Visit: Payer: Self-pay

## 2020-12-12 ENCOUNTER — Ambulatory Visit (INDEPENDENT_AMBULATORY_CARE_PROVIDER_SITE_OTHER): Payer: 59 | Admitting: Podiatry

## 2020-12-12 ENCOUNTER — Telehealth: Payer: Self-pay | Admitting: *Deleted

## 2020-12-12 ENCOUNTER — Encounter: Payer: Self-pay | Admitting: Podiatry

## 2020-12-12 DIAGNOSIS — Z89419 Acquired absence of unspecified great toe: Secondary | ICD-10-CM

## 2020-12-12 DIAGNOSIS — L97512 Non-pressure chronic ulcer of other part of right foot with fat layer exposed: Secondary | ICD-10-CM

## 2020-12-12 MED ORDER — MUPIROCIN 2 % EX OINT: 1.0000 "application " | TOPICAL_OINTMENT | Freq: Every day | CUTANEOUS | 2 refills | Status: DC

## 2020-12-12 MED ORDER — DOXYCYCLINE HYCLATE 100 MG PO TABS: 100.0000 mg | ORAL_TABLET | Freq: Two times a day (BID) | ORAL | 0 refills | Status: AC

## 2020-12-12 NOTE — Telephone Encounter (Signed)
Patient is calling with concerns that her post surgery foot may be infected,is draining,open wound at the surgery site. She is requesting to be seen to have it evaluated.   Returned the call to patient and has scheduled /confirmed an appointment 12/12/20@2 :30.

## 2020-12-12 NOTE — Progress Notes (Signed)
  Subjective:  Patient ID: Virginia [name], female    DOB: 1970-12-28,  MRN: [id number]  Chief Complaint  Patient presents with   Post-op Problem      post op patient, wound opening at surgical site ,may be infected,draining    DOS: 11/18/20 Procedure: Right hallux amputation  50 y.o. female returns for post-op check.  There is a place on the outside of the incision that has opened and they were concerned about some of the drainage and swelling he was having  Review of Systems: Negative except as noted in the HPI. Denies N/V/F/Ch.   Objective:  There were no vitals filed for this visit. There is no height or weight on file to calculate BMI. Constitutional Well developed. Well nourished.  Vascular Foot warm and well perfused. Capillary refill normal to all digits.   Neurologic Normal speech. Oriented to person, place, and time. Epicritic sensation to light touch grossly reduced bilaterally.  Dermatologic Small area on lateral incision with delayed healing there is no deep dehiscence granular wound bed without cellulitis or purulence  Orthopedic: She has minimal to no tenderness to palpation noted about the surgical site.    Assessment:   1. History of amputation of great toe (HCC)   2. 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  Status post right hallux amputation -She has an area of small delayed healing.  They are concerned about the drainage but overall appears fairly benign.  We will put her on doxycycline again as a precaution and have her dressed daily with mupirocin ointment.  She will follow-up with Dr. Allena Katz in 2 weeks or sooner if she has issues No follow-ups on file.

## 2020-12-24 NOTE — Telephone Encounter (Signed)
Received fax from pharmacy regarding PA on Ozempic. Per Covermymeds, medication received favorable outcome.  Called pharmacy. Patient was able to pick medication up under insurance.   Veronda Prude, RN

## 2020-12-28 ENCOUNTER — Ambulatory Visit (INDEPENDENT_AMBULATORY_CARE_PROVIDER_SITE_OTHER): Payer: 59 | Admitting: Podiatry

## 2020-12-28 ENCOUNTER — Encounter: Payer: Self-pay | Admitting: Podiatry

## 2020-12-28 ENCOUNTER — Other Ambulatory Visit: Payer: Self-pay

## 2020-12-28 DIAGNOSIS — Z89419 Acquired absence of unspecified great toe: Secondary | ICD-10-CM

## 2021-01-02 NOTE — Progress Notes (Signed)
  Subjective:  Patient ID: Virginia [name], female    DOB: 1970-10-21,  MRN: [id number]  Chief Complaint  Patient presents with   Routine Post Virginia [name] 9.4.22    DOS: 11/18/20 Procedure: Right hallux amputation  50 y.o. female returns for post-op check.  There is a place on the outside of the incision that has opened and they were concerned about some of the drainage and swelling he was having  Review of Systems: Negative except as noted in the HPI. Denies N/V/F/Ch.   Objective:  There were no vitals filed for this visit. There is no height or weight on file to calculate BMI. Constitutional Well developed. Well nourished.  Vascular Foot warm and well perfused. Capillary refill normal to all digits.   Neurologic Normal speech. Oriented to person, place, and time. Epicritic sensation to light touch grossly reduced bilaterally.  Dermatologic No further dehiscence noted.  Skin has we epithelialized.  No acute complaints.  Orthopedic: She has minimal to no tenderness to palpation noted about the surgical site.    Assessment:   1. History of amputation of great toe [facility])     Plan:  Patient was evaluated and treated and all questions answered.  Status post right hallux amputation -Clinically healed and completely epithelialized.  At this time I discussed shoe gear modification and if any foot and ankle issues arise in future of asked her to come see me right away.  She states understanding No follow-ups on file.

## 2021-01-27 ENCOUNTER — Other Ambulatory Visit: Payer: Self-pay | Admitting: Family Medicine

## 2021-01-27 DIAGNOSIS — E1165 Type 2 diabetes mellitus with hyperglycemia: Secondary | ICD-10-CM

## 2021-02-04 ENCOUNTER — Other Ambulatory Visit: Payer: Self-pay

## 2021-02-04 ENCOUNTER — Ambulatory Visit (INDEPENDENT_AMBULATORY_CARE_PROVIDER_SITE_OTHER): Payer: 59

## 2021-02-04 ENCOUNTER — Ambulatory Visit (INDEPENDENT_AMBULATORY_CARE_PROVIDER_SITE_OTHER): Payer: 59 | Admitting: Family Medicine

## 2021-02-04 ENCOUNTER — Encounter: Payer: Self-pay | Admitting: Family Medicine

## 2021-02-04 VITALS — BP 140/81 | HR 97 | Wt 254.4 lb

## 2021-02-04 DIAGNOSIS — K5900 Constipation, unspecified: Secondary | ICD-10-CM

## 2021-02-04 DIAGNOSIS — I10 Essential (primary) hypertension: Secondary | ICD-10-CM | POA: Diagnosis not present

## 2021-02-04 DIAGNOSIS — E1165 Type 2 diabetes mellitus with hyperglycemia: Secondary | ICD-10-CM | POA: Diagnosis not present

## 2021-02-04 DIAGNOSIS — E782 Mixed hyperlipidemia: Secondary | ICD-10-CM | POA: Diagnosis not present

## 2021-02-04 DIAGNOSIS — Z23 Encounter for immunization: Secondary | ICD-10-CM | POA: Diagnosis not present

## 2021-02-04 DIAGNOSIS — Z1211 Encounter for screening for malignant neoplasm of colon: Secondary | ICD-10-CM

## 2021-02-04 DIAGNOSIS — Z89431 Acquired absence of right foot: Secondary | ICD-10-CM

## 2021-02-04 LAB — POCT GLYCOSYLATED HEMOGLOBIN (HGB A1C): HbA1c, POC (controlled diabetic range): 8.1 % — AB (ref 0.0–7.0)

## 2021-02-04 MED ORDER — POLYETHYLENE GLYCOL 3350 17 G PO PACK
17.0000 g | PACK | Freq: Every day | ORAL | 0 refills | Status: AC
Start: 2021-02-04 — End: ?

## 2021-02-04 NOTE — Assessment & Plan Note (Signed)
-   We will continue atorvastatin, checking lipid panel today

## 2021-02-04 NOTE — Progress Notes (Signed)
    SUBJECTIVE:   CHIEF COMPLAINT / HPI:   Type 2 diabetes mellitus-A1c today is 8.1, she has been taking semaglutide 0.5 mg weekly, this is her second week as well as Jardiance 25 mg.  Also takes Basaglar 45 to 50 units at night.  She denies any low sugars or hypoglycemic spells but notes when she does not eat a lot she drops her Basaglar to 45 units.  She has not noticed any abdominal pain or side effects from this.  She has not had an eye exam in the past year.  Hypertension-  she is taking her irbesartan 75 mg, and hydrochlorothiazide 25 mg without issue.  Denies headaches chest pain.  Hyperlipidemia-she has been taking her atorvastatin 40 mg daily, is willing to do lab work today.  No side effects from this.  Right big toe amputation-she follows with podiatry, sees them again tomorrow.  She has been checking her feet for other blisters or spots.  She does note some decrease sensation in her feet.  Her incision has healed well.  PERTINENT  PMH / PSH: T2DM, HTN, charcot foot, history of osteomyelitis s/p 1st toe amputation, HLD  OBJECTIVE:   BP 140/81   Pulse 97   Wt 254 lb 6.4 oz (115.4 kg)   SpO2 100%   BMI 41.06 kg/m   General: A&O, NAD HEENT: No sign of trauma, EOM grossly intact Cardiac: RRR, no m/r/g Respiratory: CTAB, normal WOB, no w/c/r GI: Soft, NTTP, non-distended  Extremities: NTTP, no peripheral edema.  Bilateral great toe amputations, incisions clean dry and well-healed. Neuro: Normal gait, moves all four extremities appropriately. Psych: Appropriate mood and affect   ASSESSMENT/PLAN:   DM (diabetes mellitus), type 2, uncontrolled (HCC) - A1c improved today on her GLP-1, she will continue the 0.5 mg for 2 more weeks and then can increase to the 1 mg weekly - Continue the Jardiance and Basaglar, discussed if she starts having low sugars to decrease her Basaglar by 5 units and give [place] a call, hopefully we can wean her down off her insulin as we increase her GLP-1  and get better control of her diabetes - Foot exam today showed decreased sensation, no lesions, ulcers, or injuries noted, she continues to follow with podiatry - Discussed getting eye exam.  HYPERTENSION, BENIGN SYSTEMIC - At goal today, continue home medications  Hyperlipidemia - We will continue atorvastatin, checking lipid panel today  S/P amputation of foot, right (HCC) - Healing well, no signs of infection or other foot lesions, continue to follow with podiatry   Discussed options for colon cancer screening, she elects to do Cologuard.  Message sent to lab to mail her Cologuard.  No family history of colon cancer. Discussed shingles vaccine, she will get at the pharmacy. COVID booster given today.  Billey Co, MD [facility]

## 2021-02-04 NOTE — Assessment & Plan Note (Signed)
-   Healing well, no signs of infection or other foot lesions, continue to follow with podiatry

## 2021-02-04 NOTE — Patient Instructions (Signed)
It was wonderful to see you today.  Please bring ALL of your medications with you to every visit.   Today we talked about:  - Your A1c looks great! Continue the Ozempic 0.5mg  weekly, in two more weeks can increase to 1 mg weekly - Miralax 1 cap daily start for constipation   Thank you for choosing  Family Medicine.   Please call [phone] with any questions about today's appointment.  Please be sure to schedule follow up at the front  desk before you leave today.   Burley Saver, MD  Family Medicine

## 2021-02-04 NOTE — Addendum Note (Signed)
Addended by: Burley Saver E on: 02/04/2021 05:01 PM   Modules accepted: Orders

## 2021-02-04 NOTE — Assessment & Plan Note (Signed)
-   A1c improved today on her GLP-1, she will continue the 0.5 mg for 2 more weeks and then can increase to the 1 mg weekly - Continue the Jardiance and Basaglar, discussed if she starts having low sugars to decrease her Basaglar by 5 units and give [place] a call, hopefully we can wean her down off her insulin as we increase her GLP-1 and get better control of her diabetes - Foot exam today showed decreased sensation, no lesions, ulcers, or injuries noted, she continues to follow with podiatry - Discussed getting eye exam.

## 2021-02-04 NOTE — Assessment & Plan Note (Signed)
-   At goal today, continue home medications

## 2021-02-06 ENCOUNTER — Other Ambulatory Visit: Payer: Self-pay

## 2021-02-06 ENCOUNTER — Encounter: Payer: Self-pay | Admitting: Podiatry

## 2021-02-06 ENCOUNTER — Ambulatory Visit (INDEPENDENT_AMBULATORY_CARE_PROVIDER_SITE_OTHER): Payer: 59 | Admitting: Podiatry

## 2021-02-06 ENCOUNTER — Ambulatory Visit: Payer: 59

## 2021-02-06 DIAGNOSIS — Z89419 Acquired absence of unspecified great toe: Secondary | ICD-10-CM

## 2021-02-06 DIAGNOSIS — Q666 Other congenital valgus deformities of feet: Secondary | ICD-10-CM

## 2021-02-06 DIAGNOSIS — M79675 Pain in left toe(s): Secondary | ICD-10-CM

## 2021-02-06 DIAGNOSIS — B351 Tinea unguium: Secondary | ICD-10-CM

## 2021-02-06 DIAGNOSIS — E1165 Type 2 diabetes mellitus with hyperglycemia: Secondary | ICD-10-CM

## 2021-02-06 DIAGNOSIS — M79674 Pain in right toe(s): Secondary | ICD-10-CM

## 2021-02-06 NOTE — Progress Notes (Signed)
SITUATION Reason for Consult: Evaluation for Bilateral Custom Foot Orthoses Patient / Caregiver Report: Patient has a history of foot orthotics but does not want to have diabetic shoes  OBJECTIVE DATA: Patient History / Diagnosis: Pes planovalgus, history of amputation Current or Previous Devices: Custom functional foot orthotics  Foot Examination: Skin presentation:   Intact Ulcers & Callousing:   None and no history Toe / Foot Deformities:  Hallux amputations bilateral Weight Bearing Presentation:  Planus Sensation:    Intact  ORTHOTIC RECOMMENDATION Recommended Device: 1x pair of custom functional foot orthotics  GOALS OF ORTHOSES - Reduce Pain - Prevent Foot Deformity - Prevent Progression of Further Foot Deformity - Relieve Pressure - Improve the Overall Biomechanical Function of the Foot and Lower Extremity.  ACTIONS PERFORMED Patient was casted for Foot Orthoses via crush box. Procedure was explained and patient tolerated procedure well. All questions were answered and concerns addressed.  PLAN Potential out of pocket cost was communicated to patient. Casts are to be sent to [facility] for fabrication. Patient is to be called for fitting when devices are ready.

## 2021-02-06 NOTE — Progress Notes (Addendum)
  Subjective:  Patient ID: Virginia [name], female    DOB: 01-09-1971,  MRN: [id number]  Chief Complaint  Patient presents with   Routine Post Op    4 Wk follow up  PT stated that she is doing well she does have some tenderness    50 y.o. female returns for the above complaint.  Patient presents with thickened elongated dystrophic toenails x8.  Patient has a history of bilateral hallux toe amputation.  She states that there is little bit of tenderness overall healing well.  She would like to have the nails debrided down.  She denies any other acute complaints.  She is not able to do it herself.  She also has secondary complaint of obtaining inserts orthotics due to history of ulceration.  She states her current insoles and orthotics are worn out.  She would like to do another  Possible.  Objective:  There were no vitals filed for this visit. Podiatric Exam: Vascular: dorsalis pedis and posterior tibial pulses are palpable bilateral. Capillary return is immediate. Temperature gradient is WNL. Skin turgor WNL  Sensorium: Decreased Semmes Weinstein monofilament test.  Decreased actile sensation bilaterally. Nail Exam: Pt has thick disfigured discolored nails with subungual debris noted bilateral entire nail second through fifth toenails. Pain on palpation to the nails.  X8 Ulcer Exam: There is no evidence of ulcer or pre-ulcerative changes or infection. Orthopedic Exam: Muscle tone and strength are WNL. No limitations in general ROM. No crepitus or effusions noted.  Skin: No Porokeratosis. No infection or ulcers    Assessment & Plan:   1. History of amputation of great toe (HCC)   2. Pes planovalgus   3. Uncontrolled type 2 diabetes mellitus with hyperglycemia (HCC)   4. Pain due to onychomycosis of toenails of both feet     Patient was evaluated and treated and all questions answered.  History of great toe amputation bilateral with underlying pes planovalgus in the setting of diabetes -I  explained the patient these history of great toe potation various treatment options were discussed.  Given his setting of diabetes patient is a high risk of developing further ulceration if not properly offloaded.  I believe she would benefit from custom-made orthotics with a toe filler to bilateral great toe.  Onychomycosis with pain  -Nails palliatively debrided as below. -Educated on self-care  Procedure: Nail Debridement Rationale: pain  Type of Debridement: manual, sharp debridement. Instrumentation: Nail nipper, rotary burr. Number of Nails: 8  Procedures and Treatment: Consent by patient was obtained for treatment procedures. The patient understood the discussion of treatment and procedures well. All questions were answered thoroughly reviewed. Debridement of mycotic and hypertrophic toenails, 1 through 5 bilateral and clearing of subungual debris. No ulceration, no infection noted.  Return Visit-Office Procedure: Patient instructed to return to the office for a follow up visit 3 months for continued evaluation and treatment.  Nicholes Rough, DPM    No follow-ups on file.

## 2021-02-27 ENCOUNTER — Other Ambulatory Visit: Payer: Self-pay | Admitting: Family Medicine

## 2021-02-27 DIAGNOSIS — E1165 Type 2 diabetes mellitus with hyperglycemia: Secondary | ICD-10-CM

## 2021-03-04 ENCOUNTER — Telehealth: Payer: Self-pay | Admitting: Podiatry

## 2021-03-04 NOTE — Telephone Encounter (Signed)
Orthotics in.. lvm for pt to call to schedule an appt to pick them up. °

## 2021-03-06 ENCOUNTER — Ambulatory Visit (INDEPENDENT_AMBULATORY_CARE_PROVIDER_SITE_OTHER): Payer: 59

## 2021-03-06 ENCOUNTER — Other Ambulatory Visit: Payer: Self-pay

## 2021-03-06 DIAGNOSIS — Q666 Other congenital valgus deformities of feet: Secondary | ICD-10-CM

## 2021-03-06 NOTE — Progress Notes (Signed)
SITUATION: °Reason for Visit: Fitting and Delivery of Custom Fabricated Foot Orthoses °Patient Report: Patient reports comfort and is satisfied with device. ° °OBJECTIVE DATA: °Patient History / Diagnosis:  No change in pathology °Provided Device:  Functional foot orthotics ° °GOAL OF ORTHOSIS °- Improve gait °- Decrease energy expenditure °- Improve Balance °- Provide Triplanar stability of foot complex °- Facilitate motion ° °ACTIONS PERFORMED °Patient was fit with foot orthotics trimmed to shoe last. Patient tolerated fittign procedure. Device was modified as follows to better fit patient: °- Toe plate was trimmed to shoe last ° °Patient was provided with verbal and written instruction and demonstration regarding donning, doffing, wear, care, proper fit, function, purpose, cleaning, and use of the orthosis and in all related precautions and risks and benefits regarding the orthosis. ° °Patient was also provided with verbal instruction regarding how to report any failures or malfunctions of the orthosis and necessary follow up care. Patient was also instructed to contact our office regarding any change in status that may affect the function of the orthosis. ° °Patient demonstrated independence with proper donning, doffing, and fit and verbalized understanding of all instructions. ° °PLAN: °Patient is to follow up in one week or as necessary (PRN). All questions were answered and concerns addressed. Plan of care was discussed with and agreed upon by the patient. ° °

## 2021-03-20 ENCOUNTER — Ambulatory Visit: Payer: 59 | Admitting: Podiatry

## 2021-03-28 ENCOUNTER — Telehealth: Payer: Self-pay | Admitting: Podiatry

## 2021-03-28 NOTE — Telephone Encounter (Signed)
Refurbished orthotics in.. pt aware ok to pick up. 

## 2021-04-03 ENCOUNTER — Other Ambulatory Visit: Payer: Self-pay | Admitting: Family Medicine

## 2021-04-03 DIAGNOSIS — E1165 Type 2 diabetes mellitus with hyperglycemia: Secondary | ICD-10-CM

## 2021-04-13 ENCOUNTER — Other Ambulatory Visit: Payer: Self-pay | Admitting: Family Medicine

## 2021-04-21 LAB — COLOGUARD: COLOGUARD: NEGATIVE

## 2021-04-24 ENCOUNTER — Telehealth: Payer: Self-pay | Admitting: Podiatry

## 2021-04-24 NOTE — Telephone Encounter (Signed)
Patient would like to have her intermittent FMLA extended. She would like for it to  begin 04/24/2021. She was out of work on 2/8, due to foot pain. Her last visit was 02/06/21, Please advise?

## 2021-05-02 ENCOUNTER — Other Ambulatory Visit: Payer: Self-pay

## 2021-05-02 DIAGNOSIS — I10 Essential (primary) hypertension: Secondary | ICD-10-CM

## 2021-05-02 DIAGNOSIS — E1165 Type 2 diabetes mellitus with hyperglycemia: Secondary | ICD-10-CM

## 2021-05-02 MED ORDER — IRBESARTAN 75 MG PO TABS: 75.0000 mg | ORAL_TABLET | Freq: Every day | ORAL | 3 refills | Status: DC

## 2021-05-09 ENCOUNTER — Other Ambulatory Visit (HOSPITAL_COMMUNITY): Payer: Self-pay | Admitting: Interventional Radiology

## 2021-05-09 DIAGNOSIS — I739 Peripheral vascular disease, unspecified: Secondary | ICD-10-CM

## 2021-05-10 ENCOUNTER — Ambulatory Visit: Payer: 59 | Admitting: Podiatry

## 2021-05-17 ENCOUNTER — Other Ambulatory Visit: Payer: Self-pay

## 2021-05-17 ENCOUNTER — Ambulatory Visit: Payer: 59 | Admitting: Podiatry

## 2021-05-17 DIAGNOSIS — B351 Tinea unguium: Secondary | ICD-10-CM

## 2021-05-17 DIAGNOSIS — M79674 Pain in right toe(s): Secondary | ICD-10-CM | POA: Diagnosis not present

## 2021-05-17 DIAGNOSIS — E1165 Type 2 diabetes mellitus with hyperglycemia: Secondary | ICD-10-CM

## 2021-05-17 DIAGNOSIS — Z89419 Acquired absence of unspecified great toe: Secondary | ICD-10-CM

## 2021-05-17 DIAGNOSIS — M79675 Pain in left toe(s): Secondary | ICD-10-CM

## 2021-05-18 ENCOUNTER — Other Ambulatory Visit: Payer: Self-pay | Admitting: Family Medicine

## 2021-05-18 DIAGNOSIS — E1165 Type 2 diabetes mellitus with hyperglycemia: Secondary | ICD-10-CM

## 2021-05-22 ENCOUNTER — Encounter: Payer: Self-pay | Admitting: Podiatry

## 2021-05-22 NOTE — Progress Notes (Signed)
?  Subjective:  ?Patient ID: Virginia [name], female    DOB: [birth year],  MRN: [id number] ? ?Chief Complaint  ?Patient presents with  ? Diabetes  ?  Pt stated that she is doing well   ? ?51 y.o. female returns for the above complaint.  Patient presents with thickened elongated dystrophic toenails x8.  Patient has a history of bilateral hallux toe amputation.  She states that there is little bit of tenderness overall healing well.  She would like to have the nails debrided down.  She denies any other acute complaints.  She is not able to do it herself.  She also has secondary complaint of obtaining inserts orthotics due to history of ulceration.  She states her current insoles and orthotics are worn out.  She would like to do another ? ?Possible. ? ?Objective:  ?There were no vitals filed for this visit. ?Podiatric Exam: ?Vascular: dorsalis pedis and posterior tibial pulses are palpable bilateral. Capillary return is immediate. Temperature gradient is WNL. Skin turgor WNL  ?Sensorium: Decreased Semmes Weinstein monofilament test.  Decreased actile sensation bilaterally. ?Nail Exam: Pt has thick disfigured discolored nails with subungual debris noted bilateral entire nail second through fifth toenails. Pain on palpation to the nails.  X8 ?Ulcer Exam: There is no evidence of ulcer or pre-ulcerative changes or infection. ?Orthopedic Exam: Muscle tone and strength are WNL. No limitations in general ROM. No crepitus or effusions noted.  ?Skin: No Porokeratosis. No infection or ulcers ? ? ? ?Assessment & Plan:  ? ?1. History of amputation of great toe (HCC)   ?2. Uncontrolled type 2 diabetes mellitus with hyperglycemia (HCC)   ?3. Pain due to onychomycosis of toenails of both feet   ? ? ?Patient was evaluated and treated and all questions answered. ? ?History of great toe amputation bilateral with underlying pes planovalgus in the setting of diabetes ?-I explained the patient these history of great toe potation various treatment  options were discussed.  Given his setting of diabetes patient is a high risk of developing further ulceration if not properly offloaded.  I believe she would benefit from custom-made orthotics with a toe filler to bilateral great toe. ?-She would also like for me to place a referral for vascular evaluation and input if needed. ? ?Onychomycosis with pain  ?-Nails palliatively debrided as below. ?-Educated on self-care ? ?Procedure: Nail Debridement ?Rationale: pain  ?Type of Debridement: manual, sharp debridement. ?Instrumentation: Nail nipper, rotary burr. ?Number of Nails: 8 ? ?Procedures and Treatment: Consent by patient was obtained for treatment procedures. The patient understood the discussion of treatment and procedures well. All questions were answered thoroughly reviewed. Debridement of mycotic and hypertrophic toenails, 1 through 5 bilateral and clearing of subungual debris. No ulceration, no infection noted.  ?Return Visit-Office Procedure: Patient instructed to return to the office for a follow up visit 3 months for continued evaluation and treatment. ? ?Nicholes Rough, DPM ?  ? ?No follow-ups on file. ? ? ?

## 2021-06-04 ENCOUNTER — Encounter: Payer: Self-pay | Admitting: Family Medicine

## 2021-06-04 ENCOUNTER — Other Ambulatory Visit: Payer: Self-pay

## 2021-06-04 ENCOUNTER — Ambulatory Visit: Payer: 59 | Admitting: Family Medicine

## 2021-06-04 VITALS — BP 128/80 | HR 94 | Ht 66.0 in | Wt 251.2 lb

## 2021-06-04 DIAGNOSIS — E11649 Type 2 diabetes mellitus with hypoglycemia without coma: Secondary | ICD-10-CM

## 2021-06-04 DIAGNOSIS — E782 Mixed hyperlipidemia: Secondary | ICD-10-CM

## 2021-06-04 DIAGNOSIS — I10 Essential (primary) hypertension: Secondary | ICD-10-CM

## 2021-06-04 DIAGNOSIS — E1165 Type 2 diabetes mellitus with hyperglycemia: Secondary | ICD-10-CM | POA: Diagnosis not present

## 2021-06-04 LAB — POCT GLYCOSYLATED HEMOGLOBIN (HGB A1C): HbA1c, POC (controlled diabetic range): 7.7 % — AB (ref 0.0–7.0)

## 2021-06-04 MED ORDER — ATORVASTATIN CALCIUM 40 MG PO TABS: 40.0000 mg | ORAL_TABLET | Freq: Every day | ORAL | 3 refills | Status: DC

## 2021-06-04 MED ORDER — SEMAGLUTIDE (1 MG/DOSE) 4 MG/3ML ~~LOC~~ SOPN: 1.0000 mg | PEN_INJECTOR | SUBCUTANEOUS | 0 refills | Status: DC

## 2021-06-04 MED ORDER — IRBESARTAN 75 MG PO TABS: 75.0000 mg | ORAL_TABLET | Freq: Every day | ORAL | 3 refills | Status: DC

## 2021-06-04 MED ORDER — EMPAGLIFLOZIN 25 MG PO TABS: 25.0000 mg | ORAL_TABLET | Freq: Every day | ORAL | 3 refills | Status: DC

## 2021-06-04 NOTE — Progress Notes (Signed)
? ? ?  SUBJECTIVE:  ? ?CHIEF COMPLAINT / HPI:  ? ?Diabetes ?Current Regimen: Jardiance 25 mg daily, Basaglar 40 units, semaglutide 0.5 mg weekly  ?Last A1c: 8.1 on 02/04/21  ?Denies polyuria, polydipsia, hypoglycemia  ?Last Eye Exam: needs schedule ?Statin: lipitor 40 mg daily ?ACE/ARB: irbesartan 75 mg daily ? ?Hypertension ?- Medications: HCTZ 25 mg daily, irbesartan 75 mg daily ?- Compliance: yes ?- Checking BP at home: unknown ?- Denies any SOB, CP, vision changes, LE edema, medication SEs, or symptoms of hypotension ? ?PERTINENT  PMH / PSH: s/p right foot amputation, hx of osteomyelitis ? ?OBJECTIVE:  ? ?BP 128/80   Pulse 94   Ht 5\' 6"  (1.676 m)   Wt 251 lb 3.2 oz (113.9 kg)   SpO2 100%   BMI 40.54 kg/m?   ?Results for orders placed or performed in visit on 06/04/21 (from the past 24 hour(s))  ?HgB A1c     Status: Abnormal  ? Collection Time: 06/04/21  2:25 PM  ?Result Value Ref Range  ? Hemoglobin A1C    ? HbA1c POC (<> result, manual entry)    ? HbA1c, POC (prediabetic range)    ? HbA1c, POC (controlled diabetic range) 7.7 (A) 0.0 - 7.0 %  ? ? ?Physical Exam ?Vitals reviewed.  ?Constitutional:   ?   General: She is not in acute distress. ?   Appearance: She is not ill-appearing.  ?Cardiovascular:  ?   Rate and Rhythm: Normal rate and regular rhythm.  ?   Heart sounds: Normal heart sounds.  ?Pulmonary:  ?   Effort: Pulmonary effort is normal.  ?   Breath sounds: Normal breath sounds.  ?Neurological:  ?   Mental Status: She is alert and oriented to person, place, and time.  ?Psychiatric:     ?   Mood and Affect: Mood normal.     ?   Behavior: Behavior normal.  ? ?ASSESSMENT/PLAN:  ? ?Uncontrolled type 2 diabetes mellitus with hyperglycemia (HCC) ?Improving. Goal of <7. Increased semaglutide to 1 mg weekly (0.5mg  was well tolerated). Reviewed recent podiatry note. Plan to follow up in 3 months and consider decreasing insulin further. Do foot exam at follow up.  ? ?HYPERTENSION, BENIGN SYSTEMIC ?Well  controlled. Continue current medication. ? ?06/06/21, DO ?[facility]  ?

## 2021-06-04 NOTE — Patient Instructions (Addendum)
It was wonderful to see you today. ? ?Please bring ALL of your medications with you to every visit.  ? ?Today we talked about: ? ?Increasing semaglutide to 1 mg weekly.  Continue 40 units of Basaglar. ?Continue current medications for high blood pressure. ?Follow-up in 3 months for repeat A1c and we will consider further decreasing Basaglar. ? ?Please be sure to schedule follow up at the front  desk before you leave today.  ? ?If you haven't already, sign up for My Chart to have easy access to your labs results, and communication with your primary care physician. ? ?Please call the clinic at [phone] if your symptoms worsen or you have any concerns. It was our pleasure to serve you. ? ?Dr. Janus Molder ? ?

## 2021-06-11 DIAGNOSIS — M79676 Pain in unspecified toe(s): Secondary | ICD-10-CM

## 2021-07-07 ENCOUNTER — Other Ambulatory Visit: Payer: Self-pay | Admitting: Family Medicine

## 2021-07-07 DIAGNOSIS — E1165 Type 2 diabetes mellitus with hyperglycemia: Secondary | ICD-10-CM

## 2021-08-09 ENCOUNTER — Other Ambulatory Visit: Payer: Self-pay | Admitting: *Deleted

## 2021-08-09 DIAGNOSIS — M25569 Pain in unspecified knee: Secondary | ICD-10-CM

## 2021-08-16 ENCOUNTER — Ambulatory Visit: Payer: 59 | Admitting: Podiatry

## 2021-08-16 DIAGNOSIS — E1165 Type 2 diabetes mellitus with hyperglycemia: Secondary | ICD-10-CM

## 2021-08-16 DIAGNOSIS — B351 Tinea unguium: Secondary | ICD-10-CM | POA: Diagnosis not present

## 2021-08-16 DIAGNOSIS — M79674 Pain in right toe(s): Secondary | ICD-10-CM

## 2021-08-16 DIAGNOSIS — M79675 Pain in left toe(s): Secondary | ICD-10-CM

## 2021-08-20 ENCOUNTER — Encounter: Payer: Self-pay | Admitting: *Deleted

## 2021-08-21 ENCOUNTER — Ambulatory Visit (HOSPITAL_COMMUNITY)
Admission: RE | Admit: 2021-08-21 | Discharge: 2021-08-21 | Disposition: A | Discharge status: Home / Self Care | Payer: 59 | Source: Ambulatory Visit | Attending: Vascular Surgery | Admitting: Vascular Surgery

## 2021-08-21 ENCOUNTER — Encounter: Payer: Self-pay | Admitting: Vascular Surgery

## 2021-08-21 ENCOUNTER — Ambulatory Visit: Payer: 59 | Admitting: Vascular Surgery

## 2021-08-21 VITALS — BP 163/91 | HR 94 | Temp 98.3°F | Resp 20 | Ht 66.0 in | Wt 250.0 lb

## 2021-08-21 DIAGNOSIS — M25569 Pain in unspecified knee: Secondary | ICD-10-CM

## 2021-08-21 NOTE — Progress Notes (Signed)
Patient ID: Virginia [name], female   DOB: 1970-10-19, 51 y.o.   MRN: [id number]  Reason for Consult: New Patient (Initial Visit)   Referred by Gerlene Fee, DO  Subjective:     HPI:  Virginia [name] is a 51 y.o. female without significant vascular history has history of diabetes and bilateral great toe amputations.  She does walk but with flattening of the left foot.  No tissue loss or ulceration.  No history of stroke, TIA or amaurosis.  No history of aneurysm disease.  She is here today for evaluation with ABIs.  Past Medical History:  Diagnosis Date   Diabetes mellitus    Hypertension    on HCTZ   Obesity    Wears glasses    Family History  Problem Relation Age of Onset   Diabetes Mother    Heart disease Mother    Hypertension Mother    Hyperlipidemia Mother    Cancer Mother    Breast cancer Mother    Diabetes Father    Heart disease Father    Hypertension Father    Hyperlipidemia Father    Breast cancer Maternal Aunt    Past Surgical History:  Procedure Laterality Date   AMPUTATION Right 11/18/2020   Procedure: AMPUTATION HALLUX TOE;  Surgeon: Felipa Furnace, DPM;  Location: [facility];  Service: Podiatry;  Laterality: Right;   CHOLECYSTECTOMY     IR RADIOLOGIST EVAL & MGMT  04/19/2019   SHOULDER ARTHROSCOPY WITH ROTATOR CUFF REPAIR AND SUBACROMIAL DECOMPRESSION Left 06/03/2013   Procedure: LEFT SHOULDER ARTHROSCOPY DEBRIDEMENT EXTENTSIVE/SUBACROMIAL DECOMPRESSION/PARTIAL ACROMIOPLASTY DISTAL CLAVICLE RESECTION WITH CORACOACROMIAL RELEASE/ ROTATOR CUFF REPAIR ;  Surgeon: Johnny Bridge, MD;  Location: [facility];  Service: Orthopedics;  Laterality: Left;  ANESTHESIA: GENERAL, PRE/POST OP SCALENE   TUBAL LIGATION      Short Social History:  Social History   Tobacco Use   Smoking status: Former    Packs/day: 0.25    Years: 7.00    Pack years: 1.75    Types: Cigarettes    Start date: 03/18/1991    Quit date: 05/03/1998    Years since quitting: 23.3    Smokeless tobacco: Never   Tobacco comments:    Works at [employer] does NOT smoke.   Substance Use Topics   Alcohol use: No    Comment: rare    Allergies  Allergen Reactions   Liraglutide Nausea And Vomiting    Projectile Vomiting   Shrimp [Shellfish Allergy] Shortness Of Breath   Ace Inhibitors Cough   Metformin And Related Diarrhea and Nausea And Vomiting   Rosuvastatin Other (See Comments)    Myalgia - Legs reported - resolved upon discontinuation.     Current Outpatient Medications  Medication Sig Dispense Refill   acetaminophen (TYLENOL) 500 MG tablet Take 1,000 mg by mouth every 6 (six) hours as needed for moderate pain or headache.     atorvastatin (LIPITOR) 40 MG tablet Take 1 tablet (40 mg total) by mouth daily. 90 tablet 3   Blood Glucose Monitoring Suppl (ONETOUCH VERIO) w/Device KIT 1 kit by Does not apply route daily. 1 kit 1   empagliflozin (JARDIANCE) 25 MG TABS tablet Take 1 tablet (25 mg total) by mouth daily. 90 tablet 3   gabapentin (NEURONTIN) 300 MG capsule Take 2 capsules (600 mg total) by mouth 2 (two) times daily. 360 capsule 0   glucose blood (ONETOUCH VERIO) test strip Use 3 times daily 100 each 12  hydrochlorothiazide (HYDRODIURIL) 25 MG tablet Take 1 tablet (25 mg total) by mouth daily. 90 tablet 3   ibuprofen (ADVIL) 200 MG tablet Take 400 mg by mouth every 6 (six) hours as needed for headache or moderate pain.     Insulin Glargine (BASAGLAR KWIKPEN) 100 UNIT/ML INJECT 50 UNITS SUBCUTANEOUSLY ONCE DAILY 15 mL 0   Insulin Pen Needle 31G X 5 MM MISC Use one needle per application 759 each 6   irbesartan (AVAPRO) 75 MG tablet Take 1 tablet (75 mg total) by mouth daily. 90 tablet 3   Lancets (ONETOUCH ULTRASOFT) lancets Please use to check blood sugar 3 times daily before meals. E11.65 100 each 12   Multiple Vitamins-Minerals (MULTIVITAMIN WITH MINERALS) tablet Take 1 tablet by mouth daily. Reported on 1/63/8466     [facility] DELICA LANCETS FINE MISC 1  each by Does not apply route 2 (two) times daily. 100 each 5   polyethylene glycol (MIRALAX) 17 g packet Take 17 g by mouth daily. 14 each 0   Semaglutide, 1 MG/DOSE, 4 MG/3ML SOPN Inject 1 mg into the skin once a week. 12 mL 0   Turmeric (QC TUMERIC COMPLEX PO) Take 2 tablets by mouth daily. gummy     No current facility-administered medications for this visit.    Review of Systems  Constitutional:  Constitutional negative. HENT: HENT negative.  Eyes: Eyes negative.  Respiratory: Respiratory negative.  Cardiovascular: Cardiovascular negative.  GI: Gastrointestinal negative.  Musculoskeletal: Positive for gait problem.  Skin: Skin negative.  Hematologic: Hematologic/lymphatic negative.  Psychiatric: Psychiatric negative.       Objective:  Objective   Vitals:   08/21/21 1034  BP: (!) 163/91  Pulse: 94  Resp: 20  Temp: 98.3 F (36.8 C)  SpO2: 96%  Weight: 250 lb (113.4 kg)  Height: '5\' 6"'  (1.676 m)   Body mass index is 40.35 kg/m.  Physical Exam HENT:     Head: Normocephalic.     Nose: Nose normal.  Eyes:     Pupils: Pupils are equal, round, and reactive to light.  Neck:     Vascular: No carotid bruit.  Cardiovascular:     Rate and Rhythm: Normal rate.     Pulses:          Radial pulses are 2+ on the right side and 2+ on the left side.       Popliteal pulses are 2+ on the right side and 2+ on the left side.       Dorsalis pedis pulses are 2+ on the right side and 2+ on the left side.  Pulmonary:     Effort: Pulmonary effort is normal.  Abdominal:     General: Abdomen is flat.  Musculoskeletal:     Comments: Well-healed bilateral great toe amputations  Skin:    General: Skin is warm and dry.     Capillary Refill: Capillary refill takes less than 2 seconds.  Neurological:     General: No focal deficit present.     Mental Status: She is alert.    Data: ABI Findings:  +---------+------------------+-----+---------+----------+  Right    Rt Pressure  (mmHg)IndexWaveform Comment     +---------+------------------+-----+---------+----------+  Brachial 178                                         +---------+------------------+-----+---------+----------+  PTA      255  1.42 triphasic            +---------+------------------+-----+---------+----------+  DP       253               1.41 triphasic            +---------+------------------+-----+---------+----------+  Great Toe                                amputation  +---------+------------------+-----+---------+----------+   +---------+------------------+-----+---------+----------+  Left     Lt Pressure (mmHg)IndexWaveform Comment     +---------+------------------+-----+---------+----------+  Brachial 179                                         +---------+------------------+-----+---------+----------+  PTA      255               1.42 triphasic            +---------+------------------+-----+---------+----------+  DP       253               1.41 triphasic            +---------+------------------+-----+---------+----------+  Great Toe                                amputation  +---------+------------------+-----+---------+----------+   +-------+-----------+-----------+------------+------------+  ABI/TBIToday's ABIToday's TBIPrevious ABIPrevious TBI  +-------+-----------+-----------+------------+------------+  Right  [place]         amp        [place]                        +-------+-----------+-----------+------------+------------+  Left   [place]         amp        [place]          amp           +-------+-----------+-----------+------------+------------+     Summary:  Right: Resting right ankle-brachial index indicates noncompressible right  lower extremity arteries.   Left: Resting left ankle-brachial index indicates noncompressible left  lower extremity arteries.      Assessment/Plan:    51 year old female here  with previous bilateral great toe amputations with palpable pulses today.  Does not appear to have any vascular disease at that time.  She will continue on aspirin given her risk factors and will follow-up with me on an as-needed basis.    Waynetta Sandy MD [facility]

## 2021-08-21 NOTE — Progress Notes (Signed)
  Subjective:  Patient ID: Virginia [name], female    DOB: 04-24-1970,  MRN: [id number]  Chief Complaint  Patient presents with   Diabetes    3 month follow up  Pt stated that she is doing well no new concerns    51 y.o. female returns for the above complaint.  Patient presents with thickened elongated dystrophic toenails x8.  She states she is not able to do it herself.  She would like for me to do it.  She denies any other acute complaints.  Orthotics are functioning fine.  Possible.  Objective:  There were no vitals filed for this visit. Podiatric Exam: Vascular: dorsalis pedis and posterior tibial pulses are palpable bilateral. Capillary return is immediate. Temperature gradient is WNL. Skin turgor WNL  Sensorium: Decreased Semmes Weinstein monofilament test.  Decreased actile sensation bilaterally. Nail Exam: Pt has thick disfigured discolored nails with subungual debris noted bilateral entire nail second through fifth toenails. Pain on palpation to the nails.  X8 Ulcer Exam: There is no evidence of ulcer or pre-ulcerative changes or infection. Orthopedic Exam: Muscle tone and strength are WNL. No limitations in general ROM. No crepitus or effusions noted.  Skin: No Porokeratosis. No infection or ulcers    Assessment & Plan:   No diagnosis found.   Patient was evaluated and treated and all questions answered.  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  She is benefiting from orthotics.  Onychomycosis with pain  -Nails palliatively debrided as below. -Educated on self-care  Procedure: Nail Debridement Rationale: pain  Type of Debridement: manual, sharp debridement. Instrumentation: Nail nipper, rotary burr. Number of Nails: 8  Procedures and Treatment: Consent by patient was obtained for treatment procedures. The patient understood the discussion of  treatment and procedures well. All questions were answered thoroughly reviewed. Debridement of mycotic and hypertrophic toenails, 1 through 5 bilateral and clearing of subungual debris. No ulceration, no infection noted.  Return Visit-Office Procedure: Patient instructed to return to the office for a follow up visit 3 months for continued evaluation and treatment.  Nicholes Rough, DPM    No follow-ups on file.

## 2021-09-03 ENCOUNTER — Other Ambulatory Visit: Payer: Self-pay | Admitting: Family Medicine

## 2021-09-03 DIAGNOSIS — E1165 Type 2 diabetes mellitus with hyperglycemia: Secondary | ICD-10-CM

## 2021-09-11 ENCOUNTER — Other Ambulatory Visit: Payer: Self-pay | Admitting: Family Medicine

## 2021-09-27 ENCOUNTER — Other Ambulatory Visit: Payer: Self-pay | Admitting: Family Medicine

## 2021-09-27 DIAGNOSIS — E1165 Type 2 diabetes mellitus with hyperglycemia: Secondary | ICD-10-CM

## 2021-10-02 ENCOUNTER — Other Ambulatory Visit: Payer: Self-pay | Admitting: Family Medicine

## 2021-10-02 DIAGNOSIS — E1165 Type 2 diabetes mellitus with hyperglycemia: Secondary | ICD-10-CM

## 2021-11-15 ENCOUNTER — Other Ambulatory Visit: Payer: Self-pay | Admitting: Student

## 2021-11-15 DIAGNOSIS — E1165 Type 2 diabetes mellitus with hyperglycemia: Secondary | ICD-10-CM

## 2021-11-21 ENCOUNTER — Ambulatory Visit: Payer: 59 | Admitting: Family Medicine

## 2021-11-21 VITALS — BP 132/74 | HR 92

## 2021-11-21 DIAGNOSIS — N3001 Acute cystitis with hematuria: Secondary | ICD-10-CM | POA: Diagnosis not present

## 2021-11-21 DIAGNOSIS — R300 Dysuria: Secondary | ICD-10-CM

## 2021-11-21 LAB — POCT UA - MICROSCOPIC ONLY: WBC, Ur, HPF, POC: >20 (ref 0–5)

## 2021-11-21 LAB — POCT URINALYSIS DIP (MANUAL ENTRY)
Bilirubin, UA: NEGATIVE
Glucose, UA: >=1000 mg/dL — AB
Ketones, POC UA: NEGATIVE mg/dL
Nitrite, UA: POSITIVE — AB
Protein Ur, POC: 30 mg/dL — AB
Spec Grav, UA: 1.02 (ref 1.010–1.025)
Urobilinogen, UA: 0.2 E.U./dL
pH, UA: 6 (ref 5.0–8.0)

## 2021-11-21 MED ORDER — NITROFURANTOIN MONOHYD MACRO 100 MG PO CAPS: 100.0000 mg | ORAL_CAPSULE | Freq: Two times a day (BID) | ORAL | 0 refills | Status: AC

## 2021-11-21 NOTE — Patient Instructions (Signed)
You have a UTI.  I will send antibiotics to your pharmacy. Take them until it's completely gone.  Come back if your symptoms don't resolve or if you develop new concerns (fever, vomiting, back pain)  Take care, Dr Anner Crete

## 2021-11-21 NOTE — Progress Notes (Signed)
    SUBJECTIVE:   CHIEF COMPLAINT / HPI:   Dysuria -Noticed cloudy urine 5 days ago -Yesterday developed dysuria, urgency and frequency -Some blood in her urine today -No abdominal pain, nausea, vomiting, back pain, or fever -Some vaginal itching but no discharge -No prior history of UTIs -Married but not sexually active, declines STI testing -Perimenopausal- last period was last month  PERTINENT  PMH / PSH: T2DM, HTN  OBJECTIVE:   BP 132/74   Pulse 92   SpO2 98%   General: NAD, pleasant, able to participate in exam Respiratory: No respiratory distress Skin: warm and dry, no rashes noted Psych: Normal affect and mood Neuro: grossly intact  ASSESSMENT/PLAN:   Uncomplicated Cystitis UA with 1+ leuks, positive nitrite and blood, consistent with UTI. Rx sent for Macrobid 100mg  BID x 5 days. Return precautions reviewed. If recurrent UTI in the future, question whether Jardiance with poorly controlled DM is playing a role.   Follow up with PCP for diabetes visit/health maintenance items.   , MD [facility]

## 2021-12-14 ENCOUNTER — Other Ambulatory Visit: Payer: Self-pay | Admitting: Family Medicine

## 2021-12-14 DIAGNOSIS — E1165 Type 2 diabetes mellitus with hyperglycemia: Secondary | ICD-10-CM

## 2021-12-15 ENCOUNTER — Other Ambulatory Visit: Payer: Self-pay | Admitting: Student

## 2021-12-15 DIAGNOSIS — E1165 Type 2 diabetes mellitus with hyperglycemia: Secondary | ICD-10-CM

## 2022-01-01 ENCOUNTER — Ambulatory Visit: Payer: Self-pay | Admitting: Podiatry

## 2022-01-01 DIAGNOSIS — M79676 Pain in unspecified toe(s): Secondary | ICD-10-CM

## 2022-01-01 DIAGNOSIS — M79674 Pain in right toe(s): Secondary | ICD-10-CM

## 2022-01-01 DIAGNOSIS — M79675 Pain in left toe(s): Secondary | ICD-10-CM

## 2022-01-01 DIAGNOSIS — E1165 Type 2 diabetes mellitus with hyperglycemia: Secondary | ICD-10-CM

## 2022-01-01 DIAGNOSIS — B351 Tinea unguium: Secondary | ICD-10-CM

## 2022-01-01 NOTE — Progress Notes (Signed)
  Subjective:  Patient ID: Virginia [name], female    DOB: 04/11/1970,  MRN: [id number]  Chief Complaint  Patient presents with   Nail Problem    Nail trim    51 y.o. female returns for the above complaint.  Patient presents with thickened elongated dystrophic toenails x8.  She states she is not able to do it herself.  She would like for me to do it.  She denies any other acute complaints.   Objective:  There were no vitals filed for this visit. Podiatric Exam: Vascular: dorsalis pedis and posterior tibial pulses are palpable bilateral. Capillary return is immediate. Temperature gradient is WNL. Skin turgor WNL  Sensorium: Decreased Semmes Weinstein monofilament test.  Decreased actile sensation bilaterally. Nail Exam: Pt has thick disfigured discolored nails with subungual debris noted bilateral entire nail second through fifth toenails. Pain on palpation to the nails.  X8 Ulcer Exam: There is no evidence of ulcer or pre-ulcerative changes or infection. Orthopedic Exam: Muscle tone and strength are WNL. No limitations in general ROM. No crepitus or effusions noted.  Skin: No Porokeratosis. No infection or ulcers    Assessment & Plan:   1. Pain due to onychomycosis of toenails of both feet   2. Uncontrolled type 2 diabetes mellitus with hyperglycemia ([place])      Patient was evaluated and treated and all questions answered.  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  She is benefiting from orthotics.  Onychomycosis with pain  -Nails palliatively debrided as below. -Educated on self-care  Procedure: Nail Debridement Rationale: pain  Type of Debridement: manual, sharp debridement. Instrumentation: Nail nipper, rotary burr. Number of Nails: 8  Procedures and Treatment: Consent by patient was obtained for treatment procedures. The patient understood the  discussion of treatment and procedures well. All questions were answered thoroughly reviewed. Debridement of mycotic and hypertrophic toenails, 1 through 5 bilateral and clearing of subungual debris. No ulceration, no infection noted.  Return Visit-Office Procedure: Patient instructed to return to the office for a follow up visit 3 months for continued evaluation and treatment.  Boneta Lucks, DPM    No follow-ups on file.

## 2022-01-29 ENCOUNTER — Ambulatory Visit: Payer: 59 | Admitting: Student

## 2022-01-30 ENCOUNTER — Ambulatory Visit: Payer: 59 | Admitting: Student

## 2022-01-30 ENCOUNTER — Encounter: Payer: Self-pay | Admitting: Student

## 2022-01-30 VITALS — BP 160/80 | HR 94 | Wt 244.0 lb

## 2022-01-30 DIAGNOSIS — I10 Essential (primary) hypertension: Secondary | ICD-10-CM

## 2022-01-30 DIAGNOSIS — E118 Type 2 diabetes mellitus with unspecified complications: Secondary | ICD-10-CM | POA: Diagnosis not present

## 2022-01-30 LAB — POCT GLYCOSYLATED HEMOGLOBIN (HGB A1C): HbA1c, POC (controlled diabetic range): 7.4 % — AB (ref 0.0–7.0)

## 2022-01-30 MED ORDER — OZEMPIC (2 MG/DOSE) 8 MG/3ML ~~LOC~~ SOPN: 2.0000 mg | PEN_INJECTOR | SUBCUTANEOUS | 3 refills | Status: DC

## 2022-01-30 MED ORDER — IRBESARTAN 150 MG PO TABS: 150.0000 mg | ORAL_TABLET | Freq: Every day | ORAL | 3 refills | Status: DC

## 2022-01-30 NOTE — Assessment & Plan Note (Addendum)
well controlled - Last A1c:  Lab Results  Component Value Date   HGBA1C 7.4 (A) 01/30/2022   - Medications: Ozempic and Jardiance  - Compliance: Good  - Hold basaglar  - UACr - Ozempic increased dose 2 mg  - BMP and lipid panel

## 2022-01-30 NOTE — Progress Notes (Signed)
  SUBJECTIVE:   CHIEF COMPLAINT / HPI:   Type 2 Diabetes: Home medications include: Jardiance, Ozempic 1 mg. Stopped her Basaglar 50 U daily as it made her feel dizzy and hypoglycemic; has not been checking her BG but feels well. Does endorse compliance. Home glucose monitoring is not performed.   Most recent A1Cs:  Lab Results  Component Value Date   HGBA1C 7.4 (A) 01/30/2022   HGBA1C 7.7 (A) 06/04/2021   Last Microalbumin, LDL, Creatinine: Lab Results  Component Value Date   LDLCALC 51 10/25/2019   CREATININE 0.83 11/19/2020    Patient is not up to date on diabetic eye. Patient is up to date on diabetic foot exam.   PERTINENT  PMH / PSH: DM, HTN, obesity  OBJECTIVE:  BP (!) 160/80   Pulse 94   Wt 244 lb (110.7 kg)   SpO2 100%   BMI 39.38 kg/m  Physical Exam Vitals reviewed.  Constitutional:      Appearance: Normal appearance.  HENT:     Head: Normocephalic.     Nose: Nose normal.     Mouth/Throat:     Mouth: Mucous membranes are moist.  Eyes:     Conjunctiva/sclera: Conjunctivae normal.  Cardiovascular:     Rate and Rhythm: Normal rate and regular rhythm.  Pulmonary:     Effort: Pulmonary effort is normal.     Breath sounds: Normal breath sounds.  Musculoskeletal:     Cervical back: Normal range of motion.  Skin:    Capillary Refill: Capillary refill takes less than 2 seconds.  Neurological:     General: No focal deficit present.     Mental Status: She is alert.  Psychiatric:        Mood and Affect: Mood normal.        Behavior: Behavior normal.      ASSESSMENT/PLAN:  Controlled diabetes mellitus type 2 with complications, unspecified whether long term insulin use (HCC) Well controlled without Insulin, could improve to <7. Increase Ozempic dosage and obtain UACr and updated lipid panel   -     POCT glycosylated hemoglobin (Hb A1C) -     Microalbumin / creatinine urine ratio -     Ozempic (2 MG/DOSE); Inject 2 mg into the skin once a week.   Dispense: 9 mL; Refill: 3 -     Irbesartan; Take 1 tablet (150 mg total) by mouth at bedtime.  Dispense: 90 tablet; Refill: 3 -     Basic metabolic panel -     Lipid panel  HYPERTENSION, BENIGN SYSTEMIC Assessment & Plan: BP: (!) 160/80 today. Elevated today. Continue to work on healthy dietary habits and exercise. Follow up in 3 months.   Medication regimen: Increased to 150 mg Irbesartan    Orders: -     Irbesartan; Take 1 tablet (150 mg total) by mouth at bedtime.  Dispense: 90 tablet; Refill: 3   Return in about 3 months (around 05/02/2022). Alfredo Martinez, MD 01/31/2022, 7:19 AM PGY-2, [facility]

## 2022-01-30 NOTE — Assessment & Plan Note (Signed)
BP: (!) 160/80 today. Elevated today. Continue to work on healthy dietary habits and exercise. Follow up in 3 months.   Medication regimen: Increased to 150 mg Irbesartan

## 2022-01-30 NOTE — Patient Instructions (Addendum)
It was great to see you today! Thank you for choosing [facility] for your primary care. Virginia [name] was seen for follow up.  Today we addressed: You are doing well with you sugar, please check it as needed and I have increased your Ozempic  I increased your Irbesartan to 150 mg as well Return in 3 months for follow up   If you haven't already, sign up for My Chart to have easy access to your labs results, and communication with your primary care physician.  We are checking some labs today. If they are abnormal, I will call you. If they are normal, I will send you a MyChart message (if it is active) or a letter in the mail. If you do not hear about your labs in the next 2 weeks, please call the office. I recommend that you always bring your medications to each appointment as this makes it easy to ensure you are on the correct medications and helps [place] not miss refills when you need them. Call the clinic at [phone] if your symptoms worsen or you have any concerns.  You should return to our clinic Return in about 3 months (around 05/02/2022). Please arrive 15 minutes before your appointment to ensure smooth check in process.  We appreciate your efforts in making this happen.  Thank you for allowing me to participate in your care, Alfredo Martinez, MD 01/30/2022, 11:36 AM PGY-2, [facility]

## 2022-01-31 LAB — BASIC METABOLIC PANEL
BUN/Creatinine Ratio: 23 (ref 9–23)
BUN: 26 mg/dL — ABNORMAL HIGH (ref 6–24)
CO2: 23 mmol/L (ref 20–29)
Calcium: 9.5 mg/dL (ref 8.7–10.2)
Chloride: 104 mmol/L (ref 96–106)
Creatinine, Ser: 1.11 mg/dL — ABNORMAL HIGH (ref 0.57–1.00)
Glucose: 88 mg/dL (ref 70–99)
Potassium: 4.2 mmol/L (ref 3.5–5.2)
Sodium: 141 mmol/L (ref 134–144)
eGFR: 60 mL/min/{1.73_m2} (ref >59)

## 2022-01-31 LAB — LIPID PANEL
Chol/HDL Ratio: 2.3 ratio (ref 0.0–4.4)
Cholesterol, Total: 114 mg/dL (ref 100–199)
HDL: 50 mg/dL (ref >39)
LDL Chol Calc (NIH): 49 mg/dL (ref 0–99)
Triglycerides: 70 mg/dL (ref 0–149)
VLDL Cholesterol Cal: 15 mg/dL (ref 5–40)

## 2022-02-02 LAB — MICROALBUMIN / CREATININE URINE RATIO
Creatinine, Urine: 108.6 mg/dL
Microalb/Creat Ratio: 27 mg/g creat (ref 0–29)
Microalbumin, Urine: 29.5 ug/mL

## 2022-02-03 ENCOUNTER — Other Ambulatory Visit: Payer: Self-pay | Admitting: Student

## 2022-02-03 DIAGNOSIS — R7989 Other specified abnormal findings of blood chemistry: Secondary | ICD-10-CM

## 2022-02-05 ENCOUNTER — Encounter: Payer: Self-pay | Admitting: Student

## 2022-02-16 IMAGING — MR MR FOOT*R* WO/W CM
5 of 9 series · 19 of 40 positions shown · IV contrast (Yes GAD)
Comparison: Radiograph 11/16/2020

CLINICAL DATA: Osteomyelitis, foot right great toe

EXAM:
MRI OF THE RIGHT FOREFOOT WITHOUT AND WITH CONTRAST
TECHNIQUE: Multiplanar, multisequence MR imaging of the right foot was
performed before and after the administration of intravenous
contrast.
CONTRAST:  10 mL Gadavist

[Series 3: T1 · axial · 3.0mm · 0.51mm/px · z∈[-74,+45]mm · 4 of 35 slices shown (1 of 2)]
[im 1/35]
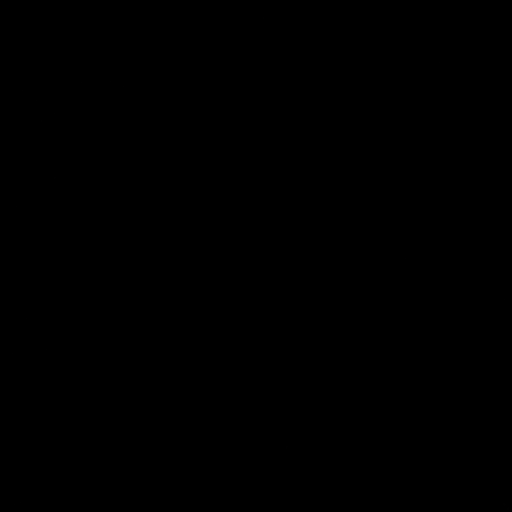
[im 12/35]
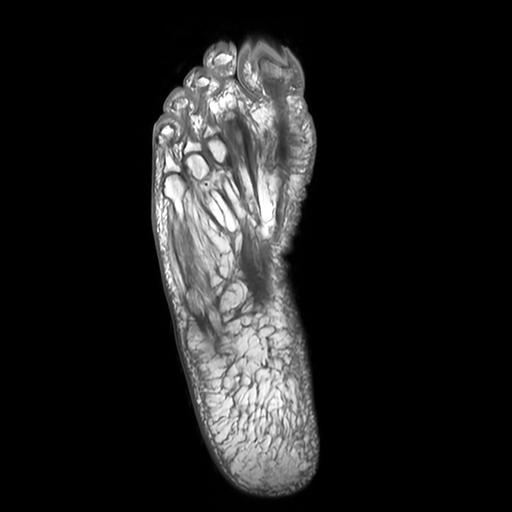
[im 23/35]
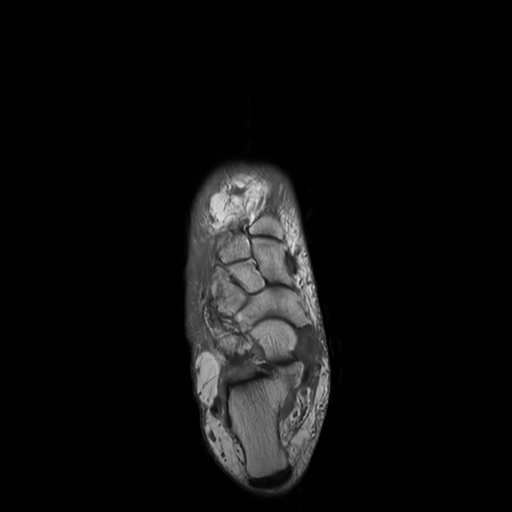
[im 35/35]
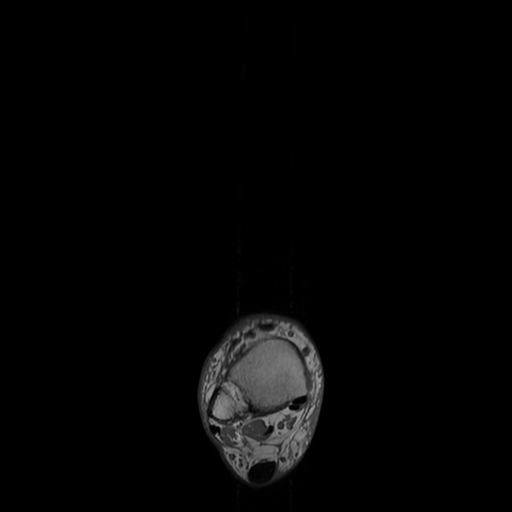

[Series 4: T1 fat-sat · axial · non-contrast · 3.0mm · 0.51mm/px · z∈[-74,+45]mm · 3 of 35 slices shown (1 of 2)]
[im 1/35]
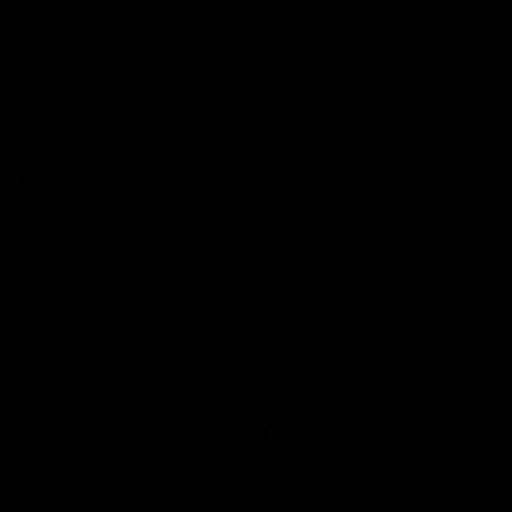
[im 18/35]
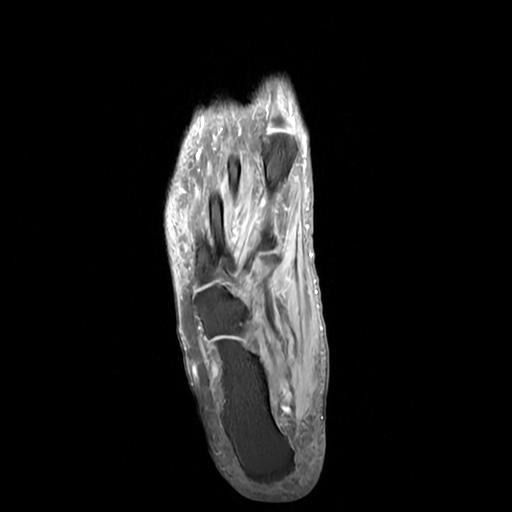
[im 35/35]
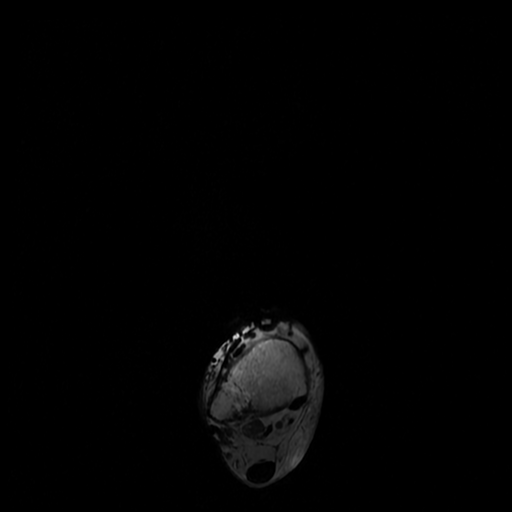

[Series 5: T2 fat-sat · axial · 3.0mm · 0.51mm/px · z∈[-74,+45]mm · 3 of 35 slices shown]
[im 1/35]
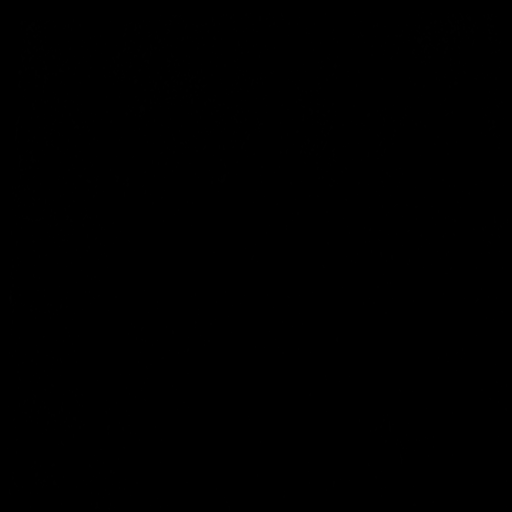
[im 18/35]
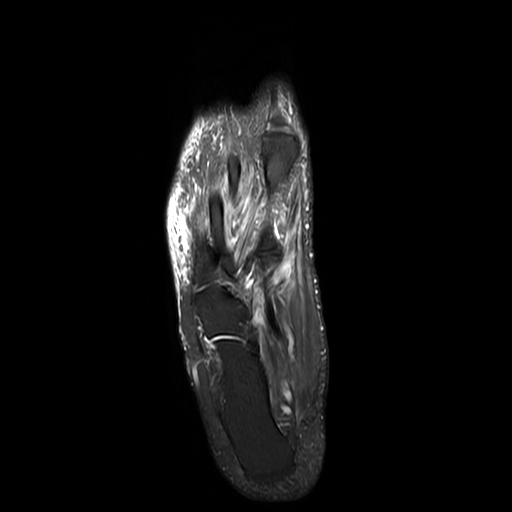
[im 35/35]
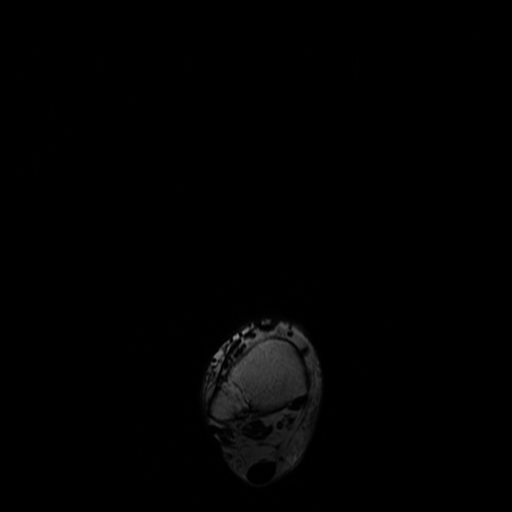

[Series 8: T1 · coronal · 3.0mm · 0.31mm/px · 7 of 72 slices shown (2 of 2)]
[im 1/72]
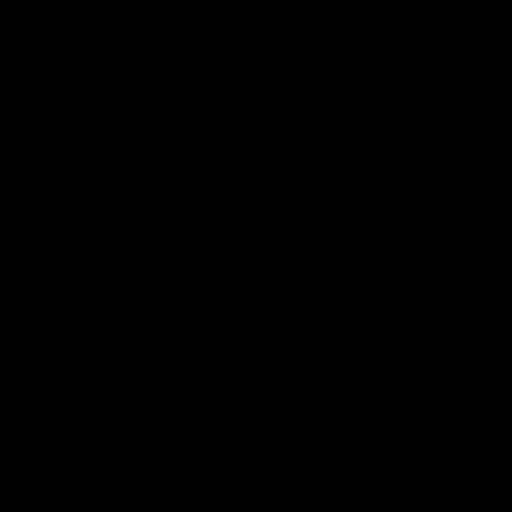
[im 12/72]
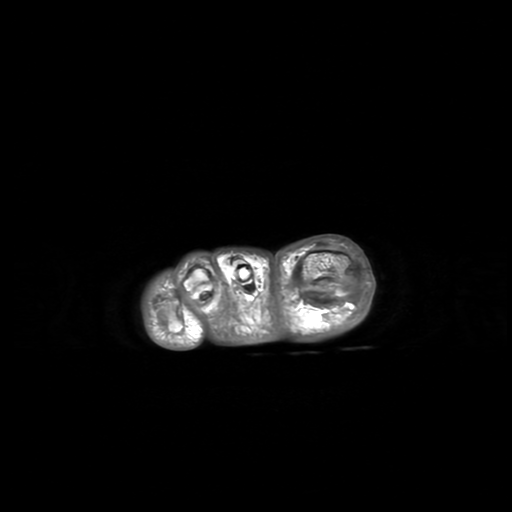
[im 24/72]
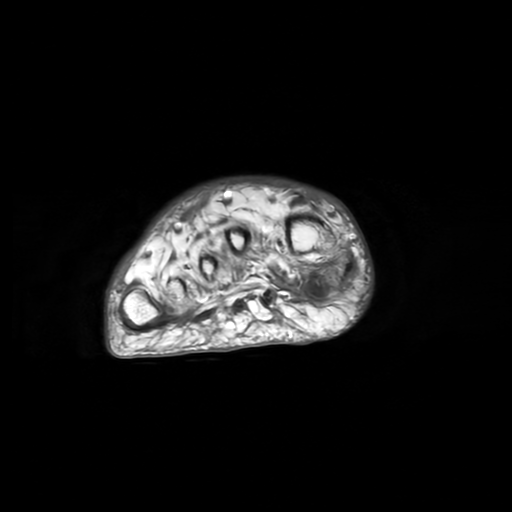
[im 36/72]
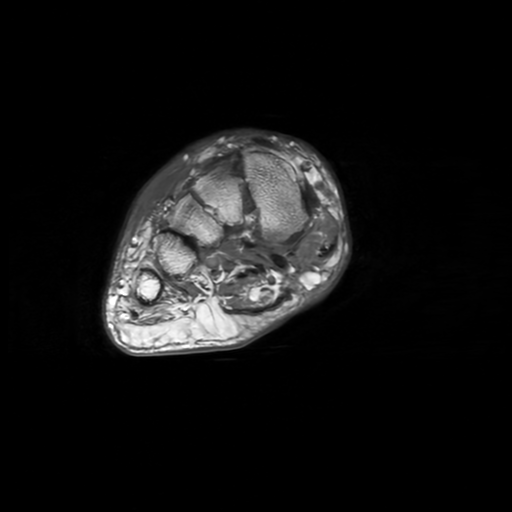
[im 48/72]
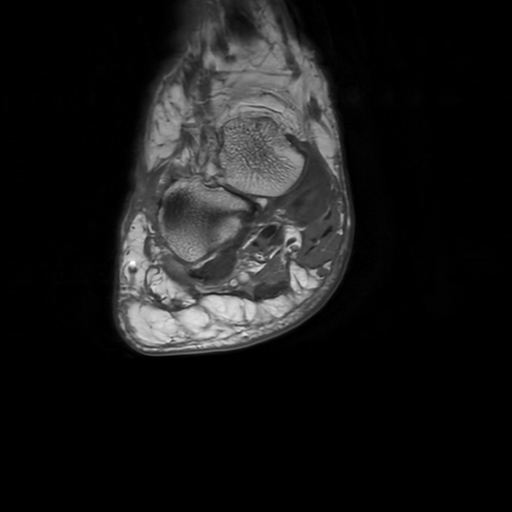
[im 60/72]
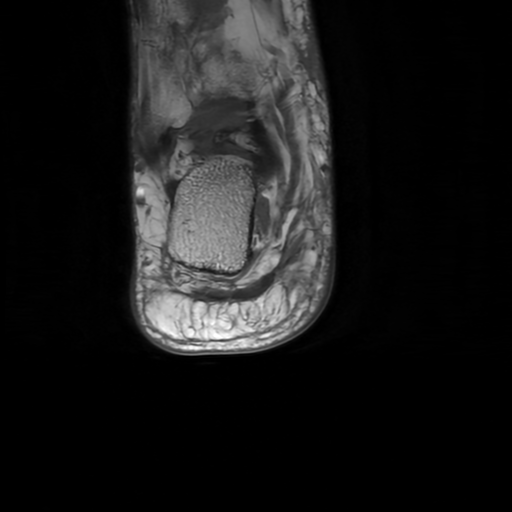
[im 72/72]
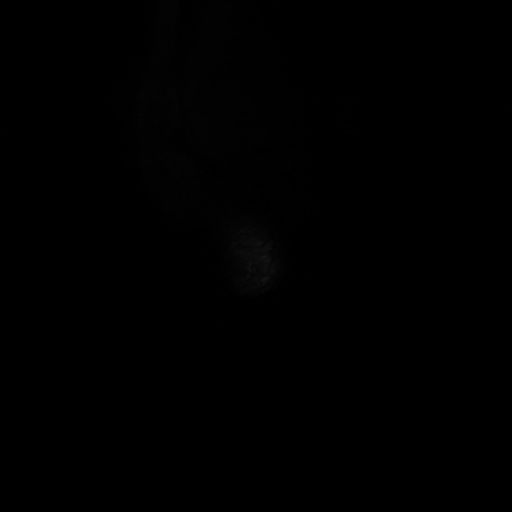

[Series 9: T1 fat-sat · axial · 3.0mm · 0.51mm/px · z∈[-74,-14]mm · 2 of 35 slices shown (2 of 2)]
[im 1/35]
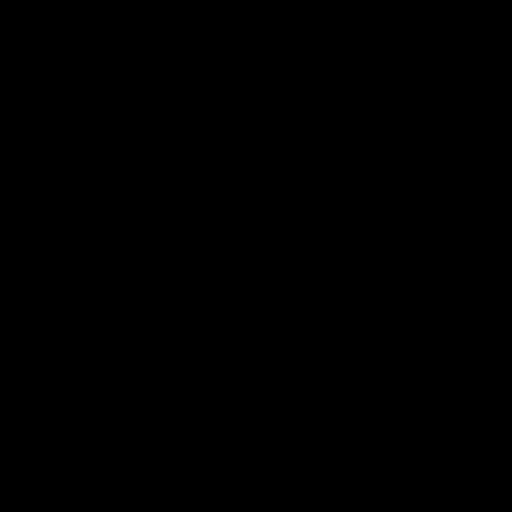
[im 18/35]
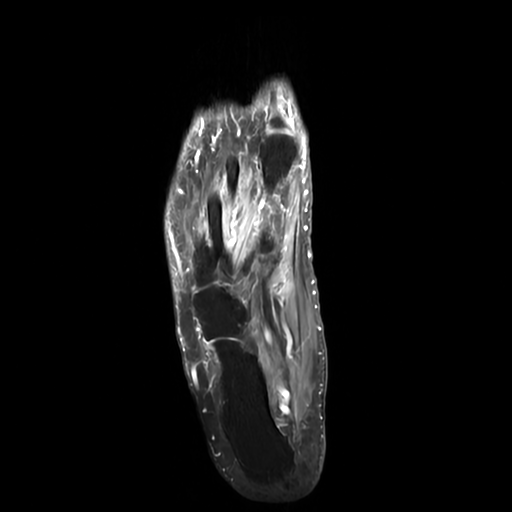

[19 of 40 positions shown; findings below may reference images not displayed]

FINDINGS: Bones/Joint/Cartilage

There is bony edema and confluent low T1 signal with postcontrast
enhancement of the great toe distal phalanx. No evidence of acute
fracture. Alignment is normal. No significant joint effusions.

Ligaments

The Lisfranc ligament is intact.  Collateral ligaments are intact.

Muscles and Tendons

Mild muscle atrophy and diffuse intramuscular edema throughout the
foot, commonly seen in diabetics. Mild thickening of the proximal
medial bundle plantar fascia.

Soft tissues

Diffuse superficial soft tissue swelling of the foot. There is a
soft tissue ulcer along the great toe. There is no evidence of soft
tissue abscess no intermetatarsal neuroma or bursitis.
IMPRESSION: Osteomyelitis of the great toe distal phalanx with adjacent soft
tissue ulcer. No evidence of soft tissue abscess.

## 2022-02-17 ENCOUNTER — Other Ambulatory Visit: Payer: Self-pay | Admitting: Student

## 2022-02-17 DIAGNOSIS — I10 Essential (primary) hypertension: Secondary | ICD-10-CM

## 2022-02-17 IMAGING — DX DG FOOT COMPLETE 3+V*R*
2 series · 3 of 3 positions shown · non-contrast
Comparison: 11/16/2020

CLINICAL DATA: Right great toe amputation

EXAM:
RIGHT FOOT COMPLETE - 3+ VIEW

[Series 1: foot · 0.14mm/px · 2 of 2 slices shown]
[im 1/2]
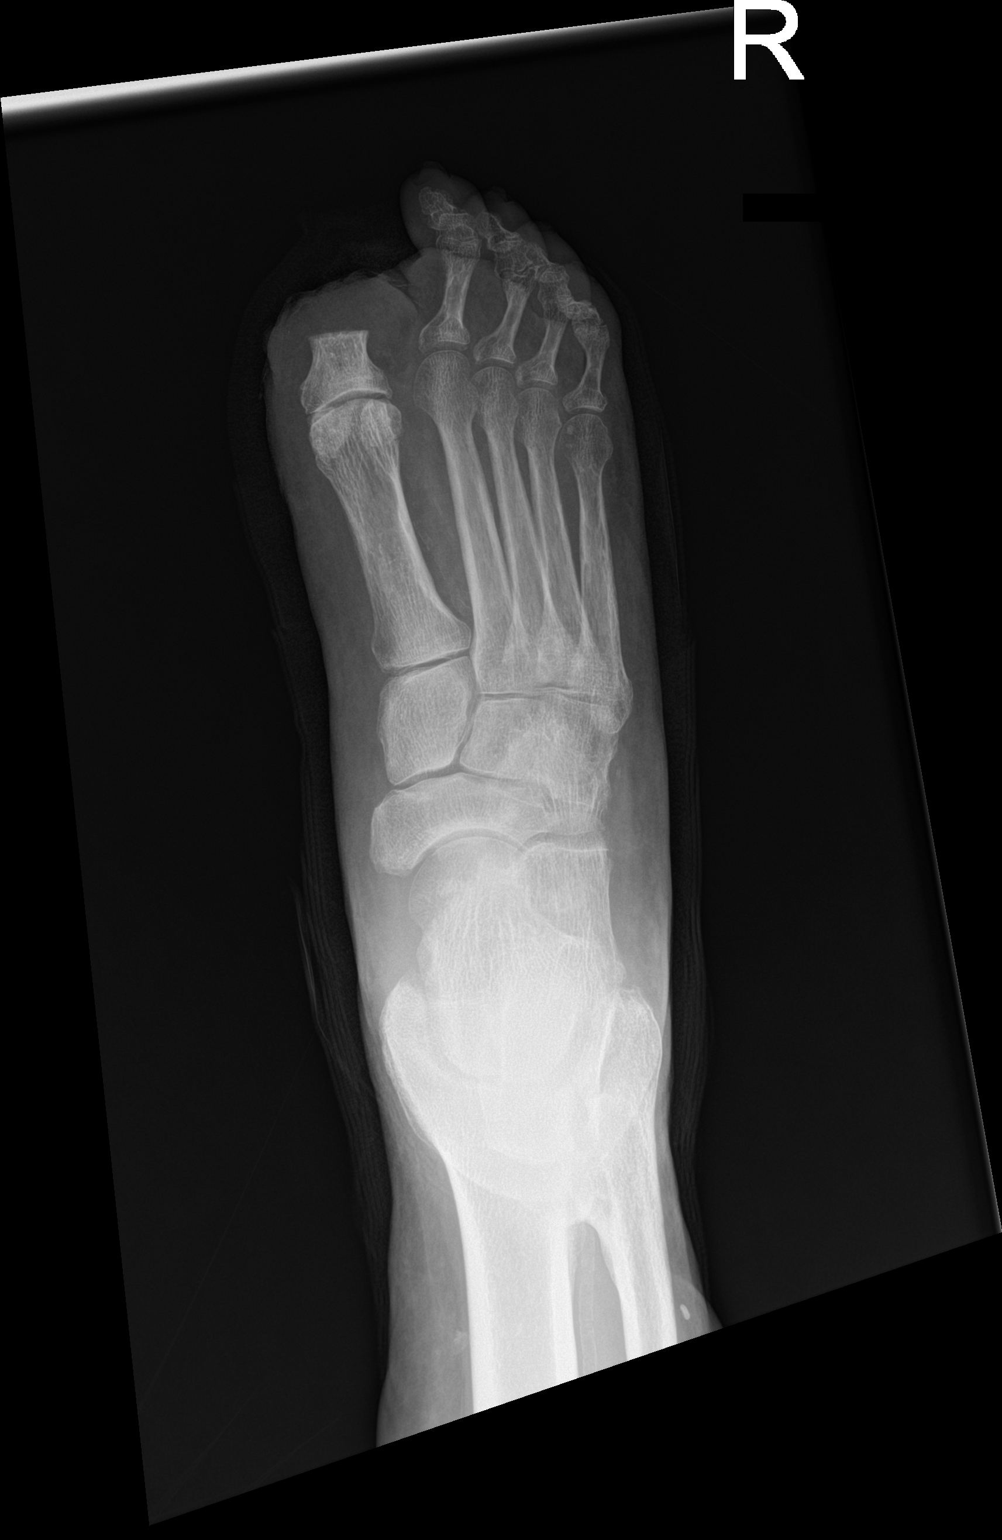
[im 2/2]
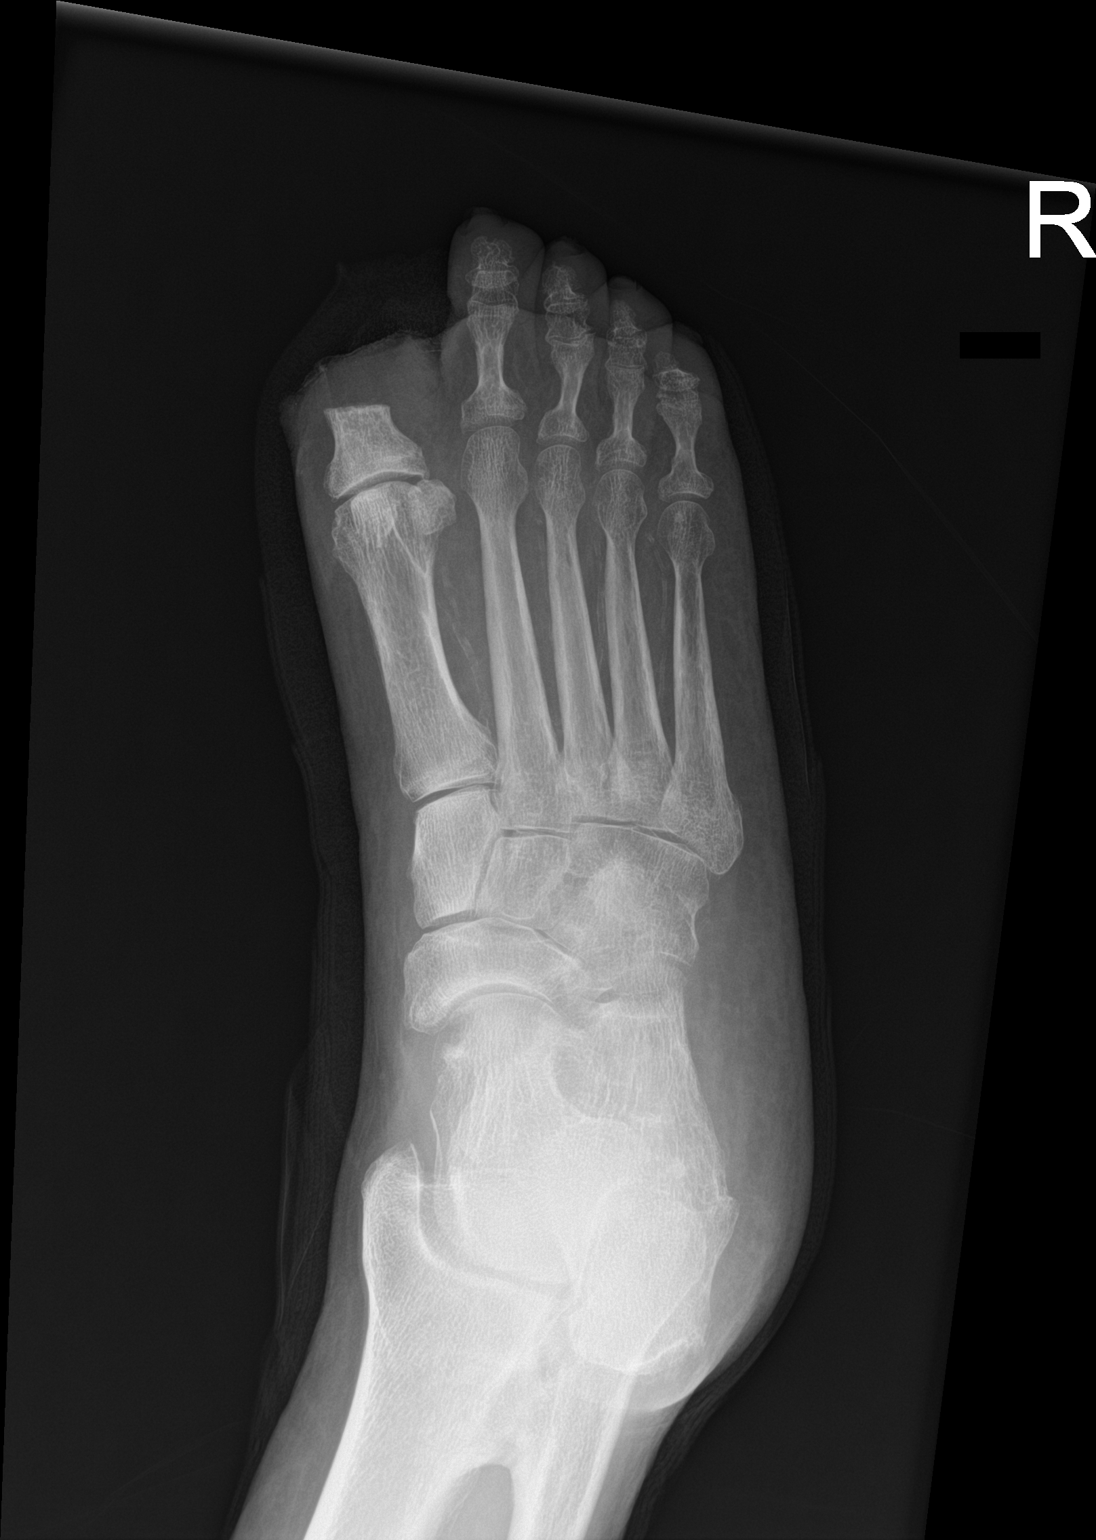

[leg]
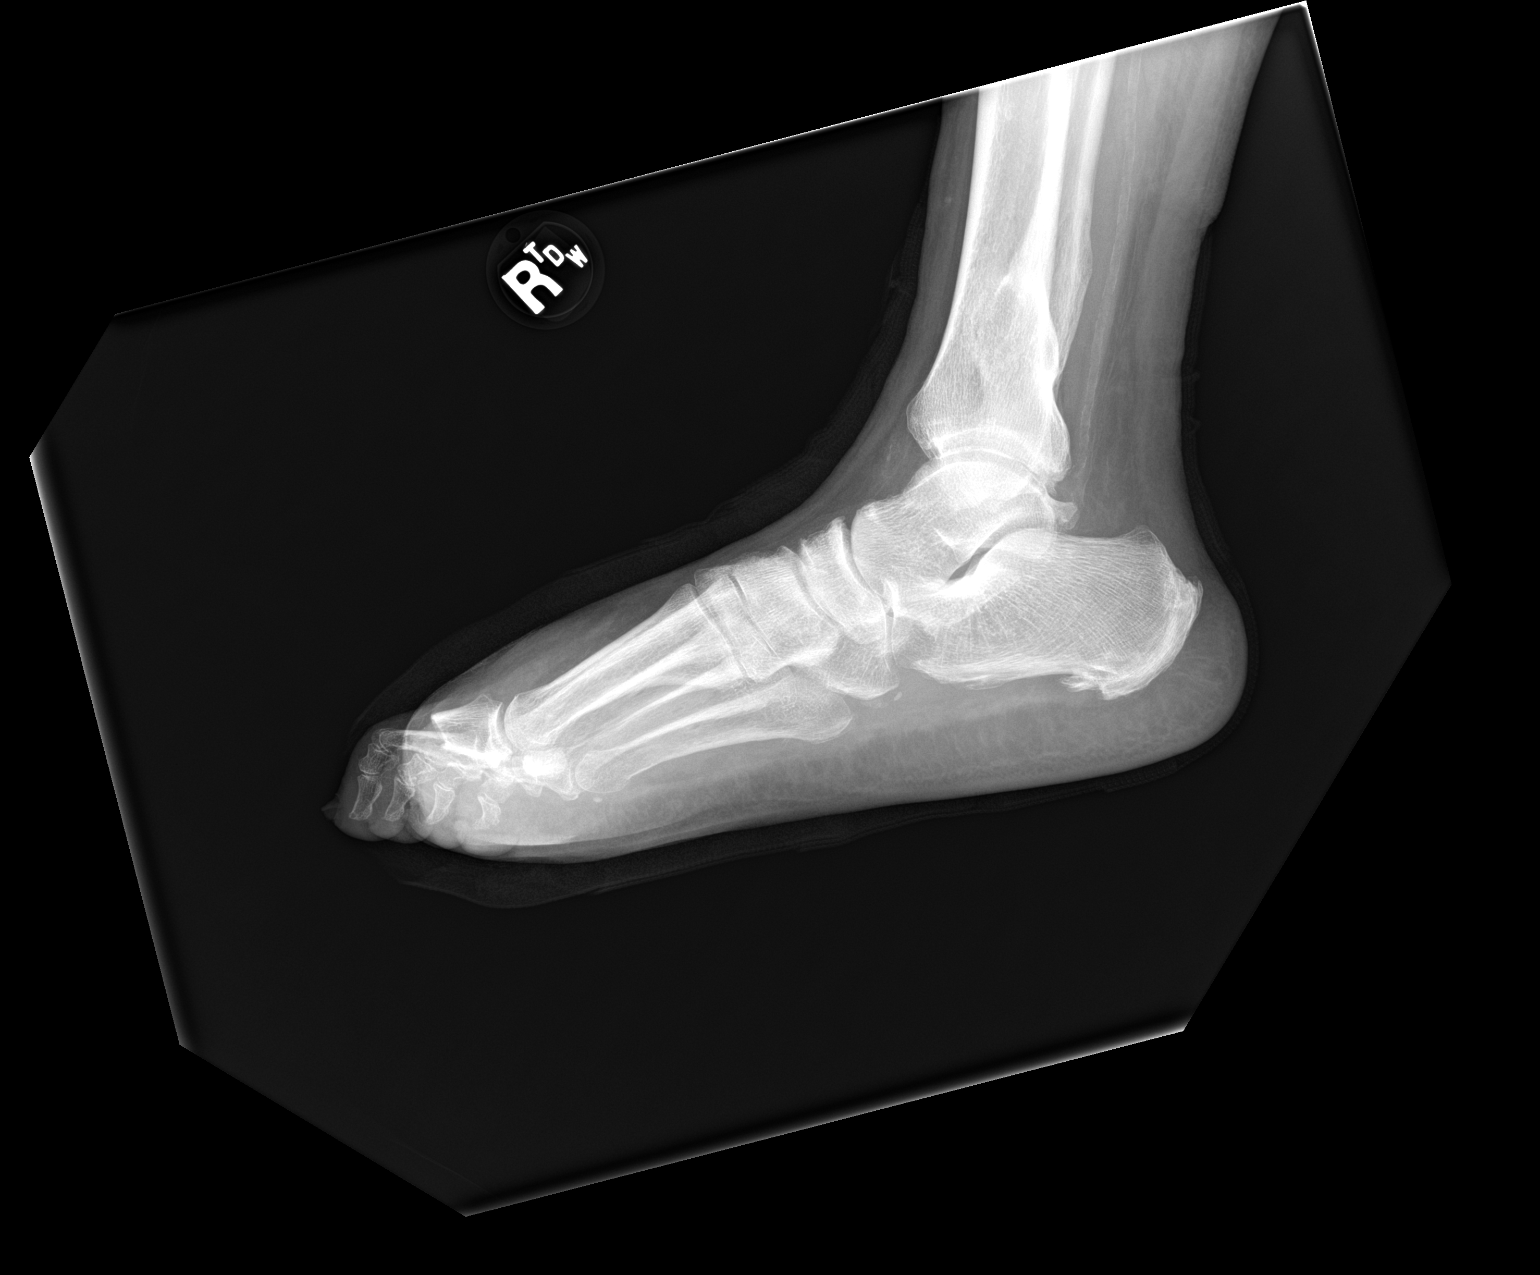

[3 of 3 positions shown; findings below may reference images not displayed]

FINDINGS: Status post right great toe amputation extending to the great toe
proximal phalanx. Preservation of the first MTP joint. Expected
postoperative appearance.

Mild osteopenia. Peripheral vascular calcifications noted. Chronic
ankle changes with healed deformity.
IMPRESSION: Status post right great toe amputation. Expected postoperative
appearance.

Peripheral atherosclerosis

## 2022-02-24 ENCOUNTER — Telehealth: Payer: Self-pay | Admitting: Student

## 2022-02-24 NOTE — Telephone Encounter (Signed)
Tried to call the patient regarding need for updated BMP, future order placed for lab visit. She did not answer, unable to leave a VM. If returns call to clinic, please have her schedule a lab visit for updated BMP, will send letter as well.

## 2022-02-24 NOTE — Telephone Encounter (Signed)
-----   Message from Alfredo Martinez, MD sent at 02/03/2022  9:00 AM EST ----- Needs repeat BMP at lab visit, need future order

## 2022-05-07 ENCOUNTER — Encounter: Payer: Self-pay | Admitting: Student

## 2022-05-07 ENCOUNTER — Other Ambulatory Visit: Payer: Self-pay | Admitting: Family Medicine

## 2022-05-07 DIAGNOSIS — E1165 Type 2 diabetes mellitus with hyperglycemia: Secondary | ICD-10-CM

## 2022-06-05 ENCOUNTER — Other Ambulatory Visit: Payer: Self-pay | Admitting: Family Medicine

## 2022-06-05 DIAGNOSIS — E782 Mixed hyperlipidemia: Secondary | ICD-10-CM

## 2022-06-17 ENCOUNTER — Other Ambulatory Visit: Payer: Self-pay | Admitting: Emergency Medicine

## 2022-06-17 ENCOUNTER — Ambulatory Visit
Admission: RE | Admit: 2022-06-17 | Discharge: 2022-06-17 | Disposition: A | Discharge status: Home / Self Care | Payer: Worker's Compensation | Source: Ambulatory Visit | Attending: Emergency Medicine | Admitting: Emergency Medicine

## 2022-06-17 DIAGNOSIS — S34109A Unspecified injury to unspecified level of lumbar spinal cord, initial encounter: Secondary | ICD-10-CM

## 2022-06-17 DIAGNOSIS — M546 Pain in thoracic spine: Secondary | ICD-10-CM

## 2022-07-01 ENCOUNTER — Encounter: Payer: Self-pay | Admitting: Student

## 2022-07-01 ENCOUNTER — Ambulatory Visit: Payer: 59 | Admitting: Student

## 2022-07-01 VITALS — BP 149/81 | HR 101 | Ht 66.0 in | Wt 228.2 lb

## 2022-07-01 DIAGNOSIS — E11628 Type 2 diabetes mellitus with other skin complications: Secondary | ICD-10-CM | POA: Diagnosis not present

## 2022-07-01 DIAGNOSIS — I10 Essential (primary) hypertension: Secondary | ICD-10-CM

## 2022-07-01 DIAGNOSIS — E118 Type 2 diabetes mellitus with unspecified complications: Secondary | ICD-10-CM | POA: Diagnosis not present

## 2022-07-01 DIAGNOSIS — Z1231 Encounter for screening mammogram for malignant neoplasm of breast: Secondary | ICD-10-CM

## 2022-07-01 DIAGNOSIS — M549 Dorsalgia, unspecified: Secondary | ICD-10-CM

## 2022-07-01 LAB — POCT GLYCOSYLATED HEMOGLOBIN (HGB A1C): HbA1c, POC (controlled diabetic range): 7.4 % — AB (ref 0.0–7.0)

## 2022-07-01 MED ORDER — AMLODIPINE BESYLATE 5 MG PO TABS
5.0000 mg | ORAL_TABLET | Freq: Every day | ORAL | 0 refills | Status: DC
Start: 2022-07-01 — End: 2023-10-15

## 2022-07-01 NOTE — Progress Notes (Unsigned)
    SUBJECTIVE:   CHIEF COMPLAINT / HPI:   T2DM Last seen on 01/31/2022 with A1c of 7.4.  She is currently taking Jardiance 25 mg daily, and Ozempic 2 mg weekly.  She is on a statin. Checks blood sugars daily, run in 1-teens to 140's. Has had UTIs since being on Jardiance, last one per pt over 2 months ago. She would like to stay on Jardiance for now but if she gets more UTIs may want to stop taking it.  A1c today of 7.4  HTN Currently taking irbesartan 150 mg. Denies CP, light headedness, SOB.  Has not been checking blood sugars at home.   Back pain 2 weeks ago, lost balance while pushing lg object at work, fell and hurt back. Did not hit head. Is following with a doctor with her work. Had x rays which were nonconcerning and they are in the process of getting her in with PT. She is currently taking aleve as needed for the pain.   Health maintenance Due for Pap smear, DM foot exam, DM eye exam, mammogram   PERTINENT  PMH / PSH: T2DM. HTN  OBJECTIVE:   BP (!) 149/81   Pulse (!) 101   Ht  (1.676 m)   Wt 228 lb 3.2 oz (103.5 kg)   SpO2 97%   BMI 36.83 kg/m    General: NAD, pleasant, able to participate in exam Cardiac: RRR, no murmurs. Respiratory: CTAB, normal effort, No wheezes, rales or rhonchi Skin: warm and dry, no rashes noted Neuro: alert, no obvious focal deficits Psych: Normal affect and mood  ASSESSMENT/PLAN:   HYPERTENSION, BENIGN SYSTEMIC BP above goal.  Patient agrees to add amlodipine 5 mg in addition to irbesartan 150 mg.  She plans on checking her blood pressures at home and bring these recordings to next visit when she comes for Pap smear.  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  We discussed adding metformin to her regimen but she was not able to tolerate this in the past due to GI side effects.  Patient prefers to continue on current medications at this time.  If she develops more UTIs, will  return to discuss coming off of Jardiance and exploring other options.  Order placed for ophthalmology referral for DM eye exam  Acute back pain Patient feels this is being appropriately followed by physician with her work and will continue to follow with them for this issue and keep me updated on any findings and if any medications are prescribed.   Mammogram ordered Patient to return on 5/3 a Pap and follow-up blood pressure  Dr. Erick Alley, DO [facility]

## 2022-07-01 NOTE — Patient Instructions (Addendum)
It was great to see you! Thank you for allowing me to participate in your care!  I recommend that you always bring your medications to each appointment as this makes it easy to ensure you are on the correct medications and helps [place] not miss when refills are needed.  Our plans for today:  -Call the [facility] to schedule your mammogram:  [address] [place], [place] [address] Phone [phone]  -You will be called to schedule apt with ophthalmology   -I sent in a new blood pressure medication called Amlodipine. Take once daily along with the irbesartan. Goal blood pressure is less than 140/90 -Check blood pressure once daily and bring the recording to next visit when you come for pap smear   -Return in the next month or two for a pap smear   We are checking some labs today, I will call you if they are abnormal will send you a MyChart message or a letter if they are normal.  If you do not hear about your labs in the next 2 weeks please let us know.  Take care and seek immediate care sooner if you develop any concerns.   Dr. Erick Alley, DO [facility]

## 2022-07-02 DIAGNOSIS — M549 Dorsalgia, unspecified: Secondary | ICD-10-CM | POA: Insufficient documentation

## 2022-07-02 LAB — BASIC METABOLIC PANEL
BUN/Creatinine Ratio: 19 (ref 9–23)
BUN: 16 mg/dL (ref 6–24)
CO2: 24 mmol/L (ref 20–29)
Calcium: 9.5 mg/dL (ref 8.7–10.2)
Chloride: 103 mmol/L (ref 96–106)
Creatinine, Ser: 0.85 mg/dL (ref 0.57–1.00)
Glucose: 101 mg/dL — ABNORMAL HIGH (ref 70–99)
Potassium: 4.2 mmol/L (ref 3.5–5.2)
Sodium: 141 mmol/L (ref 134–144)
eGFR: 82 mL/min/{1.73_m2} (ref >59)

## 2022-07-02 NOTE — Assessment & Plan Note (Addendum)
Patient feels this is being appropriately followed by physician with her work and will continue to follow with them for this issue and keep me updated on any findings and if any medications are prescribed.

## 2022-07-02 NOTE — Assessment & Plan Note (Addendum)
A1c slightly above goal at 7.4.  We discussed adding metformin to her regimen but she was not able to tolerate this in the past due to GI side effects.  Patient prefers to continue on current medications at this time.  If she develops more UTIs, will return to discuss coming off of Jardiance and exploring other options.  Order placed for ophthalmology referral for DM eye exam

## 2022-07-02 NOTE — Assessment & Plan Note (Signed)
BP above goal.  Patient agrees to add amlodipine 5 mg in addition to irbesartan 150 mg.  She plans on checking her blood pressures at home and bring these recordings to next visit when she comes for Pap smear.

## 2022-07-16 ENCOUNTER — Ambulatory Visit
Admission: RE | Admit: 2022-07-16 | Discharge: 2022-07-16 | Disposition: A | Discharge status: Home / Self Care | Payer: 59 | Source: Ambulatory Visit | Attending: Family Medicine | Admitting: Family Medicine

## 2022-07-16 DIAGNOSIS — Z1231 Encounter for screening mammogram for malignant neoplasm of breast: Secondary | ICD-10-CM

## 2022-07-18 ENCOUNTER — Encounter: Payer: Self-pay | Admitting: Student

## 2022-07-18 ENCOUNTER — Other Ambulatory Visit (HOSPITAL_COMMUNITY)
Admission: RE | Admit: 2022-07-18 | Discharge: 2022-07-18 | Disposition: A | Discharge status: Home / Self Care | Payer: 59 | Source: Ambulatory Visit | Attending: Family Medicine | Admitting: Family Medicine

## 2022-07-18 ENCOUNTER — Other Ambulatory Visit: Payer: Self-pay

## 2022-07-18 ENCOUNTER — Ambulatory Visit: Payer: 59 | Admitting: Student

## 2022-07-18 VITALS — BP 142/76 | HR 106 | Ht 66.0 in | Wt 230.4 lb

## 2022-07-18 DIAGNOSIS — N889 Noninflammatory disorder of cervix uteri, unspecified: Secondary | ICD-10-CM

## 2022-07-18 DIAGNOSIS — M545 Low back pain, unspecified: Secondary | ICD-10-CM

## 2022-07-18 DIAGNOSIS — Z124 Encounter for screening for malignant neoplasm of cervix: Secondary | ICD-10-CM | POA: Diagnosis present

## 2022-07-18 DIAGNOSIS — Z113 Encounter for screening for infections with a predominantly sexual mode of transmission: Secondary | ICD-10-CM

## 2022-07-18 DIAGNOSIS — I10 Essential (primary) hypertension: Secondary | ICD-10-CM

## 2022-07-18 NOTE — Progress Notes (Addendum)
    SUBJECTIVE:   CHIEF COMPLAINT / HPI:   HTN BP elevated last visit, amlodipine 5 mg added in addition to irbesartan 150 mg daily.  She has been checking her blood pressures at home which are 130's/70-80's. Hasn't taken BP meds today.   Patient due for routine Pap smear Last Pap smear was normal in 2018.  Patient states she has a history of a cervical growth which has been sampled and she has been told it is nothing to worry about.  Back pain Still occurin gand following with docotor at work works at [employer]. Molly Maduro looked great. Probably deep bruise. Getting PT - has had 2 sessions. Not getting better. Bilateral low back and constant   PERTINENT  PMH / PSH: ***  OBJECTIVE:   BP (!) 142/76   Pulse (!) 106   Ht 5\' 6"  (1.676 m)   Wt 230 lb 6.4 oz (104.5 kg)   SpO2 97%   BMI 37.19 kg/m    General: NAD, pleasant, able to participate in exam Cardiac: RRR, no murmurs. Respiratory: CTAB, normal effort, No wheezes, rales or rhonchi Abdomen: Bowel sounds present, nontender, nondistended, no hepatosplenomegaly. Extremities: no edema or cyanosis. Skin: warm and dry, no rashes noted Neuro: alert, no obvious focal deficits Psych: Normal affect and mood  ASSESSMENT/PLAN:   No problem-specific Assessment & Plan notes found for this encounter.   Return in 2 months for A1c  Dr. Erick Alley, DO [facility]    {    This will disappear when note is signed, click to select method of visit    :1}

## 2022-07-18 NOTE — Patient Instructions (Addendum)
It was great to see you! Thank you for allowing me to participate in your care!  I recommend that you always bring your medications to each appointment as this makes it easy to ensure you are on the correct medications and helps [place] not miss when refills are needed.  Our plans for today:  - You can take tylenol 1000 mg up to every 6 hours as needed in addition to aleve  - Return to further discuss back pain if desired - return in 2 month for A1c   We are checking some labs today, I will call you if they are abnormal will send you a MyChart message or a letter if they are normal.  If you do not hear about your labs in the next 2 weeks please let us know.  Take care and seek immediate care sooner if you develop any concerns.   Dr. Erick Alley, DO [facility]

## 2022-07-19 DIAGNOSIS — N889 Noninflammatory disorder of cervix uteri, unspecified: Secondary | ICD-10-CM | POA: Insufficient documentation

## 2022-07-19 NOTE — Assessment & Plan Note (Signed)
BP likely elevated today because pt has not taken BP meds yet. Per pt, BP is well controlled. Will not change medications at this time.

## 2022-07-19 NOTE — Assessment & Plan Note (Signed)
Pt does have hx of cervical polyp from 2011, unsure if this is the same. Will obtain records from previous OBGYN to get biopsy results done previously.

## 2022-07-19 NOTE — Assessment & Plan Note (Signed)
Pt will continue to follow with doctor at her work and continue PT. I did advise she can take Tylenol up to 1000 mg q6h prn in addition to aleve.

## 2022-07-21 LAB — CERVICOVAGINAL ANCILLARY ONLY
Chlamydia: NEGATIVE
Comment: NEGATIVE
Comment: NEGATIVE
Comment: NORMAL
Neisseria Gonorrhea: NEGATIVE
Trichomonas: NEGATIVE

## 2022-07-22 LAB — CYTOLOGY - PAP
Comment: NEGATIVE
Diagnosis: NEGATIVE
High risk HPV: NEGATIVE

## 2022-07-30 ENCOUNTER — Other Ambulatory Visit: Payer: Self-pay | Admitting: Student

## 2022-07-30 DIAGNOSIS — E1165 Type 2 diabetes mellitus with hyperglycemia: Secondary | ICD-10-CM

## 2022-09-10 ENCOUNTER — Ambulatory Visit: Payer: 59 | Admitting: Podiatry

## 2022-09-10 DIAGNOSIS — M79675 Pain in left toe(s): Secondary | ICD-10-CM

## 2022-09-10 DIAGNOSIS — E1165 Type 2 diabetes mellitus with hyperglycemia: Secondary | ICD-10-CM | POA: Diagnosis not present

## 2022-09-10 DIAGNOSIS — B351 Tinea unguium: Secondary | ICD-10-CM | POA: Diagnosis not present

## 2022-09-10 DIAGNOSIS — M79674 Pain in right toe(s): Secondary | ICD-10-CM | POA: Diagnosis not present

## 2022-09-10 NOTE — Progress Notes (Signed)
  Subjective:  Patient ID: Virginia [name], female    DOB: [birth year],  MRN: [id number]  Chief Complaint  Patient presents with   Nail Problem    Routine foot care   Foot Pain    Left foot pain since surgery    52 y.o. female returns for the above complaint.  Patient presents with thickened elongated dystrophic toenails x8.  She states she is not able to do it herself.  She would like for me to do it.  She denies any other acute complaints.   Objective:  There were no vitals filed for this visit. Podiatric Exam: Vascular: dorsalis pedis and posterior tibial pulses are palpable bilateral. Capillary return is immediate. Temperature gradient is WNL. Skin turgor WNL  Sensorium: Decreased Semmes Weinstein monofilament test.  Decreased actile sensation bilaterally. Nail Exam: Pt has thick disfigured discolored nails with subungual debris noted bilateral entire nail second through fifth toenails. Pain on palpation to the nails.  X8 Ulcer Exam: There is no evidence of ulcer or pre-ulcerative changes or infection. Orthopedic Exam: Muscle tone and strength are WNL. No limitations in general ROM. No crepitus or effusions noted.  Skin: No Porokeratosis. No infection or ulcers    Assessment & Plan:   1. Pain due to onychomycosis of toenails of both feet   2. Uncontrolled type 2 diabetes mellitus with hyperglycemia (HCC)       Patient was evaluated and treated and all questions answered.  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  She is benefiting from orthotics.  Onychomycosis with pain  -Nails palliatively debrided as below. -Educated on self-care  Procedure: Nail Debridement Rationale: pain  Type of Debridement: manual, sharp debridement. Instrumentation: Nail nipper, rotary burr. Number of Nails: 8  Procedures and Treatment: Consent by patient was obtained for  treatment procedures. The patient understood the discussion of treatment and procedures well. All questions were answered thoroughly reviewed. Debridement of mycotic and hypertrophic toenails, 1 through 5 bilateral and clearing of subungual debris. No ulceration, no infection noted.  Return Visit-Office Procedure: Patient instructed to return to the office for a follow up visit 3 months for continued evaluation and treatment.  Nicholes Rough, DPM    No follow-ups on file.

## 2022-09-22 ENCOUNTER — Telehealth: Payer: Self-pay | Admitting: Podiatry

## 2022-09-22 DIAGNOSIS — M79676 Pain in unspecified toe(s): Secondary | ICD-10-CM

## 2022-10-20 NOTE — Telephone Encounter (Signed)
error 

## 2022-12-02 ENCOUNTER — Other Ambulatory Visit: Payer: Self-pay | Admitting: Student

## 2022-12-02 DIAGNOSIS — E782 Mixed hyperlipidemia: Secondary | ICD-10-CM

## 2022-12-16 ENCOUNTER — Encounter: Payer: Self-pay | Admitting: Student

## 2022-12-16 ENCOUNTER — Other Ambulatory Visit: Payer: Self-pay | Admitting: Student

## 2022-12-16 DIAGNOSIS — E118 Type 2 diabetes mellitus with unspecified complications: Secondary | ICD-10-CM

## 2022-12-18 ENCOUNTER — Other Ambulatory Visit (HOSPITAL_COMMUNITY): Payer: Self-pay

## 2022-12-23 ENCOUNTER — Other Ambulatory Visit: Payer: Self-pay | Admitting: Student

## 2022-12-23 ENCOUNTER — Encounter: Payer: Self-pay | Admitting: Student

## 2022-12-23 DIAGNOSIS — B379 Candidiasis, unspecified: Secondary | ICD-10-CM

## 2022-12-23 MED ORDER — FLUCONAZOLE 150 MG PO TABS
150.0000 mg | ORAL_TABLET | Freq: Once | ORAL | 0 refills | Status: AC
Start: 2022-12-23 — End: 2022-12-23

## 2022-12-23 NOTE — Progress Notes (Signed)
Pt reports symptoms of vaginal yeast infection which she has had in the past. Will send in Rx for diflucan. Pt advised to come in for visit if symptoms persist.

## 2022-12-25 ENCOUNTER — Ambulatory Visit: Payer: 59 | Admitting: Podiatry

## 2022-12-25 ENCOUNTER — Encounter: Payer: Self-pay | Admitting: Podiatry

## 2022-12-25 VITALS — BP 182/90 | HR 93

## 2022-12-25 DIAGNOSIS — E1165 Type 2 diabetes mellitus with hyperglycemia: Secondary | ICD-10-CM | POA: Diagnosis not present

## 2022-12-25 DIAGNOSIS — M1460 Charcot's joint, unspecified site: Secondary | ICD-10-CM

## 2022-12-25 DIAGNOSIS — M14672 Charcot's joint, left ankle and foot: Secondary | ICD-10-CM | POA: Diagnosis not present

## 2022-12-25 NOTE — Progress Notes (Signed)
Subjective:  Patient ID: Virginia [name], female    DOB: Dec 23, 1970,  MRN: [id number]  Chief Complaint  Patient presents with   Foot Pain    "It's swollen on the bottom."    52 y.o. female presents with the above complaint.  Patient presents with left hyperkeratotic lesion on the plantar aspect of the midfoot with history of Charcot deformity.  She states that starting notice more pain.  She has been working overtime and many hours on her feet.  Denies any other acute complaints.   Review of Systems: Negative except as noted in the HPI. Denies N/V/F/Ch.  Past Medical History:  Diagnosis Date   Diabetes mellitus    Hypertension    on HCTZ   Obesity    Wears glasses     Current Outpatient Medications:    acetaminophen (TYLENOL) 500 MG tablet, Take 1,000 mg by mouth every 6 (six) hours as needed for moderate pain or headache., Disp: , Rfl:    amLODipine (NORVASC) 5 MG tablet, Take 1 tablet (5 mg total) by mouth at bedtime., Disp: 30 tablet, Rfl: 0   atorvastatin (LIPITOR) 40 MG tablet, Take 1 tablet (40 mg total) by mouth daily., Disp: 90 tablet, Rfl: 3   Blood Glucose Monitoring Suppl (ONETOUCH VERIO) w/Device KIT, 1 kit by Does not apply route daily., Disp: 1 kit, Rfl: 1   glucose blood (ONETOUCH VERIO) test strip, Use 3 times daily, Disp: 100 each, Rfl: 12   ibuprofen (ADVIL) 200 MG tablet, Take 400 mg by mouth every 6 (six) hours as needed for headache or moderate pain., Disp: , Rfl:    irbesartan (AVAPRO) 150 MG tablet, Take 1 tablet (150 mg total) by mouth at bedtime., Disp: 90 tablet, Rfl: 3   JARDIANCE 25 MG TABS tablet, Take 1 tablet by mouth once daily, Disp: 90 tablet, Rfl: 0   Multiple Vitamins-Minerals (MULTIVITAMIN WITH MINERALS) tablet, Take 1 tablet by mouth daily. Reported on 07/09/2015, Disp: , Rfl:    OZEMPIC, 2 MG/DOSE, 8 MG/3ML SOPN, INJECT 2MG  INTO THE SKIN ONCE A WEEK, Disp: 9 mL, Rfl: 0   polyethylene glycol (MIRALAX) 17 g packet, Take 17 g by mouth daily., Disp: 14  each, Rfl: 0   Turmeric (QC TUMERIC COMPLEX PO), Take 2 tablets by mouth daily. gummy, Disp: , Rfl:    Insulin Glargine (BASAGLAR KWIKPEN) 100 UNIT/ML, INJECT 50 UNITS SUBCUTANEOUSLY ONCE DAILY (Patient not taking: Reported on 07/01/2022), Disp: 15 mL, Rfl: 0   Insulin Pen Needle 31G X 5 MM MISC, Use one needle per application (Patient not taking: Reported on 07/01/2022), Disp: 100 each, Rfl: 6   Lancets (ONETOUCH ULTRASOFT) lancets, Please use to check blood sugar 3 times daily before meals. E11.65 (Patient not taking: Reported on 07/01/2022), Disp: 100 each, Rfl: 12   ONETOUCH DELICA LANCETS FINE MISC, 1 each by Does not apply route 2 (two) times daily. (Patient not taking: Reported on 07/01/2022), Disp: 100 each, Rfl: 5  Social History   Tobacco Use  Smoking Status Former   Current packs/day: 0.00   Average packs/day: 0.3 packs/day for 7.1 years (1.8 ttl pk-yrs)   Types: Cigarettes   Start date: 03/18/1991   Quit date: 05/03/1998   Years since quitting: 24.6  Smokeless Tobacco Never  Tobacco Comments   Works at [employer] - AND does NOT smoke.     Allergies  Allergen Reactions   Liraglutide Nausea And Vomiting    Projectile Vomiting   Shrimp [Shellfish Allergy] Shortness Of Breath  Ace Inhibitors Cough   Metformin And Related Diarrhea and Nausea And Vomiting   Rosuvastatin Other (See Comments)    Myalgia - Legs reported - resolved upon discontinuation.    Objective:   Vitals:   12/25/22 1403  BP: (!) 182/90  Pulse: 93   There is no height or weight on file to calculate BMI. Constitutional Well developed. Well nourished.  Vascular Dorsalis pedis pulses palpable bilaterally. Posterior tibial pulses palpable bilaterally. Capillary refill normal to all digits.  No cyanosis or clubbing noted. Pedal hair growth normal.  Neurologic Normal speech. Oriented to person, place, and time. Epicritic sensation to light touch grossly present bilaterally.  Dermatologic Nails well groomed  and normal in appearance. No open wounds. No skin lesions.  Orthopedic: Left Charcot deformity noted no open wounds or lesion noted.  Hyperkeratotic lesion superficially noted to left midfoot plantar.  Likely due to the underlying Charcot deformity.  No signs of infection noted   Radiographs: None Assessment:   1. Uncontrolled type 2 diabetes mellitus with hyperglycemia (HCC)   2. Charcot arthropathy    Plan:  Patient was evaluated and treated and all questions answered.  Left foot Charcot deformity with underlying hyperkeratotic lesion at the subcuboid -All questions and concerns were discussed with the patient in extensive detail -This is likely attributed to the overtime work that she has recently started.  I encouraged her to decrease the overtime.  She states understanding she would like to be off for next 4 weeks. -Encouraged her to wear cam boot for next 4 weeks she states understanding  No follow-ups on file.

## 2023-01-07 DIAGNOSIS — M79673 Pain in unspecified foot: Secondary | ICD-10-CM

## 2023-01-16 ENCOUNTER — Encounter: Payer: Self-pay | Admitting: Podiatry

## 2023-01-16 ENCOUNTER — Ambulatory Visit: Payer: 59 | Admitting: Podiatry

## 2023-01-16 DIAGNOSIS — E1165 Type 2 diabetes mellitus with hyperglycemia: Secondary | ICD-10-CM

## 2023-01-16 DIAGNOSIS — M1460 Charcot's joint, unspecified site: Secondary | ICD-10-CM

## 2023-01-16 NOTE — Progress Notes (Signed)
Subjective:  Patient ID: Virginia [name], female    DOB: 1970-08-31,  MRN: [id number]  Chief Complaint  Patient presents with   Nail Problem    Follow up    52 y.o. female presents with the above complaint.  Patient presents with left hyperkeratotic lesion on the plantar aspect of the midfoot with history of Charcot deformity.  She states that starting notice more pain.  She has been working overtime and many hours on her feet.  Denies any other acute complaints.   Review of Systems: Negative except as noted in the HPI. Denies N/V/F/Ch.  Past Medical History:  Diagnosis Date   Diabetes mellitus    Hypertension    on HCTZ   Obesity    Wears glasses     Current Outpatient Medications:    acetaminophen (TYLENOL) 500 MG tablet, Take 1,000 mg by mouth every 6 (six) hours as needed for moderate pain or headache., Disp: , Rfl:    amLODipine (NORVASC) 5 MG tablet, Take 1 tablet (5 mg total) by mouth at bedtime., Disp: 30 tablet, Rfl: 0   atorvastatin (LIPITOR) 40 MG tablet, Take 1 tablet (40 mg total) by mouth daily., Disp: 90 tablet, Rfl: 3   Blood Glucose Monitoring Suppl (ONETOUCH VERIO) w/Device KIT, 1 kit by Does not apply route daily., Disp: 1 kit, Rfl: 1   glucose blood (ONETOUCH VERIO) test strip, Use 3 times daily, Disp: 100 each, Rfl: 12   ibuprofen (ADVIL) 200 MG tablet, Take 400 mg by mouth every 6 (six) hours as needed for headache or moderate pain., Disp: , Rfl:    Insulin Glargine (BASAGLAR KWIKPEN) 100 UNIT/ML, INJECT 50 UNITS SUBCUTANEOUSLY ONCE DAILY, Disp: 15 mL, Rfl: 0   Insulin Pen Needle 31G X 5 MM MISC, Use one needle per application, Disp: 100 each, Rfl: 6   irbesartan (AVAPRO) 150 MG tablet, Take 1 tablet (150 mg total) by mouth at bedtime., Disp: 90 tablet, Rfl: 3   JARDIANCE 25 MG TABS tablet, Take 1 tablet by mouth once daily, Disp: 90 tablet, Rfl: 0   Lancets (ONETOUCH ULTRASOFT) lancets, Please use to check blood sugar 3 times daily before meals. E11.65, Disp: 100  each, Rfl: 12   Multiple Vitamins-Minerals (MULTIVITAMIN WITH MINERALS) tablet, Take 1 tablet by mouth daily. Reported on 07/09/2015, Disp: , Rfl:    ONETOUCH DELICA LANCETS FINE MISC, 1 each by Does not apply route 2 (two) times daily., Disp: 100 each, Rfl: 5   OZEMPIC, 2 MG/DOSE, 8 MG/3ML SOPN, INJECT 2MG  INTO THE SKIN ONCE A WEEK, Disp: 9 mL, Rfl: 0   polyethylene glycol (MIRALAX) 17 g packet, Take 17 g by mouth daily., Disp: 14 each, Rfl: 0   Turmeric (QC TUMERIC COMPLEX PO), Take 2 tablets by mouth daily. gummy, Disp: , Rfl:   Social History   Tobacco Use  Smoking Status Former   Current packs/day: 0.00   Average packs/day: 0.3 packs/day for 7.1 years (1.8 ttl pk-yrs)   Types: Cigarettes   Start date: 03/18/1991   Quit date: 05/03/1998   Years since quitting: 24.7  Smokeless Tobacco Never  Tobacco Comments   Works at [employer] - AND does NOT smoke.     Allergies  Allergen Reactions   Liraglutide Nausea And Vomiting    Projectile Vomiting   Shrimp [Shellfish Allergy] Shortness Of Breath   Ace Inhibitors Cough   Metformin And Related Diarrhea and Nausea And Vomiting   Rosuvastatin Other (See Comments)    Myalgia - Legs  reported - resolved upon discontinuation.    Objective:   There were no vitals filed for this visit.  There is no height or weight on file to calculate BMI. Constitutional Well developed. Well nourished.  Vascular Dorsalis pedis pulses palpable bilaterally. Posterior tibial pulses palpable bilaterally. Capillary refill normal to all digits.  No cyanosis or clubbing noted. Pedal hair growth normal.  Neurologic Normal speech. Oriented to person, place, and time. Epicritic sensation to light touch grossly present bilaterally.  Dermatologic Nails well groomed and normal in appearance. No open wounds. No skin lesions.  Orthopedic: Left Charcot deformity noted no open wounds or lesion noted.  Hyperkeratotic lesion superficially noted to left midfoot plantar.   Likely due to the underlying Charcot deformity.  No signs of infection noted   Radiographs: None Assessment:   1. Uncontrolled type 2 diabetes mellitus with hyperglycemia (HCC)   2. Charcot arthropathy     Plan:  Patient was evaluated and treated and all questions answered.  Left foot Charcot deformity with underlying hyperkeratotic lesion at the subcuboid -All questions and concerns were discussed with the patient in extensive detail -Clinically healed and officially discharged from my care if any foot and ankle issues or in the future she will come back and see me.  She will discontinue cam boot and return to work next week.  No follow-ups on file.

## 2023-01-21 ENCOUNTER — Ambulatory Visit: Payer: 59 | Admitting: Podiatry

## 2023-01-31 ENCOUNTER — Other Ambulatory Visit: Payer: Self-pay | Admitting: Student

## 2023-01-31 DIAGNOSIS — E1165 Type 2 diabetes mellitus with hyperglycemia: Secondary | ICD-10-CM

## 2023-02-18 ENCOUNTER — Other Ambulatory Visit: Payer: Self-pay | Admitting: Student

## 2023-02-18 DIAGNOSIS — E118 Type 2 diabetes mellitus with unspecified complications: Secondary | ICD-10-CM

## 2023-02-18 DIAGNOSIS — I10 Essential (primary) hypertension: Secondary | ICD-10-CM

## 2023-03-03 ENCOUNTER — Other Ambulatory Visit: Payer: Self-pay | Admitting: Student

## 2023-03-03 DIAGNOSIS — E1165 Type 2 diabetes mellitus with hyperglycemia: Secondary | ICD-10-CM

## 2023-03-03 MED ORDER — EMPAGLIFLOZIN 25 MG PO TABS: 25.0000 mg | ORAL_TABLET | Freq: Every day | ORAL | 0 refills | Status: DC

## 2023-03-11 ENCOUNTER — Other Ambulatory Visit: Payer: Self-pay | Admitting: Student

## 2023-03-11 DIAGNOSIS — E118 Type 2 diabetes mellitus with unspecified complications: Secondary | ICD-10-CM

## 2023-03-13 ENCOUNTER — Encounter: Payer: Self-pay | Admitting: Student

## 2023-03-13 ENCOUNTER — Ambulatory Visit: Payer: 59 | Admitting: Student

## 2023-03-13 VITALS — BP 155/87 | HR 97 | Ht 66.0 in | Wt 222.6 lb

## 2023-03-13 DIAGNOSIS — I10 Essential (primary) hypertension: Secondary | ICD-10-CM

## 2023-03-13 DIAGNOSIS — Z23 Encounter for immunization: Secondary | ICD-10-CM

## 2023-03-13 DIAGNOSIS — E118 Type 2 diabetes mellitus with unspecified complications: Secondary | ICD-10-CM

## 2023-03-13 DIAGNOSIS — E11628 Type 2 diabetes mellitus with other skin complications: Secondary | ICD-10-CM | POA: Diagnosis not present

## 2023-03-13 LAB — POCT GLYCOSYLATED HEMOGLOBIN (HGB A1C): HbA1c, POC (controlled diabetic range): 7.3 % — AB (ref 0.0–7.0)

## 2023-03-13 MED ORDER — ZOSTER VAC RECOMB ADJUVANTED 50 MCG/0.5ML IM SUSR
0.5000 mL | Freq: Once | INTRAMUSCULAR | 0 refills | Status: AC
Start: 2023-03-13 — End: 2023-03-13

## 2023-03-13 NOTE — Patient Instructions (Addendum)
It was great to see you! Thank you for allowing me to participate in your care!  I recommend that you always bring your medications to each appointment as this makes it easy to ensure you are on the correct medications and helps [place] not miss when refills are needed.  Our plans for today:  - Start taking amlodipine in addition to irbesartan - cut out soda and decrease sweets. Eat more lean meats and green veggies, increase physical activity  - get shingrix vaccine at pharmacy  - return in 3 months for next A1c  We are checking some labs today, I will call you if they are abnormal will send you a MyChart message or a letter if they are normal.  If you do not hear about your labs in the next 2 weeks please let us know.  Take care and seek immediate care sooner if you develop any concerns.   Dr. Erick Alley, DO [facility]

## 2023-03-13 NOTE — Progress Notes (Cosign Needed)
    SUBJECTIVE:   CHIEF COMPLAINT / HPI:   T2DM Currently taking Jardiance 25 mg daily, Ozempic 2 mg weekly  A1c today of 7.3, stable from 7.4 in 06/2022.  With the holiday season has not been following diabetic diet, eating lots of sweets and drinking soda  HTN Never started Amlodipine which was previously prescribed although still has the pills at home.  Is still taking Irbesartan 150 mg daily. Has been checking BP at home - running in 140's/80's    PERTINENT  PMH / PSH: T2DM, HTN  OBJECTIVE:   BP (!) 155/87   Pulse 97   Ht 5\' 6"  (1.676 m)   Wt 222 lb 9.6 oz (101 kg)   SpO2 100%   BMI 35.93 kg/m    General: NAD, pleasant, able to participate in exam Cardiac: RRR, no murmurs. Respiratory: Breathing comfortably on room air Neuro: alert, no obvious focal deficits Psych: Normal affect and mood  ASSESSMENT/PLAN:   HYPERTENSION, BENIGN SYSTEMIC Uncontrolled but patient has not been taking amlodipine which was previously prescribed.  She also plans on increasing physical activity and eating better which will help lower blood pressure. 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  Shared decision making used, patient prefers to stay on current regimen and work on lifestyle modification -Continue Jardiance 25 mg daily and Ozempic 2 mg weekly -Follow diabetic diet (discussed), increase physical activity -Return in 3 months for next A1c     Dr. Erick Alley, DO [facility]

## 2023-03-13 NOTE — Assessment & Plan Note (Addendum)
Slightly over goal at 7.3.  Shared decision making used, patient prefers to stay on current regimen and work on lifestyle modification -Continue Jardiance 25 mg daily and Ozempic 2 mg weekly -Follow diabetic diet (discussed), increase physical activity -Return in 3 months for next A1c

## 2023-03-13 NOTE — Assessment & Plan Note (Signed)
Uncontrolled but patient has not been taking amlodipine which was previously prescribed.  She also plans on increasing physical activity and eating better which will help lower blood pressure. -Continue irbesartan 150 mg daily -Start amlodipine 5 mg daily -Return in 2 to 3 weeks for RN BP check

## 2023-04-01 ENCOUNTER — Other Ambulatory Visit: Payer: Self-pay | Admitting: Student

## 2023-04-01 DIAGNOSIS — E1165 Type 2 diabetes mellitus with hyperglycemia: Secondary | ICD-10-CM

## 2023-04-02 LAB — OPHTHALMOLOGY REPORT-SCANNED

## 2023-04-30 LAB — OPHTHALMOLOGY REPORT-SCANNED

## 2023-05-14 ENCOUNTER — Ambulatory Visit: Payer: 59 | Admitting: Student

## 2023-05-14 ENCOUNTER — Encounter: Payer: Self-pay | Admitting: Student

## 2023-05-14 VITALS — BP 151/80 | HR 95 | Temp 98.6°F | Ht 66.0 in | Wt 222.6 lb

## 2023-05-14 DIAGNOSIS — I10 Essential (primary) hypertension: Secondary | ICD-10-CM

## 2023-05-14 DIAGNOSIS — R059 Cough, unspecified: Secondary | ICD-10-CM

## 2023-05-14 LAB — POC SOFIA 2 FLU + SARS ANTIGEN FIA
Influenza A, POC: NEGATIVE
Influenza B, POC: NEGATIVE
SARS Coronavirus 2 Ag: NEGATIVE

## 2023-05-14 MED ORDER — GUAIFENESIN ER 600 MG PO TB12: 600.0000 mg | ORAL_TABLET | Freq: Two times a day (BID) | ORAL | 0 refills | Status: AC

## 2023-05-14 NOTE — Assessment & Plan Note (Signed)
 BP today elevated x 2.  Missed her morning dose for BP.  Patient will check her blood pressure daily and encouraged to take her medications as prescribed.  If remains elevated will follow-up with PCP for blood pressure management.

## 2023-05-14 NOTE — Progress Notes (Signed)
    SUBJECTIVE:   CHIEF COMPLAINT / HPI:   53 year old female presenting due to ongoing illness.  Symptoms started with bodyaches and cough on Monday after work.  Other associated symptoms include sore throat, generalized fatigue and runny nose.  Has tried over-the-counter medication provided mild relief.  No fevers but reports having night sweats and chills.  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  PERTINENT  PMH / PSH: reviewed  OBJECTIVE:   BP (!) 151/80   Pulse 95   Temp 98.6 F (37 C)   Ht 5\' 6"  (1.676 m)   Wt 222 lb 9.6 oz (101 kg)   SpO2 100%   BMI 35.93 kg/m    Physical Exam General: Alert, well appearing, NAD HEENT: Normal TM bilaterally, oropharyngeal erythema present, no tonsillar exudate. Cardiovascular: RRR, No Murmurs, Normal S2/S2 Respiratory: CTAB, No wheezing or Rales Abdomen: No distension or tenderness Extremities: No edema on extremities    ASSESSMENT/PLAN:   HYPERTENSION, BENIGN SYSTEMIC BP today elevated x 2.  Missed her morning dose for BP.  Patient will check her blood pressure daily and encouraged to take her medications as prescribed.  If remains elevated will follow-up with PCP for blood pressure management.   Viral illness 53 y.o. year old presents with cough, congestion, rhinorrhea and sore throat. She is afebrile today and on exam has oropharyngeal erythema,  good work of breathing on RA and clear breath sounds bilaterally. Overall presentation and exam is consistent with viral URI.   - Discussed conservative management with warm water, honey and lemon. - Recommended tylenol or ibuprofen for fever.  - Rx Mucinex 600 mg twice daily for 5 days - Encouraged adequate hydration for patient.  - Outline signs and symptoms that will warrant ED visit or return for further assessment.   Jerre Simon, MD [facility]

## 2023-05-14 NOTE — Patient Instructions (Addendum)
 Pleasure to meet you today.  Suspect your symptoms are most likely due to ongoing viral upper respiratory illness   Your flu and COVID test today were negative.  I recommend making sure you stay hydrated with enough water.  Also you can do Tylenol or ibuprofen for any aches and pain with the cough.  Courage use of warm water, honey and lemon to help with the cough.  Have sent in prescription for Mucinex which you should take twice daily for 5 days.  Use the nasal saline provided to you today to irrigate your nose to help with the runny nose.

## 2023-06-01 ENCOUNTER — Other Ambulatory Visit: Payer: Self-pay | Admitting: Student

## 2023-06-01 DIAGNOSIS — E118 Type 2 diabetes mellitus with unspecified complications: Secondary | ICD-10-CM

## 2023-06-05 LAB — OPHTHALMOLOGY REPORT-SCANNED

## 2023-06-11 ENCOUNTER — Telehealth: Payer: Self-pay | Admitting: Podiatry

## 2023-06-11 DIAGNOSIS — Z0271 Encounter for disability determination: Secondary | ICD-10-CM

## 2023-06-11 NOTE — Telephone Encounter (Signed)
 S/w pt at [phone] and she adv intermittent FMLA needs to be renewed. She said still about the same on flare-ups 2-3 days per week and 8-12 hours. I advised would fax [facility] [phone]

## 2023-06-17 ENCOUNTER — Telehealth: Payer: Self-pay | Admitting: Podiatry

## 2023-06-17 NOTE — Telephone Encounter (Signed)
 s/w pt in ref to call she recd from [employer] about forms. I called [employer] and s/w Dawn and she advised needed to check Chronic/permanent. I adv would correct and fax back to [phone]. I called pt back to adv it was taken care of.

## 2023-07-10 ENCOUNTER — Ambulatory Visit: Admitting: Student

## 2023-07-21 ENCOUNTER — Encounter: Payer: Self-pay | Admitting: Student

## 2023-07-21 ENCOUNTER — Ambulatory Visit (INDEPENDENT_AMBULATORY_CARE_PROVIDER_SITE_OTHER): Admitting: Student

## 2023-07-21 VITALS — BP 130/74 | HR 91 | Ht 66.0 in | Wt 221.2 lb

## 2023-07-21 DIAGNOSIS — E118 Type 2 diabetes mellitus with unspecified complications: Secondary | ICD-10-CM | POA: Diagnosis not present

## 2023-07-21 LAB — POCT GLYCOSYLATED HEMOGLOBIN (HGB A1C): HbA1c, POC (controlled diabetic range): 6.9 % (ref 0.0–7.0)

## 2023-07-21 NOTE — Progress Notes (Cosign Needed Addendum)
    SUBJECTIVE:   CHIEF COMPLAINT / HPI:   53 year old female history of type 2 diabetes presenting today for diabetes follow-up.  Last A1c 4 months ago was 7.3.  Patient currently on Jardiance  25 mg daily and Ozempic  2 mg weekly.  Also on Lipitor 40 mg daily and irbesartan  150 mg daily.  Denies any hyperglycemia or hypoglycemic episodes.  Patient expressed concerns for muscle cramps with Jardiance  which she was told by her family member is a known side effect of Jardiance .  Patient says she briefly discontinued the medication however cramps continued.  PERTINENT  PMH / PSH: Reviewed   OBJECTIVE:   BP 130/74   Pulse 91   Ht 5\' 6"  (1.676 m)   Wt 221 lb 3.2 oz (100.3 kg)   SpO2 99%   BMI 35.70 kg/m    Physical Exam General: Alert, well appearing, NAD Cardiovascular: RRR, No Murmurs, Normal S2/S2 Respiratory: CTAB, No wheezing or Rales Abdomen: No distension or tenderness  ASSESSMENT/PLAN:   DM (diabetes mellitus) (HCC) Well-controlled diabetes A1c of 6.9% today. Annual Foot exam declined today with plans to be completed at next follow up appointment.  Patient's Jardiance  usually does not directly cause muscle cramps as if this patient is slightly dehydrated and given that cramps continued despite discontinuing the medication briefly could suggest other causes for the cramps. No cramps recently and patient agrees to continue with Jardiance  for now and could consider switching back to metformin  although it has caused diarrhea in the past but could be beneficial given problems with constipation on Ozempic . - Obtain A1c - Unable to get urine today for microalbuminuria, will complete at next DM follow up appointment  - Encouraged to continue eating and exercise - Annual Foot exam to be completed at next appointment  - Follow up in 3-4 months     Goble Last, MD [facility]

## 2023-07-21 NOTE — Patient Instructions (Addendum)
 Pleasure to meet you today.  Your A1c today was 6.9% and improved   Please continue to take your medications as prescribed.  Make sure to continue exercising and working on appropriate dieting.  You for your annual diabetes eye exam please make sure to complete that as soon as possible and send the notes to the clinic.  You can get the Shingrix vaccine at the pharmacy. You will need a 2nd shot 2-6 months after the first. This vaccine is important to help prevent shingles.

## 2023-07-21 NOTE — Assessment & Plan Note (Signed)
 Well-controlled diabetes A1c of 6.9% today. Annual Foot exam declined today with plans to be completed at next follow up appointment.  Patient's Jardiance  usually does not directly cause muscle cramps as if this patient is slightly dehydrated and given that cramps continued despite discontinuing the medication briefly could suggest other causes for the cramps. No cramps recently and patient agrees to continue with Jardiance  for now and could consider switching back to metformin  although it has caused diarrhea in the past but could be beneficial given problems with constipation on Ozempic . - Obtain A1c - Unable to get urine today for microalbuminuria, will complete at next DM follow up appointment  - Encouraged to continue eating and exercise - Annual Foot exam to be completed at next appointment  - Follow up in 3-4 months

## 2023-07-31 LAB — OPHTHALMOLOGY REPORT-SCANNED

## 2023-08-14 ENCOUNTER — Ambulatory Visit: Admitting: Student

## 2023-08-14 LAB — OPHTHALMOLOGY REPORT-SCANNED

## 2023-08-16 ENCOUNTER — Other Ambulatory Visit: Payer: Self-pay | Admitting: Student

## 2023-08-16 DIAGNOSIS — I10 Essential (primary) hypertension: Secondary | ICD-10-CM

## 2023-08-16 DIAGNOSIS — E118 Type 2 diabetes mellitus with unspecified complications: Secondary | ICD-10-CM

## 2023-08-19 ENCOUNTER — Other Ambulatory Visit: Payer: Self-pay | Admitting: Student

## 2023-08-19 DIAGNOSIS — Z1231 Encounter for screening mammogram for malignant neoplasm of breast: Secondary | ICD-10-CM

## 2023-08-20 ENCOUNTER — Other Ambulatory Visit: Payer: Self-pay | Admitting: Student

## 2023-08-20 DIAGNOSIS — E118 Type 2 diabetes mellitus with unspecified complications: Secondary | ICD-10-CM

## 2023-08-25 ENCOUNTER — Encounter: Payer: Self-pay | Admitting: *Deleted

## 2023-08-27 ENCOUNTER — Ambulatory Visit: Admitting: Student

## 2023-08-27 ENCOUNTER — Encounter: Payer: Self-pay | Admitting: Student

## 2023-08-27 VITALS — BP 150/72 | HR 98 | Ht 66.0 in | Wt 220.6 lb

## 2023-08-27 DIAGNOSIS — M898X8 Other specified disorders of bone, other site: Secondary | ICD-10-CM

## 2023-08-27 DIAGNOSIS — I10 Essential (primary) hypertension: Secondary | ICD-10-CM | POA: Diagnosis not present

## 2023-08-27 DIAGNOSIS — E118 Type 2 diabetes mellitus with unspecified complications: Secondary | ICD-10-CM

## 2023-08-27 NOTE — Patient Instructions (Addendum)
 It was great to see you! Thank you for allowing me to participate in your care!  I recommend that you always bring your medications to each appointment as this makes it easy to ensure you are on the correct medications and helps us  not miss when refills are needed.  Our plans for today:  - Return in August for diabetic follow up - You will be called to schedule CT can to evaluate the mass  - increase amlodipine  to 10 mg daily, this has been sent to your pharmacy - Schedule appointment in the next 2 to 3 weeks for BP check  We are checking some labs today, I will call you if they are abnormal will send you a MyChart message or a letter if they are normal.  If you do not hear about your labs in the next 2 weeks please let us  know.  Take care and seek immediate care sooner if you develop any concerns.   Dr. Glenn Lange, DO [facility]

## 2023-08-27 NOTE — Progress Notes (Signed)
    SUBJECTIVE:   CHIEF COMPLAINT / HPI:   Virginia [name] is a 53 year old female who presents with a hard, nodular mass near her collarbone.  The mass has been present since the end of March 2025, is non-tender, and has not increased in size. There are no associated symptoms such as weight loss, night sweats, or fevers, and no changes in the mass over the past two and a half months. She is a former smoker, having quit approximately twenty years ago.  She is feeling well today.  Her medical history includes hypertension, managed with amlodipine  and irbesartan  taken in the morning. She confirmed taking these medications today and does not monitor her blood pressure at home.    PERTINENT  PMH / PSH: History of tobacco use, HTN, obesity, HLD  OBJECTIVE:   BP (!) 150/72   Pulse 98   Ht 5' 6 (1.676 m)   Wt 220 lb 9.6 oz (100.1 kg)   SpO2 96%   BMI 35.61 kg/m    General: NAD, pleasant, well-appearing Neck: No anterior cervical lymphadenopathy, no obvious thyroid  masses/nodules, point where sternum meets right collarbone feels significantly larger when compared to the left side, feels hard/nodular and is nontender Cardiac: RRR, no murmurs. Respiratory: CTAB, normal effort, No wheezes, rales or rhonchi Skin: warm and dry Neuro: alert, no obvious focal deficits Psych: Normal affect and mood  ASSESSMENT/PLAN:   Sternal mass Hard, nodular mass top of sternum/right collarbone for 2.5 months. Non-tender, stable size, no systemic symptoms. Differential includes bony changes, thyroid  nodules, or malignancy. Further evaluation needed to rule out bone cancer given age and smoking history  - Order CT scan of neck with contrast.  - Obtain BMP assess kidney function prior to CT scan.   HYPERTENSION, BENIGN SYSTEMIC Blood pressure 158/72 mmHg. Managed with amlodipine  and irbesartan .   - Increase amlodipine  to 10 mg daily, continue irbesartan  150 mg daily - Schedule follow-up in 2-3 weeks to  recheck blood pressure.       Dr. Glenn Lange, DO [facility]

## 2023-08-28 ENCOUNTER — Ambulatory Visit: Payer: Self-pay | Admitting: Student

## 2023-08-28 DIAGNOSIS — K118 Other diseases of salivary glands: Secondary | ICD-10-CM

## 2023-08-28 DIAGNOSIS — M898X8 Other specified disorders of bone, other site: Secondary | ICD-10-CM | POA: Insufficient documentation

## 2023-08-28 LAB — BASIC METABOLIC PANEL WITH GFR
BUN/Creatinine Ratio: 23 (ref 9–23)
BUN: 22 mg/dL (ref 6–24)
CO2: 21 mmol/L (ref 20–29)
Calcium: 9.4 mg/dL (ref 8.7–10.2)
Chloride: 105 mmol/L (ref 96–106)
Creatinine, Ser: 0.95 mg/dL (ref 0.57–1.00)
Glucose: 95 mg/dL (ref 70–99)
Potassium: 4.7 mmol/L (ref 3.5–5.2)
Sodium: 142 mmol/L (ref 134–144)
eGFR: 72 mL/min/{1.73_m2} (ref >59)

## 2023-08-28 NOTE — Assessment & Plan Note (Addendum)
 Hard, nodular mass top of sternum/right collarbone for 2.5 months. Non-tender, stable size, no systemic symptoms. Differential includes bony changes, thyroid  nodules, or malignancy. Further evaluation needed to rule out bone cancer given age and smoking history  - Order CT scan of neck with contrast.  - Obtain BMP assess kidney function prior to CT scan.

## 2023-08-28 NOTE — Assessment & Plan Note (Signed)
 Blood pressure 158/72 mmHg. Managed with amlodipine  and irbesartan .   - Increase amlodipine  to 10 mg daily, continue irbesartan  150 mg daily - Schedule follow-up in 2-3 weeks to recheck blood pressure.

## 2023-09-08 ENCOUNTER — Ambulatory Visit
Admission: RE | Admit: 2023-09-08 | Discharge: 2023-09-08 | Disposition: A | Discharge status: Home / Self Care | Source: Ambulatory Visit | Attending: Neurology | Admitting: Neurology

## 2023-09-08 DIAGNOSIS — Z1231 Encounter for screening mammogram for malignant neoplasm of breast: Secondary | ICD-10-CM

## 2023-09-09 ENCOUNTER — Ambulatory Visit: Admitting: Student

## 2023-09-11 ENCOUNTER — Encounter: Payer: Self-pay | Admitting: Podiatry

## 2023-09-11 ENCOUNTER — Ambulatory Visit (HOSPITAL_COMMUNITY)
Admission: RE | Admit: 2023-09-11 | Discharge: 2023-09-11 | Disposition: A | Discharge status: Home / Self Care | Source: Ambulatory Visit | Attending: Family Medicine | Admitting: Family Medicine

## 2023-09-11 ENCOUNTER — Ambulatory Visit: Admitting: Podiatry

## 2023-09-11 DIAGNOSIS — M898X8 Other specified disorders of bone, other site: Secondary | ICD-10-CM | POA: Insufficient documentation

## 2023-09-11 DIAGNOSIS — E1165 Type 2 diabetes mellitus with hyperglycemia: Secondary | ICD-10-CM

## 2023-09-11 DIAGNOSIS — M1460 Charcot's joint, unspecified site: Secondary | ICD-10-CM

## 2023-09-11 MED ORDER — SODIUM CHLORIDE (PF) 0.9 % IJ SOLN
INTRAMUSCULAR | Status: AC
  Filled 2023-09-11: qty 50

## 2023-09-11 MED ORDER — IOHEXOL 300 MG/ML  SOLN
75.0000 mL | Freq: Once | INTRAMUSCULAR | Status: AC | PRN
  Administered 2023-09-11: 75 mL via INTRAVENOUS

## 2023-09-11 NOTE — Progress Notes (Unsigned)
  Subjective:  Patient ID: Virginia [name], female    DOB: 05-Sep-1970,  MRN: [id number]  Chief Complaint  Patient presents with   Diabetes    Pt stated that she would like to get some diabetic inserts    53 y.o. female returns for the above complaint.  Patient presents with follow-up for bilateral flatfoot deformity/Charcot deformity.  Patient would like to get new sets of diabetic inserts.  Previous pair are getting worn out.  Denies any other acute complaints  Objective:  There were no vitals filed for this visit. Podiatric Exam: Vascular: dorsalis pedis and posterior tibial pulses are palpable bilateral. Capillary return is immediate. Temperature gradient is WNL. Skin turgor WNL  Sensorium: Decreased Semmes Weinstein monofilament test.  Decreased actile sensation bilaterally. Nail Exam: Pt has thick disfigured discolored nails with subungual debris noted bilateral entire nail second through fifth toenails. Pain on palpation to the nails.  X8 Ulcer Exam: There is no evidence of ulcer or pre-ulcerative changes or infection. Orthopedic Exam: Muscle tone and strength are WNL. No limitations in general ROM. No crepitus or effusions noted.  Skin: No Porokeratosis. No infection or ulcers    Assessment & Plan:   No diagnosis found.   Patient was evaluated and treated and all questions answered.  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  Patient was scheduled to see Trish for orthotics.  Please make a pair similar to the previous one   Onychomycosis with pain  -Nails palliatively debrided as below. -Educated on self-care  Procedure: Nail Debridement Rationale: pain  Type of Debridement: manual, sharp debridement. Instrumentation: Nail nipper, rotary burr. Number of Nails: 8  Procedures and Treatment: Consent by  patient was obtained for treatment procedures. The patient understood the discussion of treatment and procedures well. All questions were answered thoroughly reviewed. Debridement of mycotic and hypertrophic toenails, 1 through 5 bilateral and clearing of subungual debris. No ulceration, no infection noted.  Return Visit-Office Procedure: Patient instructed to return to the office for a follow up visit 3 months for continued evaluation and treatment.  Franky Blanch, DPM    No follow-ups on file.

## 2023-09-15 ENCOUNTER — Other Ambulatory Visit: Payer: Self-pay | Admitting: Student

## 2023-09-15 DIAGNOSIS — E1165 Type 2 diabetes mellitus with hyperglycemia: Secondary | ICD-10-CM

## 2023-09-21 ENCOUNTER — Telehealth: Payer: Self-pay

## 2023-09-21 NOTE — Telephone Encounter (Signed)
 Received call from [facility] regarding CT Soft Tissue Neck. See below impression.   IMPRESSION: 1. No abnormality identified in the region of the medial right clavicle or upper sternum. 2. Nodules within the bilateral parotid glands measuring up to 8 mm, as described. These may reflect primary parotid neoplasms or nonspecific mildly enlarged intraparotid lymph nodes. Consider ENT referral. 3. Mild right maxillary sinus mucosal thickening. 4. Aortic Atherosclerosis (ICD10-I70.0). Calcified atherosclerotic plaque also present within the proximal major branch vessels of the neck, about the carotid bifurcations (right greater than left) and within the intracranial internal carotid arteries. 5. Cervical spondylosis.  Forwarding to PCP. Ordering provider Bonney) no longer in office. Please let me know if I need to route to someone else.   Virginia JAYSON English, RN

## 2023-09-21 NOTE — Telephone Encounter (Signed)
 Called patient with CT results. Referred to ENT for parotid and parathyroid findings. Has had normal calcium .  Suzann Daring, MD  Family Medicine Teaching Service

## 2023-09-23 ENCOUNTER — Encounter (INDEPENDENT_AMBULATORY_CARE_PROVIDER_SITE_OTHER): Payer: Self-pay

## 2023-09-29 NOTE — Telephone Encounter (Signed)
 Verified referral with Reena Dimes, CMA:  [facility] [address], [place], [place] [address] [phone]  Called patient, verified DOB.  Discussed results, possible benign lymph node enlargement versus neoplasm, but unclear at this time. Advised to call provided ENT phone number and schedule appointment. Patient will call back [facility] if she encounters any difficulty with this.

## 2023-10-08 ENCOUNTER — Ambulatory Visit

## 2023-10-08 NOTE — Progress Notes (Signed)
 Patient still owing $330 of $500 ded and wants to wait until it is closer to being met I have impressions under my desk and will hold until end of the year  Inserts should be (Anodyne) cork base offload charcot area of left foot NO toe fillers patient does not like them  Shoe SZ 10WD women's  Patient will call when ready to move forward  Patient will drop off old pair for refurbish  Patient is aware of $90 fee for refurbish  Virginia [name] CPed, CFo, CFm

## 2023-10-09 ENCOUNTER — Encounter (INDEPENDENT_AMBULATORY_CARE_PROVIDER_SITE_OTHER): Payer: Self-pay | Admitting: Otolaryngology

## 2023-10-15 ENCOUNTER — Other Ambulatory Visit: Payer: Self-pay

## 2023-10-15 DIAGNOSIS — I10 Essential (primary) hypertension: Secondary | ICD-10-CM

## 2023-10-15 MED ORDER — AMLODIPINE BESYLATE 5 MG PO TABS
5.0000 mg | ORAL_TABLET | Freq: Every day | ORAL | 3 refills | Status: DC
Start: 2023-10-15 — End: 2024-01-25

## 2023-10-30 ENCOUNTER — Ambulatory Visit (INDEPENDENT_AMBULATORY_CARE_PROVIDER_SITE_OTHER): Admitting: Otolaryngology

## 2023-10-30 ENCOUNTER — Encounter (INDEPENDENT_AMBULATORY_CARE_PROVIDER_SITE_OTHER): Payer: Self-pay | Admitting: Otolaryngology

## 2023-10-30 VITALS — BP 154/81 | HR 92

## 2023-10-30 DIAGNOSIS — E042 Nontoxic multinodular goiter: Secondary | ICD-10-CM | POA: Diagnosis not present

## 2023-10-30 DIAGNOSIS — K118 Other diseases of salivary glands: Secondary | ICD-10-CM

## 2023-10-30 NOTE — Progress Notes (Signed)
 ENT CONSULT:  Reason for Consult: parotid nodules   HPI: Discussed the use of AI scribe software for clinical note transcription with the patient, who gave verbal consent to proceed.  History of Present Illness Virginia [name] is a 53 year old female who presents with parotid and thyroid nodules identified during a neck CT done to assess clavicular area.   A neck scan revealed two very small parotid nodules under 1 cm in size. These nodules were incidentally noted. No history of skin cancer. No facial movement issues, numbness, or tingling.  The neck scan also identified multiple small thyroid nodules. She has a smoking history from approximately thirty years ago but has not smoked since then. No skin cancer hx.    Records Reviewed:  [facility] note Dr Tobie 09/11/23  53 y.o. female returns for the above complaint.  Patient presents with follow-up for bilateral flatfoot deformity/Charcot deformity.  Patient would like to get new sets of diabetic inserts.  Previous pair are getting worn out.  Denies any other acute complaints   Patient was evaluated and treated and all questions answered.   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  Patient was scheduled to see Trish for orthotics.  Please make a pair similar to the previous one     Onychomycosis with pain  -Nails palliatively debrided as below. -Educated on self-care   Past Medical History:  Diagnosis Date   Diabetes mellitus    Hypertension    on HCTZ   Obesity    Wears glasses     Past Surgical History:  Procedure Laterality Date   AMPUTATION Right 11/18/2020   Procedure: AMPUTATION HALLUX TOE;  Surgeon: Tobie Franky SQUIBB, DPM;  Location: [facility] OR;  Service: Podiatry;  Laterality: Right;   CHOLECYSTECTOMY     IR RADIOLOGIST EVAL & MGMT  04/19/2019    SHOULDER ARTHROSCOPY WITH ROTATOR CUFF REPAIR AND SUBACROMIAL DECOMPRESSION Left 06/03/2013   Procedure: LEFT SHOULDER ARTHROSCOPY DEBRIDEMENT EXTENTSIVE/SUBACROMIAL DECOMPRESSION/PARTIAL ACROMIOPLASTY DISTAL CLAVICLE RESECTION WITH CORACOACROMIAL RELEASE/ ROTATOR CUFF REPAIR ;  Surgeon: Fonda SQUIBB Olmsted, MD;  Location: [facility];  Service: Orthopedics;  Laterality: Left;  ANESTHESIA: GENERAL, PRE/POST OP SCALENE   TUBAL LIGATION      Family History  Problem Relation Age of Onset   Diabetes Mother    Heart disease Mother    Hypertension Mother    Hyperlipidemia Mother    Cancer Mother    Breast cancer Mother    Diabetes Father    Heart disease Father    Hypertension Father    Hyperlipidemia Father    Breast cancer Maternal Aunt    Breast cancer Maternal Aunt    Breast cancer Maternal Grandmother     Social History:  reports that she quit smoking about 25 years ago. Her smoking use included cigarettes. She started smoking about 32 years ago. She has a 1.8 pack-year smoking history. She has never used smokeless tobacco. She reports that she does not drink alcohol and does not use drugs.  Allergies:  Allergies  Allergen Reactions   Liraglutide Nausea And Vomiting    Projectile Vomiting   Shrimp [Shellfish Allergy] Shortness Of Breath   Ace Inhibitors Cough   Metformin And Related Diarrhea and Nausea And Vomiting   Rosuvastatin Other (See Comments)    Myalgia - Legs reported -  resolved upon discontinuation.     Medications: I have reviewed the patient's current medications.  The PMH, PSH, Medications, Allergies, and SH were reviewed and updated.  ROS: Constitutional: Negative for fever, weight loss and weight gain. Cardiovascular: Negative for chest pain and dyspnea on exertion. Respiratory: Is not experiencing shortness of breath at rest. Gastrointestinal: Negative for nausea and vomiting. Neurological: Negative for headaches. Psychiatric: The patient is not  nervous/anxious  Blood pressure (!) 154/81, pulse 92, SpO2 97%. There is no height or weight on file to calculate BMI.  PHYSICAL EXAM:  Exam: General: Well-developed, well-nourished Respiratory Respiratory effort: Equal inspiration and expiration without stridor Cardiovascular Peripheral Vascular: Warm extremities with equal color/perfusion Eyes: No nystagmus with equal extraocular motion bilaterally Neuro/Psych/Balance: Patient oriented to person, place, and time; Appropriate mood and affect; Gait is intact with no imbalance; Cranial nerves I-XII are intact Head and Face Inspection: Normocephalic and atraumatic without mass or lesion Palpation: Facial skeleton intact without bony stepoffs Salivary Glands: No mass or tenderness  Facial Strength: Facial motility symmetric and full bilaterally ENT Pinna: External ear intact and fully developed External canal: Canal is patent with intact skin Tympanic Membrane: Clear and mobile External Nose: No scar or anatomic deformity Internal Nose: Septum is relatively straight on anterior rhinoscopy. Bilateral inferior turbinate hypertrophy.  Lips, Teeth, and gums: Mucosa and teeth intact and viable Oral cavity/oropharynx: No erythema or exudate, no lesions present Neck Neck and Trachea: Midline trachea without mass or lesion Thyroid: No mass or nodularity Lymphatics: No lymphadenopathy   Studies Reviewed: CT neck 09/11/23 IMPRESSION: 1. No abnormality identified in the region of the medial right clavicle or upper sternum. 2. Nodules within the bilateral parotid glands measuring up to 8 mm, as described. These may reflect primary parotid neoplasms or nonspecific mildly enlarged intraparotid lymph nodes. Consider ENT referral. 3. Mild right maxillary sinus mucosal thickening. 4. Aortic Atherosclerosis (ICD10-I70.0). Calcified atherosclerotic plaque also present within the proximal major branch vessels of the neck, about the carotid  bifurcations (right greater than left) and within the intracranial internal carotid arteries. 5. Cervical spondylosis.   Assessment/Plan: Encounter Diagnoses  Name Primary?   Parotid mass Yes   Multiple thyroid nodules     Assessment and Plan Assessment & Plan Parotid gland nodules b/l under 1 cm in size  Two small parotid nodules likely incidental and benign. No facial weakness, no paresthesias. Biopsy unlikely to be feasible due to size. No skin chancer hx but she has a remote hx of smoking. These could represent Warthin's tumor vs other primary parotid neoplasm - Order MRI neck to be done in 12 months to monitor for growth   Thyroid nodules Multiple small thyroid nodules, based on size do not require surveillance or imaging. We discussed etiology and natural hx.  - No immediate follow-up needed based on current size and characteristics. - will consider thyroid U/S next year  RTC 1 year after neck MRI    Thank you for allowing me to participate in the care of this patient. Please do not hesitate to contact me with any questions or concerns.   Elena Larry, MD Otolaryngology [facility] Phone: [phone] Fax: [phone]    10/30/2023, 10:34 AM

## 2023-11-02 ENCOUNTER — Other Ambulatory Visit: Payer: Self-pay | Admitting: Family Medicine

## 2023-11-02 DIAGNOSIS — E1165 Type 2 diabetes mellitus with hyperglycemia: Secondary | ICD-10-CM

## 2023-11-05 ENCOUNTER — Other Ambulatory Visit: Payer: Self-pay | Admitting: *Deleted

## 2023-11-05 DIAGNOSIS — E782 Mixed hyperlipidemia: Secondary | ICD-10-CM

## 2023-11-05 DIAGNOSIS — E118 Type 2 diabetes mellitus with unspecified complications: Secondary | ICD-10-CM

## 2023-11-05 DIAGNOSIS — I10 Essential (primary) hypertension: Secondary | ICD-10-CM

## 2023-11-05 MED ORDER — IRBESARTAN 150 MG PO TABS: 150.0000 mg | ORAL_TABLET | Freq: Every day | ORAL | 0 refills | Status: DC

## 2023-11-05 MED ORDER — ATORVASTATIN CALCIUM 40 MG PO TABS
40.0000 mg | ORAL_TABLET | Freq: Every day | ORAL | 3 refills | Status: AC
Start: 2023-11-05 — End: ?

## 2023-11-10 ENCOUNTER — Other Ambulatory Visit: Payer: Self-pay | Admitting: *Deleted

## 2023-11-10 DIAGNOSIS — E118 Type 2 diabetes mellitus with unspecified complications: Secondary | ICD-10-CM

## 2023-11-10 MED ORDER — OZEMPIC (2 MG/DOSE) 8 MG/3ML ~~LOC~~ SOPN: 2.0000 mg | PEN_INJECTOR | SUBCUTANEOUS | 0 refills | Status: DC

## 2023-12-30 ENCOUNTER — Telehealth: Payer: Self-pay | Admitting: Podiatry

## 2023-12-30 NOTE — Telephone Encounter (Signed)
 pt lft mess to adv new forms for her fmla. I called her bk and she will leave forms and pay $25 fee on 12/31/23

## 2023-12-31 DIAGNOSIS — Z0279 Encounter for issue of other medical certificate: Secondary | ICD-10-CM

## 2024-01-04 ENCOUNTER — Telehealth: Payer: Self-pay | Admitting: Podiatry

## 2024-01-04 NOTE — Telephone Encounter (Signed)
 pt lft mess to adv intermittent leave. I called and confirmed 2-4 days per week /8 hrs per episode and adv I would email her copy for her records

## 2024-01-04 NOTE — Telephone Encounter (Signed)
 lft mess fo pt on her vmail to confirm intermittent leave and I let her know I would take it out till 03/16/24 and start it today. I req she vrf her email address and I can email her what I fax for her records.

## 2024-01-07 NOTE — Progress Notes (Unsigned)
    SUBJECTIVE:   CHIEF COMPLAINT: diabetes HPI:   Virginia [name] is a 53 y.o.  with history notable for HTN, type 2 DM presenting for follow up.   Discussed the use of AI scribe software for clinical note transcription with the patient, who gave verbal consent to proceed.  History of Present Illness Virginia [name] is a 53 year old female with hypertension and diabetes who presents for a follow-up visit.  Hypertension - Elevated blood pressure over the past few days - Recent missed doses of antihypertensive medication (amlodipine  5 mg, Avapro  150 mg) - Increased psychosocial stress due to aunt's hospitalization and demanding work schedule (twelve-hour shifts, up to seven days a week) - No chest pain, shortness of breath, or palpitations  Diabetes mellitus type 2 - Managed with Jardiance  and Ozempic  - Most recent hemoglobin A1c is 7.1 - Occasional constipation associated with Ozempic  use - No nausea or other gastrointestinal side effects    PERTINENT  PMH / PSH/Family/Social History : amputation, Charcot food   OBJECTIVE:   BP (!) 172/77   Pulse 93   Ht 5' 6 (1.676 m)   Wt 225 lb 9.6 oz (102.3 kg)   SpO2 100%   BMI 36.41 kg/m   Today's weight:  Last Weight  Most recent update: 01/08/2024  9:54 AM    Weight  102.3 kg (225 lb 9.6 oz)            Review of prior weights: Filed Weights   01/08/24 0953  Weight: 225 lb 9.6 oz (102.3 kg)     Cardiac: Regular rate and rhythm. Normal S1/S2. No murmurs, rubs, or gallops appreciated. Lungs: Clear bilaterally to ascultation.  Abdomen: Normoactive bowel sounds. No tenderness to deep or light palpation. No rebound or guarding.   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

## 2024-01-08 ENCOUNTER — Encounter: Payer: Self-pay | Admitting: Family Medicine

## 2024-01-08 ENCOUNTER — Ambulatory Visit: Admitting: Family Medicine

## 2024-01-08 ENCOUNTER — Ambulatory Visit: Payer: Self-pay | Admitting: Family Medicine

## 2024-01-08 ENCOUNTER — Telehealth: Payer: Self-pay | Admitting: Podiatry

## 2024-01-08 VITALS — BP 172/77 | HR 93 | Ht 66.0 in | Wt 225.6 lb

## 2024-01-08 DIAGNOSIS — I10 Essential (primary) hypertension: Secondary | ICD-10-CM

## 2024-01-08 DIAGNOSIS — E11628 Type 2 diabetes mellitus with other skin complications: Secondary | ICD-10-CM | POA: Diagnosis not present

## 2024-01-08 LAB — POCT GLYCOSYLATED HEMOGLOBIN (HGB A1C): HbA1c, POC (controlled diabetic range): 7.1 % — AB (ref 0.0–7.0)

## 2024-01-08 NOTE — Assessment & Plan Note (Signed)
 Refilled BP medications (she is out of them)  Follow up 2-3 weeks for BP check, consider repeat BMP She also monitors BP at home

## 2024-01-08 NOTE — Patient Instructions (Signed)
 It was wonderful to see you today.  Please bring ALL of your medications with you to every visit.   Today we talked about:  GREAT WORK WITH YOUR HEALTH   You need a cholesterol panel at your next visit  Set reminders to take your medications each day   Please follow up in 3 months with Dr. Toma  Thank you for choosing [facility].   Please call [phone] with any questions about today's appointment.  Please be sure to schedule follow up at the front  desk before you leave today.   Suzann Daring, MD  Family Medicine

## 2024-01-08 NOTE — Telephone Encounter (Signed)
 Pt lft mess on my vmail that pt 2 needs to indicate chronic. Refaxed [phone]  to [facility]

## 2024-01-08 NOTE — Assessment & Plan Note (Signed)
 Doing well A1C at goal Signed release for eye exam UACR today Due for lipids at follow up (no labs today)

## 2024-01-09 LAB — MICROALBUMIN / CREATININE URINE RATIO
Creatinine, Urine: 139.9 mg/dL
Microalb/Creat Ratio: 10 mg/g{creat} (ref 0–29)
Microalbumin, Urine: 13.3 ug/mL

## 2024-01-22 NOTE — Progress Notes (Signed)
 Correction plan does not have deductible and no coins  Placing order for CFO's  Virginia [name] Cped, CFo, CFm

## 2024-01-25 ENCOUNTER — Ambulatory Visit: Payer: Self-pay | Admitting: Family Medicine

## 2024-01-25 ENCOUNTER — Encounter: Payer: Self-pay | Admitting: Family Medicine

## 2024-01-25 ENCOUNTER — Ambulatory Visit: Admitting: Family Medicine

## 2024-01-25 VITALS — BP 175/84 | HR 97 | Temp 97.9°F | Ht 66.0 in | Wt 228.0 lb

## 2024-01-25 DIAGNOSIS — I10 Essential (primary) hypertension: Secondary | ICD-10-CM | POA: Diagnosis not present

## 2024-01-25 DIAGNOSIS — E785 Hyperlipidemia, unspecified: Secondary | ICD-10-CM

## 2024-01-25 DIAGNOSIS — Z23 Encounter for immunization: Secondary | ICD-10-CM | POA: Diagnosis not present

## 2024-01-25 DIAGNOSIS — R399 Unspecified symptoms and signs involving the genitourinary system: Secondary | ICD-10-CM | POA: Diagnosis not present

## 2024-01-25 DIAGNOSIS — E782 Mixed hyperlipidemia: Secondary | ICD-10-CM | POA: Diagnosis not present

## 2024-01-25 LAB — POCT UA - MICROSCOPIC ONLY
RBC, Urine, Miroscopic: >20 (ref 0–2)
WBC, Ur, HPF, POC: >20 (ref 0–5)

## 2024-01-25 LAB — POCT URINE DIPSTICK
Bilirubin, UA: NEGATIVE
Glucose, UA: >=1000 mg/dL — AB
Ketones, POC UA: NEGATIVE mg/dL
Nitrite, UA: POSITIVE — AB
POC PROTEIN,UA: 100 — AB
Spec Grav, UA: 1.025 (ref 1.010–1.025)
Urobilinogen, UA: 0.2 U/dL
pH, UA: 5.5 (ref 5.0–8.0)

## 2024-01-25 MED ORDER — AMLODIPINE BESYLATE 10 MG PO TABS: 10.0000 mg | ORAL_TABLET | Freq: Every day | ORAL | 0 refills | Status: DC

## 2024-01-25 MED ORDER — NITROFURANTOIN MONOHYD MACRO 100 MG PO CAPS: 100.0000 mg | ORAL_CAPSULE | Freq: Two times a day (BID) | ORAL | 0 refills | Status: DC

## 2024-01-25 NOTE — Progress Notes (Signed)
   SUBJECTIVE:   CHIEF COMPLAINT / HPI:  Discussed the use of AI scribe software for clinical note transcription with the patient, who gave verbal consent to proceed.  History of Present Illness Virginia [name] is a 53 year old female with a history of UTIs who presents with symptoms of a urinary tract infection.  Lower urinary tract symptoms - Frequent urination and burning sensation at the end of urination, onset last night while at work - Mild abdominal discomfort, especially after urination - No fever  History of urinary tract infection - Previous urinary tract infection approximately six to seven months ago - Has had 1 once per year since starting Jardiance  2 years ago.  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  - No history of leg swelling with amlodipine     PERTINENT  PMH / PSH: HTN, T2DM  OBJECTIVE:  BP (!) 175/84   Pulse 97   Temp 97.9 F (36.6 C) (Oral)   Ht 5' 6 (1.676 m)   Wt 228 lb (103.4 kg)   SpO2 100%   BMI 36.80 kg/m    General: well appearing, in no acute distress CV: RRR, radial pulses equal and palpable, no BLE edema  Resp: Normal work of breathing on room air, CTAB Abd: Soft,  non distended, mildly tender to palpation suprapubically.  No CVA tenderness Neuro: Alert & Oriented x 4   ASSESSMENT/PLAN:   Assessment & Plan UTI symptoms Most likely patient has acute UTI without complication given her dysuria and frequency.  Possibly related to Jardiance  since she  started having UTIs after her starting Jardiance  however given they are happening once per year a low burden.  Patient did says she has had a lot of sugar more recently which could be a cause. - Urinalysis, micro, culture - Empiric treatment with Macrobid  - Patient can try cranberry extract capsules recommended hydration as well. Mixed hyperlipidemia History of hyperlipidemia with diabetes.  On atorvastatin  40 mg. - Lipid panel Essential hypertension, benign Uncontrolled at this time.  However patient has not taken her medication today. - Given how elevated it is we will increase amlodipine  from 5 mg to 10 mg.  Same dose of irbesartan . - BMP - Follow-up in 2 weeks for blood pressure follow-up Need for vaccination Patient received pneumococcal and hep B vaccines today Recommended shingles vaccine at the pharmacy    Areta Saliva, MD [facility]

## 2024-01-25 NOTE — Assessment & Plan Note (Signed)
 Uncontrolled at this time.  However patient has not taken her medication today. - Given how elevated it is we will increase amlodipine  from 5 mg to 10 mg.  Same dose of irbesartan . - BMP - Follow-up in 2 weeks for blood pressure follow-up

## 2024-01-25 NOTE — Assessment & Plan Note (Signed)
 History of hyperlipidemia with diabetes.  On atorvastatin  40 mg. - Lipid panel

## 2024-01-25 NOTE — Patient Instructions (Signed)
 It was wonderful to see you today.  Please bring ALL of your medications with you to every visit.   Today we talked about:  Urinary frequency - I believe you have a UTI based on your preliminary results. I have sent an antibiotic based on what you have responded to in the past. IT is a 5 day course. Let us  know if you feel worse, become feverish or any other symptoms.   For your blood pressure make sure to take your medications every day. I have increased your amlodipine  to 10 mg. Please check your BP daily at the same time and put it in the log below. You have a follow up with Dr. Toma to follow up regarding your blood pressure.   You can get your shingles vaccine at the pharmacy.   Thank you for choosing [facility].   Please call [phone] with any questions about today's appointment.  Areta Saliva, MD  Family Medicine

## 2024-01-26 LAB — BASIC METABOLIC PANEL WITH GFR
BUN/Creatinine Ratio: 19 (ref 9–23)
BUN: 19 mg/dL (ref 6–24)
CO2: 22 mmol/L (ref 20–29)
Calcium: 9.1 mg/dL (ref 8.7–10.2)
Chloride: 105 mmol/L (ref 96–106)
Creatinine, Ser: 0.98 mg/dL (ref 0.57–1.00)
Glucose: 84 mg/dL (ref 70–99)
Potassium: 4.4 mmol/L (ref 3.5–5.2)
Sodium: 141 mmol/L (ref 134–144)
eGFR: 69 mL/min/1.73 (ref >59)

## 2024-01-26 LAB — LIPID PANEL
Chol/HDL Ratio: 2.9 ratio (ref 0.0–4.4)
Cholesterol, Total: 154 mg/dL (ref 100–199)
HDL: 54 mg/dL (ref >39)
LDL Chol Calc (NIH): 74 mg/dL (ref 0–99)
Triglycerides: 152 mg/dL — ABNORMAL HIGH (ref 0–149)
VLDL Cholesterol Cal: 26 mg/dL (ref 5–40)

## 2024-01-26 MED ORDER — EZETIMIBE 10 MG PO TABS: 10.0000 mg | ORAL_TABLET | Freq: Every day | ORAL | 0 refills | Status: DC

## 2024-01-26 NOTE — Telephone Encounter (Signed)
 Called patient to discuss LDL above goal for history of diabetes.  Discussed with Dr. Koval and would recommend increasing statin or adding second agent. Patient says she already gets cramping with 40 mg atorvastatin  and would not like to go up to 80. She is ok with adding ezetimibe instead. Prescribed ezetimibe 10 and scheduled lab visit to follow up cholesterol in 4 weeks. She has follow up with PCP in two weeks when she can discuss any side effects to the ezetimibe. Patient did not have further questions.

## 2024-01-29 LAB — URINE CULTURE

## 2024-02-01 ENCOUNTER — Ambulatory Visit: Admitting: Family Medicine

## 2024-02-01 ENCOUNTER — Encounter: Payer: Self-pay | Admitting: Family Medicine

## 2024-02-01 VITALS — BP 134/81 | HR 88 | Ht 66.0 in | Wt 225.0 lb

## 2024-02-01 DIAGNOSIS — R399 Unspecified symptoms and signs involving the genitourinary system: Secondary | ICD-10-CM

## 2024-02-01 LAB — POCT URINALYSIS DIP (MANUAL ENTRY)
Bilirubin, UA: NEGATIVE
Glucose, UA: >=1000 mg/dL — AB
Ketones, POC UA: NEGATIVE mg/dL
Leukocytes, UA: NEGATIVE
Nitrite, UA: NEGATIVE
Protein Ur, POC: NEGATIVE mg/dL
Spec Grav, UA: 1.015 (ref 1.010–1.025)
Urobilinogen, UA: 0.2 U/dL
pH, UA: 5.5 (ref 5.0–8.0)

## 2024-02-01 LAB — POCT UA - MICROSCOPIC ONLY

## 2024-02-01 NOTE — Progress Notes (Signed)
    SUBJECTIVE:   CHIEF COMPLAINT / HPI:   Dx with UTI 11/10, treated with Macrobid  x5d Completed antibiotic course, had episode of small amount of blood in her urine 2 days ago Has not had any hematuria since then Denies vaginal bleeding, fever, chills, abdominal pain, back pain  PERTINENT  PMH / PSH: Hypertension, diabetes, hyperlipidemia  OBJECTIVE:   BP 134/81   Pulse 88   Ht 5' 6 (1.676 m)   Wt 225 lb (102.1 kg)   SpO2 96%   BMI 36.32 kg/m   General: well appearing, NAD Abdomen: soft, non-tender Back: no cva tenderness  ASSESSMENT/PLAN:   Assessment & Plan UTI symptoms 1 episode of blood in the urine 2 days ago I suspect this for residual irritation from UTI.  Urine dipstick today unremarkable for infection with trace blood.  I am reassured that her symptoms have overall resolved.  Her urine culture grew Klebsiella which was sensitive to Macrobid . Encouraged hydration She has an appointment with her PCP next week, advised her to make that appointment or schedule sooner appointment if she develops any symptoms of her UTI again Strict return precautions discussed Consider discontinuation of jardiance      Virginia Prescott, DO [facility]

## 2024-02-01 NOTE — Patient Instructions (Addendum)
 Good to see you today - Thank you for coming in  Things we discussed today:  Your urine does not show any signs of infection.  Please continue to stay well-hydrated.  If you develop any fever, chills, abdominal pain, back pain, burning when you urinate please let us  know.  Happy Thanksgiving!

## 2024-02-02 ENCOUNTER — Encounter: Payer: Self-pay | Admitting: Family Medicine

## 2024-02-02 DIAGNOSIS — E113591 Type 2 diabetes mellitus with proliferative diabetic retinopathy without macular edema, right eye: Secondary | ICD-10-CM | POA: Insufficient documentation

## 2024-02-08 ENCOUNTER — Other Ambulatory Visit: Payer: Self-pay

## 2024-02-08 ENCOUNTER — Other Ambulatory Visit: Payer: Self-pay | Admitting: Family Medicine

## 2024-02-08 ENCOUNTER — Telehealth: Payer: Self-pay

## 2024-02-08 DIAGNOSIS — E118 Type 2 diabetes mellitus with unspecified complications: Secondary | ICD-10-CM

## 2024-02-08 DIAGNOSIS — I10 Essential (primary) hypertension: Secondary | ICD-10-CM

## 2024-02-08 MED ORDER — OZEMPIC (2 MG/DOSE) 8 MG/3ML ~~LOC~~ SOPN
2.0000 mg | PEN_INJECTOR | SUBCUTANEOUS | 1 refills | Status: DC
Start: 2024-02-08 — End: 2024-02-08

## 2024-02-08 MED ORDER — IRBESARTAN 150 MG PO TABS: 150.0000 mg | ORAL_TABLET | Freq: Every day | ORAL | 0 refills | Status: DC

## 2024-02-08 MED ORDER — OZEMPIC (2 MG/DOSE) 8 MG/3ML ~~LOC~~ SOPN: PEN_INJECTOR | SUBCUTANEOUS | 1 refills | Status: AC

## 2024-02-08 NOTE — Telephone Encounter (Signed)
 Resent prescription with corrected med sig.  Got a notice that prescription quantity has been exceeded, likely due to represcription.  Should not be an issue if only one prescription gets filled.

## 2024-02-08 NOTE — Telephone Encounter (Signed)
 [facility] calls nurse line requesting a new prescription of Semaglutide .   Please review sig and resend to [facility].   The was originally sent to [facility] by mistake.   Will forward to PCP.

## 2024-02-09 ENCOUNTER — Ambulatory Visit: Payer: Self-pay | Admitting: Family Medicine

## 2024-02-09 NOTE — Assessment & Plan Note (Deleted)
 A1c previously rising approximately 1 month ago, 7.1 and above goal. - Continue semaglutide  2 mg weekly - Continue Jardiance  25 mg daily - Continue atorvastatin  40 mg daily - Lipid panel - BMP - Foot exam as above; ***

## 2024-02-09 NOTE — Progress Notes (Deleted)
   SUBJECTIVE:   CHIEF COMPLAINT / HPI:  Virginia [name] is a 53 y.o. female with a pertinent past medical history of T2DM, HTN presenting to the clinic for follow-up of chronic conditions.  Type 2 diabetes mellitus - Prior A1c 7.1 on 10/24 - Home CBGs: *** - Medications: Semaglutide  2 mg weekly, Jardiance  25 mg daily - Hypoglycemia plan: *** - Adherence: *** - Eye exam: UTD - Foot exam: Due today - Microalbumin: UTD, normal - Statin: Atorvastatin  40 mg, ezetimibe  10 mg - No symptoms of hypoglycemia, polyuria, polydipsia, numbness of extremities, foot ulcers/trauma  Hypertension -   today. - Home medications include: Amlodipine  10 mg daily, irbesartan  150 mg daily. - She {Blank single:19197::endorses,does not endorse} taking these medications as prescribed. - {Blank single:19197::Does,Does not} check blood pressure at home, these have ranged ***. - Denies hypotension symptoms including dizziness and orthostatic symptoms. - Denies headache, vision changes, LUQ pain, hematuria, chest pain. - Diet: ***. - Exercise: ***.  Most recent creatinine trend:  Lab Results  Component Value Date   CREATININE 0.98 01/25/2024   CREATININE 0.95 08/27/2023   CREATININE 0.85 07/01/2022    PERTINENT PMH / PSH: T2DM with Hx neuropathy, diabetic retinopathy of right eye, diabetic dermopathy, Charcot foot HTN HLD  *Remainder reviewed in problem list.   OBJECTIVE:   There were no vitals taken for this visit.  General: Age-appropriate, resting comfortably in chair, NAD, alert and at baseline. HEENT:  Head: Normocephalic, atraumatic. No tenderness to percussion over sinuses. Eyes: PERRLA. No conjunctival erythema or scleral injections. Ears: TMs non-bulging and non-erythematous bilaterally. No erythema of external ear canal. No cerumen impaction. Nose: Non-erythematous turbinates. No rhinorrhea. Mouth/Oral: Clear, no tonsillar exudate. MMM. Neck: Supple. No LAD. Cardiovascular:  Regular rate and rhythm. Normal S1/S2. No murmurs, rubs, or gallops appreciated. 2+ radial pulses. Pulmonary: Clear bilaterally to ascultation. No wheezes, crackles, or rhonchi. Normal WOB on room air. No accessory muscle use. Abdominal: No tenderness to deep or light palpation. No rebound or guarding. No HSM. Skin: Warm and dry. Extremities: No peripheral edema bilaterally. Capillary refill <2 seconds.  Diabetic foot exam with monofilament shows intact sensation and discrimination over all toes and plantar surfaces of feet bilaterally.  No ulcers or calluses present.  Diabetic Foot Exam - Simple   No data filed     ASSESSMENT/PLAN:   Assessment & Plan Type 2 diabetes mellitus with diabetic polyneuropathy, without long-term current use of insulin  (HCC) A1c previously rising approximately 1 month ago, 7.1 and above goal. - Continue semaglutide  2 mg weekly - Continue Jardiance  25 mg daily - Continue atorvastatin  40 mg daily - Lipid panel - BMP - Foot exam as above; *** Primary hypertension BP *** today. - Continue losartan  150 mg, amlodipine  10 mg - BMP today  No follow-ups on file.  Casimer Russett Toma, MD [facility]

## 2024-02-09 NOTE — Assessment & Plan Note (Deleted)
 BP *** today. - Continue losartan  150 mg, amlodipine  10 mg - BMP today

## 2024-02-16 ENCOUNTER — Other Ambulatory Visit: Payer: Self-pay

## 2024-02-16 DIAGNOSIS — E1165 Type 2 diabetes mellitus with hyperglycemia: Secondary | ICD-10-CM

## 2024-02-17 MED ORDER — EMPAGLIFLOZIN 25 MG PO TABS: 25.0000 mg | ORAL_TABLET | Freq: Every day | ORAL | 1 refills | Status: AC

## 2024-02-21 ENCOUNTER — Other Ambulatory Visit: Payer: Self-pay | Admitting: Family Medicine

## 2024-02-21 DIAGNOSIS — E785 Hyperlipidemia, unspecified: Secondary | ICD-10-CM

## 2024-02-23 ENCOUNTER — Other Ambulatory Visit: Payer: Self-pay

## 2024-02-23 ENCOUNTER — Ambulatory Visit: Admitting: Family Medicine

## 2024-02-23 VITALS — BP 148/75 | HR 90 | Ht 66.0 in | Wt 226.2 lb

## 2024-02-23 DIAGNOSIS — E11610 Type 2 diabetes mellitus with diabetic neuropathic arthropathy: Secondary | ICD-10-CM

## 2024-02-23 DIAGNOSIS — E11618 Type 2 diabetes mellitus with other diabetic arthropathy: Secondary | ICD-10-CM

## 2024-02-23 DIAGNOSIS — I10 Essential (primary) hypertension: Secondary | ICD-10-CM

## 2024-02-23 DIAGNOSIS — Z23 Encounter for immunization: Secondary | ICD-10-CM

## 2024-02-23 DIAGNOSIS — E782 Mixed hyperlipidemia: Secondary | ICD-10-CM

## 2024-02-23 MED ORDER — BLOOD GLUCOSE MONITOR KIT: PACK | 0 refills | Status: AC

## 2024-02-23 MED ORDER — IRBESARTAN 150 MG PO TABS: 300.0000 mg | ORAL_TABLET | Freq: Every day | ORAL | Status: AC

## 2024-02-23 MED ORDER — COMFORT TOUCH BP CUFF/MEDIUM MISC: 1.0000 | 0 refills | Status: AC | PRN

## 2024-02-23 NOTE — Progress Notes (Signed)
 SUBJECTIVE:   CHIEF COMPLAINT / HPI:  Virginia [name] is a 53 y.o. female with a pertinent past medical history of T2DM, HTN presenting to the clinic for follow-up of chronic conditions.  Type 2 diabetes mellitus - Prior A1c 7.1 on 10/24 - Home CBGs: Not checking - Medications: Semaglutide  2 mg weekly, Jardiance  25 mg daily - Hypoglycemia plan: Verbalized - Adherence: Adequate - Eye exam: UTD, patient has a podiatrist - Foot exam: Due today - Microalbumin: UTD, normal - Statin: Atorvastatin  40 mg, ezetimibe  10 mg  - Patient has missed a few doses of ezetimibe , but now has pill box - No symptoms of hypoglycemia, polyuria, polydipsia, numbness of extremities, foot ulcers/trauma  Hypertension - BP: (!) 148/75 today on recheck. - Home medications include: Amlodipine  10 mg daily, irbesartan  150 mg daily. - She endorses taking these medications as prescribed. - Does check blood pressure at home, these have ranged 160s, but she believes her BP cuff is inaccurate. - Denies hypotension symptoms including dizziness and orthostatic symptoms. - Denies headache, vision changes, LUQ pain, hematuria, chest pain.  Most recent creatinine trend:  Lab Results  Component Value Date   CREATININE 0.98 01/25/2024   CREATININE 0.95 08/27/2023   CREATININE 0.85 07/01/2022   Charcot foot Patient is missing big toes/1st digit on both feet, has Charcot foot of both feet, reports difficulty with balance and has to park far away from her work.  PERTINENT PMH / PSH: T2DM with Hx neuropathy, diabetic retinopathy of right eye, diabetic dermopathy, Charcot foot HTN HLD  *Remainder reviewed in problem list.   OBJECTIVE:   BP (!) 148/75   Pulse 90   Ht 5' 6 (1.676 m)   Wt 226 lb 3.2 oz (102.6 kg)   SpO2 100%   BMI 36.51 kg/m   General: Age-appropriate, resting comfortably in chair, NAD, alert and at baseline. Cardiovascular: Regular rate and rhythm. Normal S1/S2. No murmurs, rubs, or gallops  appreciated. 2+ radial pulses. Pulmonary: Clear bilaterally to ascultation. No wheezes, crackles, or rhonchi. Normal WOB on room air. No accessory muscle use. Skin: Warm and dry. Extremities: Charcot foot of left foot, amputated great toe.  No peripheral edema bilaterally. Capillary refill <2 seconds. MSK: Mildly unsteady, slow gait.   ASSESSMENT/PLAN:   Assessment & Plan HYPERTENSION, BENIGN SYSTEMIC BP above goal today. - Increase irbesartan  to 300 mg daily - BMP today Type 2 diabetes mellitus with other diabetic arthropathy, without long-term current use of insulin  (HCC) Most recent A1c 7.1 on 01/08/24.  Patient reports intermittently feeling hypoglycemic, does not have glucose monitor at home. - Recheck A1c in 1-2 months - Continue semaglutide  2 mg weekly, Jardiance  25 mg daily - Paper prescription for glucose testing kit provided in the event of hypoglycemia sensation - Reviewed hypoglycemia action plan of drinking juice or eating hard candy if symptomatic - Diabetic foot exam previously completed by podiatry on 09/11/23 Mixed hyperlipidemia Recently started on ezetimibe  10 mg. - Lipid panel today, did note prior lipid panel 4 weeks ago with LDL 74 after visit was concluded - Continue ezetimibe  10 mg and atorvastatin  40 mg Charcot foot due to diabetes mellitus (HCC) Left foot.  Limits patient's balance.  Also with amputation of great toe.  Patient requests disability placard.  She cannot ambulate 200 feet without stopping and has an orthopedic limitation to balance. - Signed form for permanent disability placard Need for hepatitis B vaccination Prior vaccine 11/10, never previously administered. - Hepatitis B second vaccine in series administered  Return in about 2 weeks (around 03/08/2024) for BMP, BP follow up.  Raygan Skarda Toma, MD [facility]

## 2024-02-23 NOTE — Assessment & Plan Note (Signed)
 Recently started on ezetimibe  10 mg. - Lipid panel today, did note prior lipid panel 4 weeks ago with LDL 74 after visit was concluded - Continue ezetimibe  10 mg and atorvastatin  40 mg

## 2024-02-23 NOTE — Assessment & Plan Note (Signed)
 BP above goal today. - Increase irbesartan  to 300 mg daily - BMP today

## 2024-02-23 NOTE — Assessment & Plan Note (Signed)
 Left foot.  Limits patient's balance.  Also with amputation of great toe.  Patient requests disability placard.  She cannot ambulate 200 feet without stopping and has an orthopedic limitation to balance. - Signed form for permanent disability placard

## 2024-02-23 NOTE — Assessment & Plan Note (Signed)
 Most recent A1c 7.1 on 01/08/24.  Patient reports intermittently feeling hypoglycemic, does not have glucose monitor at home. - Recheck A1c in 1-2 months - Continue semaglutide  2 mg weekly, Jardiance  25 mg daily - Paper prescription for glucose testing kit provided in the event of hypoglycemia sensation - Reviewed hypoglycemia action plan of drinking juice or eating hard candy if symptomatic - Diabetic foot exam previously completed by podiatry on 09/11/23

## 2024-02-23 NOTE — Patient Instructions (Addendum)
 It was great to see you today! Thank you for choosing [facility] for your primary care.  Today we addressed: 1. Blood pressure Your blood pressure is a bit high today.  I would like to increase your blood pressure medicine.  I will prescribe you irbesartan  300 mg, increased from 150 mg before.  You can take 2 of your irbesartan  pills you have leftover at a time until you run out, then you can start the 300 mg dose.  2. Diabetes I have given you a paper prescription for glucose monitoring kit.  You do not need to check your blood sugar regularly, but if you are feeling like it is getting low, please check it.  Please let us  know if this is not covered by insurance.  3. Disability placard I have filled out the paperwork for you and you can take it to the [employer].  We are checking some labs today, including cholesterol, metabolic panel.  You will get a MyChart message or a letter if results are normal. Otherwise, you will get a call from us .  You should return to our clinic in 2 weeks to recheck your labs and for blood pressure follow up.  Thank you for coming to see us  at [facility] and for the opportunity to care for you! Elyse Prevo, MD 02/23/2024, 11:21 AM

## 2024-02-24 ENCOUNTER — Ambulatory Visit: Payer: Self-pay | Admitting: Family Medicine

## 2024-02-24 LAB — LIPID PANEL
Chol/HDL Ratio: 2.1 ratio (ref 0.0–4.4)
Cholesterol, Total: 105 mg/dL (ref 100–199)
HDL: 51 mg/dL (ref >39)
LDL Chol Calc (NIH): 42 mg/dL (ref 0–99)
Triglycerides: 52 mg/dL (ref 0–149)
VLDL Cholesterol Cal: 12 mg/dL (ref 5–40)

## 2024-02-24 LAB — BASIC METABOLIC PANEL WITH GFR
BUN/Creatinine Ratio: 19 (ref 9–23)
BUN: 15 mg/dL (ref 6–24)
CO2: 24 mmol/L (ref 20–29)
Calcium: 9.4 mg/dL (ref 8.7–10.2)
Chloride: 106 mmol/L (ref 96–106)
Creatinine, Ser: 0.8 mg/dL (ref 0.57–1.00)
Glucose: 102 mg/dL — ABNORMAL HIGH (ref 70–99)
Potassium: 4.2 mmol/L (ref 3.5–5.2)
Sodium: 142 mmol/L (ref 134–144)
eGFR: 88 mL/min/1.73 (ref >59)

## 2024-02-26 ENCOUNTER — Telehealth: Payer: Self-pay

## 2024-02-26 NOTE — Telephone Encounter (Signed)
 Tried reaching out to patient to let her know orthotics are in. Unable to reach patient, LVM requesting she call us  back to schedule an appointment to [facility].

## 2024-03-04 ENCOUNTER — Ambulatory Visit: Admitting: Family Medicine

## 2024-04-01 ENCOUNTER — Encounter: Payer: Self-pay | Admitting: Family Medicine

## 2024-04-01 ENCOUNTER — Ambulatory Visit: Admitting: Family Medicine

## 2024-04-01 VITALS — BP 139/74 | HR 88 | Wt 223.4 lb

## 2024-04-01 DIAGNOSIS — N390 Urinary tract infection, site not specified: Secondary | ICD-10-CM | POA: Diagnosis not present

## 2024-04-01 LAB — POCT URINALYSIS DIP (CLINITEK)
Bilirubin, UA: NEGATIVE
Glucose, UA: >=1000 mg/dL — AB
Ketones, POC UA: NEGATIVE mg/dL
Nitrite, UA: POSITIVE — AB
POC PROTEIN,UA: 30 — AB
Spec Grav, UA: 1.02
Urobilinogen, UA: 0.2 U/dL
pH, UA: 5.5

## 2024-04-01 LAB — POCT UA - MICROSCOPIC ONLY: WBC, Ur, HPF, POC: >20

## 2024-04-01 MED ORDER — NITROFURANTOIN MONOHYD MACRO 100 MG PO CAPS: 100.0000 mg | ORAL_CAPSULE | Freq: Two times a day (BID) | ORAL | 0 refills | Status: AC

## 2024-04-01 NOTE — Patient Instructions (Addendum)
 It was great to see you today! Thank you for choosing [facility] for your primary care. Virginia [name] was seen for urinary symptoms.  Please bring ALL of your medications with you to every visit.   Today we addressed: UTI - Recently treated with Macrobid  since bacteria was sensitive to this will retreat with Macrobid  at this time. Urine culture will be performed to ensure we are treating you with the right antibiotic. Try to stay hydrated and use the bathroom more regularly instead of holding your pee.  We are checking some labs today. I will send you a MyChart message with your results, per your preference. If you do not hear about your labs in the next 2 weeks, please call the office.   You should return to our clinic Return if symptoms worsen or fail to improve. Please arrive 15 minutes before your appointment to ensure smooth check in process.  We appreciate your efforts in making this happen.  Thank you for allowing me to participate in your care, Kathrine Melena, DO 04/01/2024, 10:45 AM PGY-2, [facility]

## 2024-04-01 NOTE — Progress Notes (Signed)
" ° ° °  SUBJECTIVE:   CHIEF COMPLAINT / HPI:   Urinary Concern - Cloudy urine, no buring, increased frequency, and urgency - Symptoms started Wednesday - Drinks about 40 oz - Reports good hygiene - Flow of urine has weakened, feels like she doesn't completely empty; 2 pregnancies - Reports holding her urine throughout the day at work and not using the bathroom regularly  PERTINENT  PMH / PSH: UTIs, T2DM, HTN  OBJECTIVE:   BP 139/74   Pulse 88   Wt 223 lb 6.4 oz (101.3 kg)   SpO2 100%   BMI 36.06 kg/m   General: Awake and Alert in NAD HEENT: NCAT. Sclera anicteric. No rhinorrhea. Cardiovascular: RRR. No M/R/G Respiratory: CTAB, normal WOB on RA. No wheezing, crackles, rhonchi, or diminished breath sounds. Abdomen: Soft, non-tender, non-distended. Bowel sounds normoactive. Extremities: Able to move all extremities. No BLE edema, no deformities or significant joint findings. Skin: Warm and dry. No abrasions or rashes noted. Neuro: A&Ox3. No focal neurological deficits.  ASSESSMENT/PLAN:   Assessment & Plan UTI (urinary tract infection), uncomplicated UA performed with symptoms of UTI present and demonstrated glucosuria, small hematuria, proteinuria, positive for nitrites and leukocytes.  Microscopy with few bacteria and WBCs.  Reviewed previous urine culture and patient was found to have Klebsiella pneumonia UTI 2 months ago which was resistant to tetracycline, Bactrim, and ampicillin.  Treated with nitrofurantoin  at the time. - Prescribed nitrofurantoin  100 mg BID for 5 days - Obtaining urine culture to confirm bacteria and sensitivity to antibiotic - Advised follow up if UTIs become a persistent issue - Discussed that patient could discontinue her Jardiance  for the time being and restart after treatment as this could aggravate her symptoms and cause recurrent UTIs.  Advised f/u with PCP to consider discontinuing this in the future, patient expressed understanding.   Kathrine Melena, DO [facility] "

## 2024-04-05 ENCOUNTER — Other Ambulatory Visit: Payer: Self-pay | Admitting: Family Medicine

## 2024-04-05 ENCOUNTER — Ambulatory Visit: Payer: Self-pay | Admitting: Family Medicine

## 2024-04-05 DIAGNOSIS — I10 Essential (primary) hypertension: Secondary | ICD-10-CM

## 2024-04-05 LAB — URINE CULTURE

## 2024-04-10 ENCOUNTER — Other Ambulatory Visit: Payer: Self-pay | Admitting: Family Medicine

## 2024-10-28 ENCOUNTER — Other Ambulatory Visit (HOSPITAL_COMMUNITY)
